# Patient Record
Sex: Male | Born: 1960 | Race: White | Hispanic: No | Marital: Married | Smoking: Current every day smoker
Health system: Southern US, Community
[De-identification: ages and names within clinical notes are randomized; demographics above are authoritative.]

## PROBLEM LIST (undated history)

## (undated) DIAGNOSIS — I10 Essential (primary) hypertension: Secondary | ICD-10-CM

## (undated) DIAGNOSIS — R079 Chest pain, unspecified: Secondary | ICD-10-CM

## (undated) DIAGNOSIS — J9620 Acute and chronic respiratory failure, unspecified whether with hypoxia or hypercapnia: Secondary | ICD-10-CM

## (undated) DIAGNOSIS — M1990 Unspecified osteoarthritis, unspecified site: Secondary | ICD-10-CM

## (undated) DIAGNOSIS — F419 Anxiety disorder, unspecified: Secondary | ICD-10-CM

## (undated) DIAGNOSIS — J45909 Unspecified asthma, uncomplicated: Secondary | ICD-10-CM

## (undated) DIAGNOSIS — F32A Depression, unspecified: Secondary | ICD-10-CM

## (undated) DIAGNOSIS — M542 Cervicalgia: Secondary | ICD-10-CM

## (undated) DIAGNOSIS — F329 Major depressive disorder, single episode, unspecified: Secondary | ICD-10-CM

## (undated) DIAGNOSIS — M545 Low back pain, unspecified: Secondary | ICD-10-CM

## (undated) DIAGNOSIS — E7800 Pure hypercholesterolemia, unspecified: Secondary | ICD-10-CM

## (undated) DIAGNOSIS — M75122 Complete rotator cuff tear or rupture of left shoulder, not specified as traumatic: Secondary | ICD-10-CM

## (undated) DIAGNOSIS — Z972 Presence of dental prosthetic device (complete) (partial): Secondary | ICD-10-CM

## (undated) DIAGNOSIS — M722 Plantar fascial fibromatosis: Secondary | ICD-10-CM

## (undated) DIAGNOSIS — K219 Gastro-esophageal reflux disease without esophagitis: Secondary | ICD-10-CM

## (undated) DIAGNOSIS — R05 Cough: Secondary | ICD-10-CM

## (undated) DIAGNOSIS — J449 Chronic obstructive pulmonary disease, unspecified: Secondary | ICD-10-CM

## (undated) DIAGNOSIS — K429 Umbilical hernia without obstruction or gangrene: Secondary | ICD-10-CM

## (undated) DIAGNOSIS — G8929 Other chronic pain: Secondary | ICD-10-CM

## (undated) DIAGNOSIS — R059 Cough, unspecified: Secondary | ICD-10-CM

## (undated) HISTORY — DX: Major depressive disorder, single episode, unspecified: F32.9

## (undated) HISTORY — PX: CERVICAL DISCECTOMY: SHX98

## (undated) HISTORY — DX: Gastro-esophageal reflux disease without esophagitis: K21.9

## (undated) HISTORY — DX: Acute and chronic respiratory failure, unspecified whether with hypoxia or hypercapnia: J96.20

## (undated) HISTORY — DX: Chest pain, unspecified: R07.9

## (undated) HISTORY — DX: Cough: R05

## (undated) HISTORY — PX: COLONOSCOPY: SHX174

## (undated) HISTORY — PX: HERNIA REPAIR: SHX51

## (undated) HISTORY — PX: GROIN EXPLORATION: SHX1713

## (undated) HISTORY — DX: Umbilical hernia without obstruction or gangrene: K42.9

## (undated) HISTORY — DX: Cervicalgia: M54.2

## (undated) HISTORY — DX: Essential (primary) hypertension: I10

## (undated) HISTORY — DX: Cough, unspecified: R05.9

## (undated) HISTORY — DX: Plantar fascial fibromatosis: M72.2

## (undated) HISTORY — DX: Depression, unspecified: F32.A

---

## 2001-02-25 ENCOUNTER — Encounter: Payer: Self-pay | Admitting: Emergency Medicine

## 2001-02-25 ENCOUNTER — Emergency Department (HOSPITAL_COMMUNITY): Admission: EM | Admit: 2001-02-25 | Discharge: 2001-02-25 | Payer: Self-pay | Admitting: Emergency Medicine

## 2001-03-06 ENCOUNTER — Encounter: Payer: Self-pay | Admitting: Sports Medicine

## 2001-03-06 ENCOUNTER — Ambulatory Visit (HOSPITAL_COMMUNITY): Admission: RE | Admit: 2001-03-06 | Discharge: 2001-03-06 | Payer: Self-pay | Admitting: Sports Medicine

## 2001-03-12 ENCOUNTER — Encounter: Payer: Self-pay | Admitting: Neurosurgery

## 2001-03-12 ENCOUNTER — Ambulatory Visit (HOSPITAL_COMMUNITY): Admission: RE | Admit: 2001-03-12 | Discharge: 2001-03-13 | Payer: Self-pay | Admitting: Neurosurgery

## 2003-02-05 ENCOUNTER — Emergency Department (HOSPITAL_COMMUNITY): Admission: EM | Admit: 2003-02-05 | Discharge: 2003-02-05 | Payer: Self-pay | Admitting: Emergency Medicine

## 2003-12-22 ENCOUNTER — Emergency Department (HOSPITAL_COMMUNITY): Admission: EM | Admit: 2003-12-22 | Discharge: 2003-12-22 | Payer: Self-pay | Admitting: Family Medicine

## 2003-12-24 ENCOUNTER — Emergency Department (HOSPITAL_COMMUNITY): Admission: EM | Admit: 2003-12-24 | Discharge: 2003-12-24 | Payer: Self-pay | Admitting: Emergency Medicine

## 2005-06-05 ENCOUNTER — Emergency Department (HOSPITAL_COMMUNITY): Admission: EM | Admit: 2005-06-05 | Discharge: 2005-06-05 | Payer: Self-pay | Admitting: Emergency Medicine

## 2005-06-28 ENCOUNTER — Emergency Department (HOSPITAL_COMMUNITY): Admission: EM | Admit: 2005-06-28 | Discharge: 2005-06-28 | Payer: Self-pay | Admitting: Family Medicine

## 2006-10-02 ENCOUNTER — Emergency Department (HOSPITAL_COMMUNITY): Admission: EM | Admit: 2006-10-02 | Discharge: 2006-10-02 | Payer: Self-pay | Admitting: Family Medicine

## 2006-10-19 ENCOUNTER — Emergency Department (HOSPITAL_COMMUNITY): Admission: EM | Admit: 2006-10-19 | Discharge: 2006-10-19 | Payer: Self-pay | Admitting: Family Medicine

## 2008-07-24 ENCOUNTER — Emergency Department (HOSPITAL_COMMUNITY): Admission: EM | Admit: 2008-07-24 | Discharge: 2008-07-24 | Payer: Self-pay | Admitting: Family Medicine

## 2009-01-17 ENCOUNTER — Emergency Department (HOSPITAL_COMMUNITY): Admission: EM | Admit: 2009-01-17 | Discharge: 2009-01-17 | Payer: Self-pay | Admitting: Emergency Medicine

## 2010-02-04 ENCOUNTER — Emergency Department (HOSPITAL_COMMUNITY)
Admission: EM | Admit: 2010-02-04 | Discharge: 2010-02-04 | Payer: Self-pay | Source: Home / Self Care | Admitting: Emergency Medicine

## 2010-02-04 LAB — URINALYSIS, ROUTINE W REFLEX MICROSCOPIC
Bilirubin Urine: NEGATIVE
Hemoglobin, Urine: NEGATIVE
Ketones, ur: NEGATIVE mg/dL
Nitrite: NEGATIVE
Protein, ur: NEGATIVE mg/dL
Specific Gravity, Urine: 1.01 (ref 1.005–1.030)
Urine Glucose, Fasting: NEGATIVE mg/dL
Urobilinogen, UA: 0.2 mg/dL (ref 0.0–1.0)
pH: 6 (ref 5.0–8.0)

## 2010-02-04 LAB — CBC
HCT: 49.8 % (ref 39.0–52.0)
Hemoglobin: 17.2 g/dL — ABNORMAL HIGH (ref 13.0–17.0)
MCH: 31.9 pg (ref 26.0–34.0)
MCHC: 34.5 g/dL (ref 30.0–36.0)
MCV: 92.4 fL (ref 78.0–100.0)
Platelets: 200 10*3/uL (ref 150–400)
RBC: 5.39 MIL/uL (ref 4.22–5.81)
RDW: 12.7 % (ref 11.5–15.5)
WBC: 8.6 10*3/uL (ref 4.0–10.5)

## 2010-02-04 LAB — BASIC METABOLIC PANEL
BUN: 13 mg/dL (ref 6–23)
CO2: 27 mEq/L (ref 19–32)
Calcium: 9.8 mg/dL (ref 8.4–10.5)
Chloride: 102 mEq/L (ref 96–112)
Creatinine, Ser: 0.93 mg/dL (ref 0.4–1.5)
GFR calc Af Amer: >60 mL/min (ref >60)
GFR calc non Af Amer: >60 mL/min (ref >60)
Glucose, Bld: 76 mg/dL (ref 70–99)
Potassium: 4.4 mEq/L (ref 3.5–5.1)
Sodium: 137 mEq/L (ref 135–145)

## 2010-02-04 LAB — DIFFERENTIAL
Basophils Absolute: 0 10*3/uL (ref 0.0–0.1)
Basophils Relative: 0 % (ref 0–1)
Eosinophils Absolute: 0.3 10*3/uL (ref 0.0–0.7)
Eosinophils Relative: 4 % (ref 0–5)
Lymphocytes Relative: 31 % (ref 12–46)
Lymphs Abs: 2.7 10*3/uL (ref 0.7–4.0)
Monocytes Absolute: 0.9 10*3/uL (ref 0.1–1.0)
Monocytes Relative: 10 % (ref 3–12)
Neutro Abs: 4.7 10*3/uL (ref 1.7–7.7)
Neutrophils Relative %: 55 % (ref 43–77)

## 2010-02-04 LAB — POCT CARDIAC MARKERS
CKMB, poc: 1.9 ng/mL (ref 1.0–8.0)
Myoglobin, poc: 64 ng/mL (ref 12–200)
Troponin i, poc: <0.05 ng/mL (ref 0.00–0.09)

## 2010-02-15 DIAGNOSIS — M549 Dorsalgia, unspecified: Secondary | ICD-10-CM | POA: Insufficient documentation

## 2010-02-15 DIAGNOSIS — R079 Chest pain, unspecified: Secondary | ICD-10-CM | POA: Insufficient documentation

## 2010-02-16 ENCOUNTER — Encounter (INDEPENDENT_AMBULATORY_CARE_PROVIDER_SITE_OTHER): Payer: Self-pay | Admitting: *Deleted

## 2010-02-16 ENCOUNTER — Ambulatory Visit
Admission: RE | Admit: 2010-02-16 | Discharge: 2010-02-16 | Payer: Self-pay | Source: Home / Self Care | Attending: Cardiovascular Disease | Admitting: Cardiovascular Disease

## 2010-02-16 ENCOUNTER — Encounter: Payer: Self-pay | Admitting: Cardiovascular Disease

## 2010-02-16 DIAGNOSIS — J9620 Acute and chronic respiratory failure, unspecified whether with hypoxia or hypercapnia: Secondary | ICD-10-CM | POA: Insufficient documentation

## 2010-02-16 DIAGNOSIS — Z720 Tobacco use: Secondary | ICD-10-CM | POA: Insufficient documentation

## 2010-02-17 ENCOUNTER — Telehealth (INDEPENDENT_AMBULATORY_CARE_PROVIDER_SITE_OTHER): Payer: Self-pay | Admitting: *Deleted

## 2010-02-19 ENCOUNTER — Ambulatory Visit (HOSPITAL_COMMUNITY)
Admission: RE | Admit: 2010-02-19 | Discharge: 2010-02-19 | Payer: Self-pay | Source: Home / Self Care | Attending: Cardiovascular Disease | Admitting: Cardiovascular Disease

## 2010-02-22 ENCOUNTER — Telehealth: Payer: Self-pay | Admitting: Cardiovascular Disease

## 2010-03-04 NOTE — Miscellaneous (Signed)
Summary: Orders Update  Clinical Lists Changes  Orders: Added new Referral order of Cardiac CTA (Cardiac CTA) - Signed 

## 2010-03-04 NOTE — Progress Notes (Signed)
  DDS Request received sent to [facility]  February 17, 2010 10:35 AM

## 2010-03-04 NOTE — Progress Notes (Signed)
Summary: pt rtn call  Phone Note Call from Patient Call back at Home Phone [phone]   Caller: Patient Reason for Call: Talk to Nurse, Talk to Doctor Summary of Call: pt rtn call Initial call taken by: Omer Jack,  February 22, 2010 10:41 AM  Follow-up for Phone Call        Pt. states he is returning a call. Some one from this office had called him this am.  Cardiac Ct results given. Pt. states he still is having some pain on his chest, also he continues to smoke. Pt. was made  aware of the nicotine side effects. Pt. states will continue to F/u with Cardiologist. Follow-up by: Ollen Gross, RN, BSN,  February 22, 2010 11:35 AM

## 2010-03-04 NOTE — Assessment & Plan Note (Signed)
Summary: np3/chest pain/selfpay/d/c wl 02/04/10/mj   CC:  pt complains of back and chest pain also unable to sleep..pt states hes not on any meds.  History of Present Illness: 50 yo referred by [facility] ER for SSCP.  Seen 1/5 and R/O with negative enzymes, NAD on CXR and ECG with no acute changes.  Reviewed visit.  Continues to have aytpical pain left sided persistant.  Not always exertional.  Associated with some SOB.  Smokes 1ppd for years.  Discussed smoking cessation for less than 10 minutes and relationship between it and vascular disease.  Poor medical F/U with no primary.  Currently not working and sedentary. Discussed Promise Trial with him.  Willing to be randomized to stress echo or CT  Current Problems (verified): 1)  Chest Pain  (ICD-786.50) 2)  Back Pain  (ICD-724.5)  Current Medications (verified): 1)  None  Allergies (verified): No Known Drug Allergies  Past History:  Past Medical History: Last updated: 02/15/2010 CHEST PAIN BACK PAIN   Past Surgical History: Last updated: 02/15/2010 cervical disk surgery  Family History: Last updated: 02/16/2010  CAD  premature fathers side  Social History: Last updated: 02/16/2010  former drug abuse, cigarette smoker  Unemployed Lives with wife Sedentary  Family History:  CAD  premature fathers side  Social History:  former drug abuse, cigarette smoker  Unemployed Lives with wife Sedentary  Review of Systems       Denies fever, malais, weight loss, blurry vision, decreased visual acuity, cough, sputum,, hemoptysis, pleuritic pain, palpitaitons, heartburn, abdominal pain, melena, lower extremity edema, claudication, or rash.   Vital Signs:  Patient profile:   50 year old male Height:      64 inches Weight:      168 pounds BMI:     28.94 Pulse rate:   78 / minute Resp:     14 per minute BP sitting:   130 / 80  (left arm)  Vitals Entered By: Kem Parkinson (February 16, 2010 3:06 PM)  Physical Exam  General:   Affect appropriate Healthy:  appears stated age HEENT: normal Neck supple with no adenopathy JVP normal no bruits no thyromegaly Lungs clear with no wheezing and good diaphragmatic motion Heart:  S1/S2 no murmur,rub, gallop or click PMI normal Abdomen: benighn, BS positve, no tenderness, no AAA no bruit.  No HSM or HJR Distal pulses intact with no bruits No edema Neuro non-focal Skin warm and dry    Impression & Recommendations:  Problem # 1:  CHEST PAIN (ICD-786.50) Promise trial.  CT vs stress echo.  ECG normal CRF primarily smoking  Problem # 2:  SMOKER (ICD-305.1) No motivation to quit.  Offerred Welbutrin but declined  Problem # 3:  ACUTE AND CHRONIC RESPIRATORY FAILURE (ICD-518.84) Clinical COPD.  No nodules or CA on CXR in ER.  Discussed long term effects of smoking  Should have yearly CXR

## 2010-03-15 ENCOUNTER — Emergency Department (HOSPITAL_COMMUNITY): Payer: Self-pay

## 2010-03-15 ENCOUNTER — Emergency Department (HOSPITAL_COMMUNITY)
Admission: EM | Admit: 2010-03-15 | Discharge: 2010-03-15 | Disposition: A | Discharge status: Home / Self Care | Payer: Self-pay | Attending: Emergency Medicine | Admitting: Emergency Medicine

## 2010-03-15 DIAGNOSIS — R0609 Other forms of dyspnea: Secondary | ICD-10-CM | POA: Insufficient documentation

## 2010-03-15 DIAGNOSIS — R51 Headache: Secondary | ICD-10-CM | POA: Insufficient documentation

## 2010-03-15 DIAGNOSIS — R509 Fever, unspecified: Secondary | ICD-10-CM | POA: Insufficient documentation

## 2010-03-15 DIAGNOSIS — R071 Chest pain on breathing: Secondary | ICD-10-CM | POA: Insufficient documentation

## 2010-03-15 DIAGNOSIS — R0989 Other specified symptoms and signs involving the circulatory and respiratory systems: Secondary | ICD-10-CM | POA: Insufficient documentation

## 2010-03-15 LAB — BASIC METABOLIC PANEL
BUN: 12 mg/dL (ref 6–23)
CO2: 28 mEq/L (ref 19–32)
Calcium: 9.3 mg/dL (ref 8.4–10.5)
Chloride: 105 mEq/L (ref 96–112)
Creatinine, Ser: 0.98 mg/dL (ref 0.4–1.5)
GFR calc Af Amer: >60 mL/min (ref >60)
GFR calc non Af Amer: >60 mL/min (ref >60)
Glucose, Bld: 109 mg/dL — ABNORMAL HIGH (ref 70–99)
Potassium: 4.2 mEq/L (ref 3.5–5.1)
Sodium: 140 mEq/L (ref 135–145)

## 2010-03-15 LAB — DIFFERENTIAL
Basophils Absolute: 0 10*3/uL (ref 0.0–0.1)
Basophils Relative: 1 % (ref 0–1)
Eosinophils Absolute: 0.4 10*3/uL (ref 0.0–0.7)
Eosinophils Relative: 5 % (ref 0–5)
Lymphocytes Relative: 30 % (ref 12–46)
Lymphs Abs: 2.6 10*3/uL (ref 0.7–4.0)
Monocytes Absolute: 0.9 10*3/uL (ref 0.1–1.0)
Monocytes Relative: 10 % (ref 3–12)
Neutro Abs: 4.7 10*3/uL (ref 1.7–7.7)
Neutrophils Relative %: 55 % (ref 43–77)

## 2010-03-15 LAB — CBC
HCT: 46.9 % (ref 39.0–52.0)
Hemoglobin: 16.7 g/dL (ref 13.0–17.0)
MCH: 32.1 pg (ref 26.0–34.0)
MCHC: 35.6 g/dL (ref 30.0–36.0)
MCV: 90.2 fL (ref 78.0–100.0)
Platelets: 186 10*3/uL (ref 150–400)
RBC: 5.2 MIL/uL (ref 4.22–5.81)
RDW: 12.7 % (ref 11.5–15.5)
WBC: 8.6 10*3/uL (ref 4.0–10.5)

## 2010-03-15 LAB — D-DIMER, QUANTITATIVE: D-Dimer, Quant: 0.24 ug/mL-FEU (ref 0.00–0.48)

## 2010-03-15 LAB — LIPASE, BLOOD: Lipase: 21 U/L (ref 11–59)

## 2010-05-03 LAB — LIPASE, BLOOD: Lipase: 22 U/L (ref 11–59)

## 2010-05-03 LAB — COMPREHENSIVE METABOLIC PANEL
ALT: 35 U/L (ref 0–53)
AST: 29 U/L (ref 0–37)
Albumin: 4.1 g/dL (ref 3.5–5.2)
Alkaline Phosphatase: 59 U/L (ref 39–117)
BUN: 10 mg/dL (ref 6–23)
CO2: 28 mEq/L (ref 19–32)
Calcium: 9.2 mg/dL (ref 8.4–10.5)
Chloride: 102 mEq/L (ref 96–112)
Creatinine, Ser: 0.96 mg/dL (ref 0.4–1.5)
GFR calc Af Amer: >60 mL/min (ref >60)
GFR calc non Af Amer: >60 mL/min (ref >60)
Glucose, Bld: 110 mg/dL — ABNORMAL HIGH (ref 70–99)
Potassium: 3.9 mEq/L (ref 3.5–5.1)
Sodium: 137 mEq/L (ref 135–145)
Total Bilirubin: 0.6 mg/dL (ref 0.3–1.2)
Total Protein: 6.7 g/dL (ref 6.0–8.3)

## 2010-05-03 LAB — POCT CARDIAC MARKERS
CKMB, poc: 1.1 ng/mL (ref 1.0–8.0)
Myoglobin, poc: 53.4 ng/mL (ref 12–200)
Troponin i, poc: <0.05 ng/mL (ref 0.00–0.09)

## 2010-05-03 LAB — CBC
HCT: 46.6 % (ref 39.0–52.0)
Hemoglobin: 16.2 g/dL (ref 13.0–17.0)
MCHC: 34.8 g/dL (ref 30.0–36.0)
MCV: 93.4 fL (ref 78.0–100.0)
Platelets: 187 10*3/uL (ref 150–400)
RBC: 4.99 MIL/uL (ref 4.22–5.81)
RDW: 13.7 % (ref 11.5–15.5)
WBC: 7.4 10*3/uL (ref 4.0–10.5)

## 2010-05-03 LAB — DIFFERENTIAL
Basophils Absolute: 0 10*3/uL (ref 0.0–0.1)
Basophils Relative: 1 % (ref 0–1)
Eosinophils Absolute: 0.3 10*3/uL (ref 0.0–0.7)
Eosinophils Relative: 3 % (ref 0–5)
Lymphocytes Relative: 29 % (ref 12–46)
Lymphs Abs: 2.2 10*3/uL (ref 0.7–4.0)
Monocytes Absolute: 0.9 10*3/uL (ref 0.1–1.0)
Monocytes Relative: 12 % (ref 3–12)
Neutro Abs: 4.1 10*3/uL (ref 1.7–7.7)
Neutrophils Relative %: 55 % (ref 43–77)

## 2010-05-03 LAB — URINALYSIS, ROUTINE W REFLEX MICROSCOPIC
Bilirubin Urine: NEGATIVE
Ketones, ur: NEGATIVE mg/dL
Nitrite: NEGATIVE
Protein, ur: NEGATIVE mg/dL
pH: 6 (ref 5.0–8.0)

## 2010-06-18 NOTE — H&P (Signed)
[place]. [facility]  Patient:    Vernon Carpenter, Vernon Carpenter Visit Number: [id number] MRN: [id number]          Service Type: DSU Location: [facility] [id number] Attending Physician:  Coletta Memos Dictated by:   Mena Goes. Franky Macho, M.D. Admit Date:  03/12/2001                           History and Physical  ADMITTING DIAGNOSIS:  Displaced disk left C6-7, left C7 radiculopathy.  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  Over the last two to three weeks he has had significant pain in his left upper extremity and shoulder.  It appeared the pain started after a knife broke while he was working on some drywall.  He has had no bowel or bladder dysfunction.  Does have some weakness in his arms.  He is currently taking prednisone and Vicodin for pain.  PAST MEDICAL HISTORY:  Excellent.  He has had no previous operations.  FAMILY HISTORY:  Not provided.  SOCIAL HISTORY:  He does smoke one pack of cigarettes a day.  Does not drink alcohol or use illicit drugs.  He is 5 feet 4 inches tall and weighs 115 pounds.  REVIEW OF SYSTEMS:  Positive for arm and shoulder pain.  He denied constitutional, eye, ear, nose, throat, mouth, cardiovascular, respiratory, gastrointestinal, genitourinary, skin, neurological, psychiatric, endocrine, and hematologic problems.  PHYSICAL EXAMINATION:  VITAL SIGNS:  Pulse 72.  GENERAL:  He is alert and oriented x 4 and answers all questions appropriately.  Memory, language, attention span, and fund of knowledge are normal.  He is well-kempt and in no distress.  NEUROLOGIC:  Pupils are equal, round and reactive to light.  He has full extraocular movements.  Normal funduscopic exam.  Visual fields are normal. He has symmetric facial sensation and movements.  Hearing intact to finger rub and voice bilaterally.  Uvula elevates in the midline.  Shoulder shrug is normal.  Tongue protrudes in the midline.  He has 5/5  strength in the upper extremities with the exception of 4/5 strength in the left triceps and left wrist extensors.  Proprioception and pinprick are intact.  No clonus. Reflexes 2+ at the biceps, triceps, brachioradialis, knees, and ankles.  Toes are downgoing to plantar stimulation and he is without clonus.  Romberg test is negative and his gait is normal.  He can tandem walk easily.  NECK:  There are no cervical masses or bruits appreciated.  LUNGS:  Lung fields are clear.  HEART:  Regular rhythm and rate, no murmurs or rubs.  Pulses are good at the wrists and feet bilaterally.  LABORATORY DATA:  MRI shows a large herniated disk at C6-7 on the left side causing cord distortion and closing off the neural foramen.  Alignment shows some slight loss of lordosis and there are no abnormalities in the paraspinous soft tissue.  IMPRESSION:  Vernon Carpenter is a young man who works in [employer].  He has not been able to work secondary to the significant amount of pain he is in.  While he was in my office he could barely leave his left arm unheld, as it hurts him to let it dangle.  I think he would be much better with operative removal of the disk and anterior plating.  Risks of the procedure including bleeding, infection, no pain relief, bowel or bladder dysfunction, need for further surgery were discussed.  He understands and wishes to proceed. Dictated by:   Mena Goes. Franky Macho, M.D. Attending Physician:  Coletta Memos DD:  03/12/01 TD:  03/12/01 Job: [id number]

## 2010-06-18 NOTE — Op Note (Signed)
[place]. [facility]  Patient:    Vernon Carpenter, Vernon Carpenter Visit Number: [id number] MRN: [id number]          Service Type: DSU Location: 3000 3007 01 Attending Physician:  Coletta Memos Dictated by:   Mena Goes. Franky Macho, M.D. Proc. Date: 03/12/01 Admit Date:  03/12/2001                             Operative Report  PREOPERATIVE DIAGNOSIS:  Displaced disk left C6-7, left C7 radiculopathy.  POSTOPERATIVE DIAGNOSIS:  Displaced disk left C6-7, left C7 radiculopathy.  PROCEDURE:  Anterior cervical diskectomy and arthrodesis C6-7 with small stature Synthesis plate.  A 7 mm ______ bone used.  COMPLICATIONS:  None.  INDICATIONS:  Vernon Carpenter is a 50 year old with pain in the left upper extremity due to a displaced disk at C6-7.  He shows weakness in the left C7 distribution.  I recommended and he has agreed to undergo operative removal of the disk and subsequently arthrodesis.  DESCRIPTION OF PROCEDURE:  Mr. Hillery was brought to the operating room, intubated, and placed under general anesthesia without difficulty.  His head was placed essentially in a neutral position with 10 pounds of traction applied to the chin strap while on a horseshoe headrest.  His neck was prepped and he was draped in a sterile fashion.  I infiltrated 4 cc of 0.5% lidocaine, 1:200,000 strength epinephrine in my proposed incision site.  I made the incision with a #10-blade and took this down through the skin to the platysma. I dissected above the platysma both rostrally and caudally.  I then opened the platysma in a horizontal fashion using the Metzenbaum scissors.  I then created a plane between the sternocleidomastoid and the medial strap muscles and was able to expose the anterior cervical spine.  I took an x-ray after placing a needle in the disk space that I had exposed and it was at C6-7. After the x-ray was performed, I reflected the longus colli muscles laterally using monopolar  cautery.  I placed a self-retaining Caspar retractor.  I then placed two distraction screws, one at C6 and one in C7 and opened the disk space, and distracted the disk space.  I used a 15-blade to open the disk space and start the removal of the disk.  I then used curets and pituitary rongeurs to remove more soft tissue and disk material.  The microscope was brought into the operative field with Dr. Wylene Men assistance, I then drilled a portion of the disk space to remove osteophytes and expose the C7 nerve roots bilaterally.  A large disk herniation was then removed, as there were fragments present in the left neural foramen.  When I was done, the posterior longitudinal ligament was removed to expose the thecal sac.  This was to ensure that there were no remaining disk fragments left.  One side was sure the decompression, I irrigated the disk space.  I then placed a 7 mm ______ graft into the disk space after removing a portion of the end of the graft. This was done without difficulty.  I removed the microscope, then placed the 14 mm plate.  I placed two screws in C6, two screws in C7 after drilling and tapping each screw site.  After the x-ray was taken of the plate and screws, was not satisfied with the right C6 screw, so I removed that, and replaced it with a rescue screw on the  right side.  Subsequent x-ray showed the screw to be in a better position.  I irrigated once more.  I closed the wound in layered fashion using Vicryl sutures to reapproximate the platysma, then subcutaneous layers.  Dermabond was used for a sterile dressing.  The patient was extubated, moving all extremities, and tolerated the procedure without difficulty. Dictated by:   Mena Goes. Franky Macho, M.D. Attending Physician:  Coletta Memos DD:  03/12/01 TD:  03/12/01 Job: [id number]

## 2011-04-15 ENCOUNTER — Encounter: Payer: Self-pay | Admitting: Internal Medicine

## 2011-04-15 ENCOUNTER — Ambulatory Visit (INDEPENDENT_AMBULATORY_CARE_PROVIDER_SITE_OTHER): Payer: Self-pay | Admitting: Internal Medicine

## 2011-04-15 VITALS — BP 131/85 | HR 77 | Temp 97.0°F | Wt 167.5 lb

## 2011-04-15 DIAGNOSIS — F17200 Nicotine dependence, unspecified, uncomplicated: Secondary | ICD-10-CM

## 2011-04-15 DIAGNOSIS — M549 Dorsalgia, unspecified: Secondary | ICD-10-CM

## 2011-04-15 DIAGNOSIS — M722 Plantar fascial fibromatosis: Secondary | ICD-10-CM

## 2011-04-15 MED ORDER — MELOXICAM 15 MG PO TABS: 15.0000 mg | ORAL_TABLET | Freq: Every day | ORAL | Status: DC

## 2011-04-15 MED ORDER — PREDNISONE 10 MG PO TABS: ORAL_TABLET | ORAL | Status: DC

## 2011-04-15 NOTE — Patient Instructions (Signed)
Start taking prednisone as prescribed Take Mobic (meloxicam) 15mg  one tablet daily Follow up with Dr. Anselm Jungling in 1 month

## 2011-04-15 NOTE — Progress Notes (Signed)
HPI: [name] is a 51 yo M with PMH of clinical dx of COPD, chest pain with neg CT cardiac 2013 presents today to establish care.  He complains of chronic back pain for many years.  It is hard for him to sleep at night and has anxiety during the day.  Was given Ibuprofen and Flexeril but do not really help, make him more jittery and kept him up all night.  Not able to do things around the house because of pain so he just sits in his recliner all day.  Pain is 8/10 in severity, lower back , sharp in quality, no radiation. Also c/o of left leg x 2 yrs.  His heel hurt more than the rest of the legs. Used to wear metal boots for work which is when his heel started to hurt.  But basically his whole body hurts.  Still has chest pain on left nonexertional, tightness 5-10 seconds, and SOB.  Can walk only 3 steps before he feels SOB.   Xray of lumbar in 2008: Mild multilevel degenerative disc disease changes with endplate spur formation.  He does not have insurance nor orange card so not able to get imagings, UDS, or PT, or labs   ROS: as per HPI  PE: General: alert, well-developed, and cooperative to examination.  Lungs: normal respiratory effort, no accessory muscle use, normal breath sounds, no crackles, and no wheezes. Heart: normal rate, regular rhythm, no murmur, no gallop, and no rub.  Abdomen: soft, non-tender, normal bowel sounds, slightly distention, no guarding, no rebound tenderness Msk: no joint swelling, no joint warmth, and no redness over joints. Tenderness to palpation of the lower back. Left heel: tenderness to palpation Pulses: 2+ DP/PT pulses bilaterally Extremities: No cyanosis, clubbing, edema Neurologic: nonfocal, moving all 4 extremities

## 2011-04-15 NOTE — Assessment & Plan Note (Signed)
I discussed the option of steroid injection vs. Oral therapy and patient opted for oral prednisone -Start Prednisone taper dose x 10 days  -May use ice pad -Mobic 15mg  qd for anti-inflammation -Re-evaluate in 1 month

## 2011-04-15 NOTE — Assessment & Plan Note (Addendum)
He continues to smoke about 1/2 ppd.  Again, I counseled patient on smoking cessation which is a huge risk factor for CAD.  He states that he has the patches at home and will start using it this week.

## 2011-04-15 NOTE — Assessment & Plan Note (Addendum)
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  He does not have any symptoms of nerve impingement at this time, no radiation/numbness or tingling.  He has tried Tramadol, Vicodin, and Percocet which helps with the pain; however, with his hx of drug abuse listed in epic (although he denies any hx of drug abuse to me), I am hesitant to start him on a narcotics since this is the first time I see this patient.  I would try NSAIDs which would help decrease inflammation first and then as soon as he gets his orange card, I will send him to physical therapy.  As noted, he does not have any medical insurance so will not get Xray today.  If pain is still not controlled, we may need to escalate his pain med. -Prescribed Mobic 15mg  po qd -Will send to physical therapy next visit -May need to UDS but he will not be able to pay the cost

## 2011-05-10 ENCOUNTER — Encounter: Payer: Self-pay | Admitting: Internal Medicine

## 2011-05-31 ENCOUNTER — Emergency Department (HOSPITAL_COMMUNITY)
Admission: EM | Admit: 2011-05-31 | Discharge: 2011-05-31 | Disposition: A | Discharge status: Home / Self Care | Payer: Self-pay | Attending: Emergency Medicine | Admitting: Emergency Medicine

## 2011-05-31 ENCOUNTER — Emergency Department (HOSPITAL_COMMUNITY): Payer: Self-pay

## 2011-05-31 DIAGNOSIS — M545 Low back pain, unspecified: Secondary | ICD-10-CM | POA: Insufficient documentation

## 2011-05-31 DIAGNOSIS — M549 Dorsalgia, unspecified: Secondary | ICD-10-CM

## 2011-05-31 DIAGNOSIS — R109 Unspecified abdominal pain: Secondary | ICD-10-CM | POA: Insufficient documentation

## 2011-05-31 DIAGNOSIS — R079 Chest pain, unspecified: Secondary | ICD-10-CM | POA: Insufficient documentation

## 2011-05-31 DIAGNOSIS — M79609 Pain in unspecified limb: Secondary | ICD-10-CM | POA: Insufficient documentation

## 2011-05-31 MED ORDER — NAPROXEN 500 MG PO TABS: 500.0000 mg | ORAL_TABLET | Freq: Two times a day (BID) | ORAL | Status: DC

## 2011-05-31 NOTE — ED Notes (Signed)
C/O low back pain "for a while" but feels like a knife stabbing w/pain down my left leg."  Denies know injury.   Also C/O chest tightness, comes and goes "for a long time but I don't have a primary doctor so I'd like it checked out"

## 2011-05-31 NOTE — Discharge Instructions (Signed)
Chronic Back Pain When back pain lasts longer than 3 months, it is called chronic back pain.This pain can be frustrating, but the cause of the pain is rarely dangerous.People with chronic back pain often go through certain periods that are more intense (flare-ups). CAUSES Chronic back pain can be caused by wear and tear (degeneration) on different structures in your back. These structures may include bones, ligaments, or discs. This degeneration may result in more pressure being placed on the nerves that travel to your legs and feet. This can lead to pain traveling from the low back down the back of the legs. When pain lasts longer than 3 months, it is not unusual for people to experience anxiety or depression. Anxiety and depression can also contribute to low back pain. TREATMENT  Establish a regular exercise plan. This is critical to improving your functional level.   Have a self-management plan for when you flare-up. Flare-ups rarely require a medical visit. Regular exercise will help reduce the intensity and frequency of your flare-ups.   Manage how you feel about your back pain and the rest of your life. Anxiety, depression, and feeling that you cannot alter your back pain have been shown to make back pain more intense and debilitating.   Medicines should never be your only treatment. They should be used along with other treatments to help you return to a more active lifestyle.   Procedures such as injections or surgery may be helpful but are rarely necessary. You may be able to get the same results with physical therapy or chiropractic care.  HOME CARE INSTRUCTIONS  Avoid bending, heavy lifting, prolonged sitting, and activities which make the problem worse.   Continue normal activity as much as possible.   Take brief periods of rest throughout the day to reduce your pain during flare-ups.   Follow your back exercise rehabilitation program. This can help reduce symptoms and prevent  more pain.   Only take over-the-counter or prescription medicines as directed by your caregiver. Muscle relaxants are sometimes prescribed. Narcotic pain medicine is discouraged for long-term pain, since addiction is a possible outcome.   If you smoke, quit.   Eat healthy foods and maintain a recommended body weight.  SEEK IMMEDIATE MEDICAL CARE IF:   You have weakness or numbness in one of your legs or feet.   You have trouble controlling your bladder or bowels.   You develop nausea, vomiting, abdominal pain, shortness of breath, or fainting.  Document Released: 02/25/2004 Document Revised: 01/06/2011 Document Reviewed: 01/01/2011 [facility] Patient Information 2012 [place], [place].  Chest Pain (Nonspecific) Chest pain has many causes. Your pain could be caused by something serious, such as a heart attack or a blood clot in the lungs. It could also be caused by something less serious, such as a chest bruise or a virus. Follow up with your doctor. More lab tests or other studies may be needed to find the cause of your pain. Most of the time, nonspecific chest pain will improve within 2 to 3 days of rest and mild pain medicine. HOME CARE  For chest bruises, you may put ice on the sore area for 15 to 20 minutes, 3 to 4 times a day. Do this only if it makes you or your child feel better.   Put ice in a plastic bag.   Place a towel between the skin and the bag.   Rest for the next 2 to 3 days.   Go back to work if the  pain improves.   See your doctor if the pain lasts longer than 1 to 2 weeks.   Only take medicine as told by your doctor.   Quit smoking if you smoke.  GET HELP RIGHT AWAY IF:   There is more pain or pain that spreads to the arm, neck, jaw, back, or belly (abdomen).   You or your child has shortness of breath.   You or your child coughs more than usual or coughs up blood.   You or your child has very bad back or belly pain, feels sick to his or her stomach  (nauseous), or throws up (vomits).   You or your child has very bad weakness.   You or your child passes out (faints).   You or your child has a temperature by mouth above 102 F (38.9 C), not controlled by medicine.  Any of these problems may be serious and may be an emergency. Do not wait to see if the problems will go away. Get medical help right away. Call your local emergency services 911 in [place] Do not drive yourself to the hospital. MAKE SURE YOU:   Understand these instructions.   Will watch this condition.   Will get help right away if you or your child is not doing well or gets worse.  Document Released: 07/06/2007 Document Revised: 01/06/2011 Document Reviewed: 07/06/2007 [facility] Patient Information 2012 [place], [place].

## 2011-05-31 NOTE — ED Provider Notes (Signed)
History     CSN: [id number]  Arrival date & time 05/31/11  1627   First MD Initiated Contact with Patient 05/31/11 1804      Chief Complaint  Patient presents with  . Back Pain  . Chest Pain    (Consider location/radiation/quality/duration/timing/severity/associated sxs/prior treatment) HPI  51 year old male presents with a chief complaint of back pain. 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 Pain is constant, sharp in sensation, or worsening with positional changes. States she is usually able to tolerate pain okay but since yesterday his pain has worsen. He also complaining of pain radiates down to his left leg. Pain is radiates to the back of his leg and also around his abdomen. Patient denies fever, rash, urinary symptoms, numbness or weakness. He denies any precipitating factor.  Patient also complaining of intermittent sharp chest pain has been ongoing for many years. States he would occasionally experiencing a sharp sensation to his left chest lasting for seconds. Pain usually comes and goes. Nothing makes it worse, nothing makes it better. Occasionally he noticed the pain when he bent down to reach for something. He denies associated shortness of breath, radiating pain, diaphoresis, or trauma. Patient is a smoker and does have family history of heart disease. Patient denies history of heart disease. Patient denies chest pain currently. Patient states since he is here for back pain he would like to ask about his chest pain. Patient does have a family doctor.  No past medical history on file.  No past surgical history on file.  No family history on file.  History  Substance Use Topics  . Smoking status: Current Everyday Smoker -- 0.5 packs/day    Types: Cigarettes  . Smokeless tobacco: Not on file  . Alcohol Use: Not on file      Review of Systems  All other systems reviewed and are negative.    Allergies  Review of  patient's allergies indicates no known allergies.  Home Medications   Current Outpatient Rx  Name Route Sig Dispense Refill  . MELOXICAM 15 MG PO TABS Oral Take 1 tablet (15 mg total) by mouth daily. 90 tablet 1    BP 143/90  Pulse 73  Temp(Src) 98.3 F (36.8 C) (Oral)  Resp 16  Ht 5\' 4"  (1.626 m)  Wt 169 lb (76.658 kg)  BMI 29.01 kg/m2  SpO2 96%  Physical Exam  Nursing note and vitals reviewed. Constitutional: He appears well-developed and well-nourished. No distress.       Awake, alert, nontoxic appearance  HENT:  Head: Atraumatic.  Eyes: Conjunctivae are normal. Right eye exhibits no discharge. Left eye exhibits no discharge.  Neck: Normal range of motion. Neck supple.  Cardiovascular: Normal rate and regular rhythm.   Pulmonary/Chest: Effort normal. No respiratory distress. He has wheezes. He has no rales. He exhibits no tenderness.  Abdominal: Soft. There is no tenderness. There is no rebound.  Musculoskeletal: He exhibits no tenderness.       ROM appears intact, no obvious focal weakness.  paravertebral tenderness to L lower back on palpation, no obvious deformity or overlying skin changes noted.    Neurological: He is alert.  Skin: Skin is warm and dry. No rash noted.  Psychiatric: He has a normal mood and affect.    ED Course  Procedures (including critical care time)  Labs Reviewed - No data to display No results found.   No diagnosis found.   Date: 05/31/2011  Rate: 69  Rhythm: normal sinus rhythm  QRS Axis: normal  Intervals: normal  ST/T Wave abnormalities: normal  Conduction Disutrbances:none  Narrative Interpretation:   Old EKG Reviewed: unchanged   Results for orders placed during the hospital encounter of 03/15/10  DIFFERENTIAL      Component Value Range   Neutrophils Relative 55  43 - 77 (%)   Neutro Abs 4.7  1.7 - 7.7 (K/uL)   Lymphocytes Relative 30  12 - 46 (%)   Lymphs Abs 2.6  0.7 - 4.0 (K/uL)   Monocytes Relative 10  3 - 12 (%)    Monocytes Absolute 0.9  0.1 - 1.0 (K/uL)   Eosinophils Relative 5  0 - 5 (%)   Eosinophils Absolute 0.4  0.0 - 0.7 (K/uL)   Basophils Relative 1  0 - 1 (%)   Basophils Absolute 0.0  0.0 - 0.1 (K/uL)  CBC      Component Value Range   WBC 8.6  4.0 - 10.5 (K/uL)   RBC 5.20  4.22 - 5.81 (MIL/uL)   Hemoglobin 16.7  13.0 - 17.0 (g/dL)   HCT 84.6  96.2 - 95.2 (%)   MCV 90.2  78.0 - 100.0 (fL)   MCH 32.1  26.0 - 34.0 (pg)   MCHC 35.6  30.0 - 36.0 (g/dL)   RDW 84.1  32.4 - 40.1 (%)   Platelets 186  150 - 400 (K/uL)  D-DIMER, QUANTITATIVE      Component Value Range   D-Dimer, Quant    0.00 - 0.48 (ug/mL-FEU)   Value: 0.24            AT THE INHOUSE ESTABLISHED CUTOFF     VALUE OF 0.48 ug/mL FEU,     THIS ASSAY HAS BEEN DOCUMENTED     IN THE LITERATURE TO HAVE     A SENSITIVITY AND NEGATIVE     PREDICTIVE VALUE OF AT LEAST     98 TO 99%.  THE TEST RESULT     SHOULD BE CORRELATED WITH     AN ASSESSMENT OF THE CLINICAL     PROBABILITY OF DVT / VTE.  BASIC METABOLIC PANEL      Component Value Range   Sodium 140  135 - 145 (mEq/L)   Potassium 4.2  3.5 - 5.1 (mEq/L)   Chloride 105  96 - 112 (mEq/L)   CO2 28  19 - 32 (mEq/L)   Glucose, Bld 109 (*) 70 - 99 (mg/dL)   BUN 12  6 - 23 (mg/dL)   Creatinine, Ser 0.27  0.4 - 1.5 (mg/dL)   Calcium 9.3  8.4 - 25.3 (mg/dL)   GFR calc non Af Amer >60  >60 (mL/min)   GFR calc Af Amer    >60 (mL/min)   Value: >60            The eGFR has been calculated     using the MDRD equation.     This calculation has not been     validated in all clinical     situations.     eGFR's persistently     <60 mL/min signify     possible Chronic Kidney Disease.  LIPASE, BLOOD      Component Value Range   Lipase 21  11 - 59 (U/L)   Dg Chest 2 View  05/31/2011  *RADIOLOGY REPORT*  Clinical Data: 50 year old male with pain.  CHEST - 2 VIEW  Comparison: 03/15/2010 and earlier.  Findings: Lung volumes are  stable and within normal limits. Cardiac size and mediastinal  contours are within normal limits. Visualized tracheal air column is within normal limits.  Cervical ACDF hardware.  No pneumothorax, pulmonary edema, pleural effusion or confluent pulmonary opacity. No acute osseous abnormality identified.  IMPRESSION: No acute cardiopulmonary abnormality.  Original Report Authenticated By: Harley Hallmark, M.D.   Dg Lumbar Spine Complete  05/31/2011  *RADIOLOGY REPORT*  Clinical Data: 51 year old male with pain.  LUMBAR SPINE - COMPLETE 4+ VIEW  Comparison: 10/19/2006.  Findings: Vestigial S1-S2 disc space suspected.  Stable lumbar vertebral height and alignment.  Relatively preserved disc spaces. Chronic widespread endplate spurring.  No pars fracture.  Mild to moderate L5-S1 facet hypertrophy.  Negative SI joints.  Sacrum appears stable and intact.  IMPRESSION: No acute osseous abnormality in the lumbar spine.  Original Report Authenticated By: Harley Hallmark, M.D.      MDM  Pt with chronic back pain with no evidence of nerve impingement.  He has normal DTR, no red flag sign.  Does have prior hx of drug abuse, as documented by his PCP.    Pt c/o chest pain.  ECG unremarkable, he has negative chest CT in 2013.  Will defer to PCP for further evaluation.  No cp at this time.     7:24 PM Patient has normal chest x-ray a normal lumbar spine x-ray. I have no suspicion for spinal cord compression. Recommend for patient to followup orthopedic Dr. for further evaluation. I will also prescribe naproxen for his pain. I recommend smoking cessation and to follow up with his regular doctor for further management. Patient was understanding and agrees with plan.     Fayrene Helper, PA-C 05/31/11 1925

## 2011-06-01 NOTE — ED Provider Notes (Signed)
Medical screening examination/treatment/procedure(s) were performed by non-physician practitioner and as supervising physician I was immediately available for consultation/collaboration.  Anh Bigos T Synethia Endicott, MD 06/01/11 1622 

## 2011-06-06 ENCOUNTER — Encounter: Payer: Self-pay | Admitting: Ophthalmology

## 2011-06-06 ENCOUNTER — Ambulatory Visit (INDEPENDENT_AMBULATORY_CARE_PROVIDER_SITE_OTHER): Payer: Self-pay | Admitting: Ophthalmology

## 2011-06-06 VITALS — BP 128/91 | HR 87 | Temp 96.5°F | Ht 64.0 in | Wt 170.3 lb

## 2011-06-06 DIAGNOSIS — R079 Chest pain, unspecified: Secondary | ICD-10-CM

## 2011-06-06 DIAGNOSIS — M549 Dorsalgia, unspecified: Secondary | ICD-10-CM

## 2011-06-06 DIAGNOSIS — F17200 Nicotine dependence, unspecified, uncomplicated: Secondary | ICD-10-CM

## 2011-06-06 NOTE — Patient Instructions (Signed)
-  Please follow up with Vernon Carpenter so we can have the orange card available by the time of your next appt

## 2011-06-06 NOTE — Progress Notes (Signed)
Subjective:   Patient ID: FILIBERTO WAMBLE male   DOB: [birth year] 51 y.o.   MRN: [id number]  HPI: Mr.Tijuan R Saltzman is a 51 y.o. man with chronic back pain who presents for follow up for chronic back pain  Smoking cessation- he says he has cut back quite a bit and that it is going great. However, he says he is still smoking a 1/2 ppd. He admits he was smoking more than 1/2 ppd. He felt he was smoking constantly at the time he was last seen here in march.  Back pain- hurts like somebody is stabbing it, feels like someone is stabbing it. Has trouble sleeping since it hurts when he lays on his back. Still hurts when he sleeps on his side. Sleeping only 1-3 hours a night. Is waking up maybe 3-4 times a night. Has had pain for several years. Also has pain in his lateral left thigh. Pain is worse in last few months. It seems to hurt throughout the day. He feels that being hunched over makes it feel better. He doesn't feel that the naproxen helps much. He feels pain is a 9/10 and is constant.  He was given paper with orange card information last time he was seen. He stopped working in 2009-2010. Wife has disability from falling into a man hole. 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 He is applying for disability, he has a Clinical research associate. Hasn't met with him yet. Otila Kluver is in [place], he has paperwork with him today.   Chest pain- tightness under left breast. Gets very tight. It hurts very bad for short period of time then is better. Bending over hurts. Walking around makes chest pain. Gets short of breath fairly easily.  No past medical history on file.  Current Outpatient Prescriptions  Medication Sig Dispense Refill  . naproxen (NAPROSYN) 500 MG tablet Take 1 tablet (500 mg total) by mouth 2 (two) times daily.  30 tablet  0   No family history on file. History   Social History  . Marital Status: Married    Spouse Name: N/A    Number of Children: N/A  . Years  of Education: N/A   Social History Main Topics  . Smoking status: Current Everyday Smoker -- 0.5 packs/day    Types: Cigarettes  . Smokeless tobacco: None   Comment: trying on his own.  . Alcohol Use: None  . Drug Use: None  . Sexually Active: None   Other Topics Concern  . None   Social History Narrative  . None   Objective:  Physical Exam: Filed Vitals:   06/06/11 1017  BP: 128/91  Pulse: 87  Temp: 96.5 F (35.8 C)  TempSrc: Oral  Height: 5\' 4"  (1.626 m)  Weight: 170 lb 4.8 oz (77.248 kg)  SpO2: 97%   General: middle aged man sitting in chair, atalgic gait HEENT: PERRL, EOMI, no scleral icterus Cardiac: RRR, no rubs, murmurs or gallops Pulm: clear to auscultation bilaterally, moving normal volumes of air Abd: soft, nontender, nondistended, BS present Ext: warm and well perfused, no pedal edema Back: patient indicates bilateral bands of tenderness in the lumbar region that are superior to the sacroiliac joint. He indicates he has some pain on the lateral aspect of his upper left leg. He does not seem to have focal tenderness over the spine. Neuro: alert and oriented X3, cranial nerves II-XII grossly intact, strength full in bilateral LE for hip flexion/extension, foot dorsiflexion/plantar flexion,  and knee extension/flexion. Sensation intact to light touch.  Assessment & Plan:

## 2011-06-07 NOTE — Assessment & Plan Note (Signed)
Patient says he has reduced smoking significantly but is still smoking 1/2 ppd. Unclear if this is because he was smoking more before this and reported only smoking 1/2 ppd or if he hasn't made any further progress.

## 2011-06-07 NOTE — Assessment & Plan Note (Signed)
Patient's chest pain seems improved. Could consider trying nitroglycerin to see if this helps acute episodes of pain which he describes. He was not focusing on this issue today so I did not make any treatment changes.

## 2011-06-07 NOTE — Assessment & Plan Note (Signed)
Patient does not yet have orange card. Says he phoned someone but they didn't return the call. Gave him requirements card and led him to [facility] office and told him to walk in any M-F. Did not refer to physical therapy since he does not have insurance or orange card. When he is seen at next visit, refer for MRI and physical therapy.

## 2011-06-28 ENCOUNTER — Ambulatory Visit (INDEPENDENT_AMBULATORY_CARE_PROVIDER_SITE_OTHER): Payer: Self-pay | Admitting: Internal Medicine

## 2011-06-28 ENCOUNTER — Encounter: Payer: Self-pay | Admitting: Internal Medicine

## 2011-06-28 VITALS — BP 149/96 | HR 75 | Temp 97.1°F | Wt 168.8 lb

## 2011-06-28 DIAGNOSIS — M549 Dorsalgia, unspecified: Secondary | ICD-10-CM

## 2011-06-28 DIAGNOSIS — M542 Cervicalgia: Secondary | ICD-10-CM | POA: Insufficient documentation

## 2011-06-28 MED ORDER — CYCLOBENZAPRINE HCL 10 MG PO TABS: 10.0000 mg | ORAL_TABLET | Freq: Three times a day (TID) | ORAL | Status: DC | PRN

## 2011-06-28 MED ORDER — METHOCARBAMOL 500 MG PO TABS: 500.0000 mg | ORAL_TABLET | Freq: Four times a day (QID) | ORAL | Status: AC
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
## 2011-06-28 NOTE — Assessment & Plan Note (Addendum)
Chronic in nature, not improved with NSAIDs.  Xray of lumbar spine in 05/31/11 showed spurring -Will get MRI of lumbar now that patient has the orange card -Refer to physical therapy -Continue NSAIDs for now as patient has a hx of substance abuse; therefore, I am not comfortable starting him on a narcotic until I see the MRI results -He will need to sign a pain contract if narcotics will be prescribed.
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
## 2011-06-28 NOTE — Patient Instructions (Addendum)
Please take Robaxin 500mg  po qid x 7 days Please get your Xray of neck and we will schedule for MRI of your back Follow up in 3-4 weeks or sooner if pain worsen

## 2011-06-30 ENCOUNTER — Ambulatory Visit (HOSPITAL_COMMUNITY)
Admission: RE | Admit: 2011-06-30 | Discharge: 2011-06-30 | Disposition: A | Discharge status: Home / Self Care | Payer: Self-pay | Source: Ambulatory Visit | Attending: Internal Medicine | Admitting: Internal Medicine

## 2011-06-30 ENCOUNTER — Other Ambulatory Visit: Payer: Self-pay | Admitting: Internal Medicine

## 2011-06-30 DIAGNOSIS — M48061 Spinal stenosis, lumbar region without neurogenic claudication: Secondary | ICD-10-CM | POA: Insufficient documentation

## 2011-06-30 DIAGNOSIS — M5380 Other specified dorsopathies, site unspecified: Secondary | ICD-10-CM | POA: Insufficient documentation

## 2011-06-30 DIAGNOSIS — M549 Dorsalgia, unspecified: Secondary | ICD-10-CM

## 2011-06-30 DIAGNOSIS — M545 Low back pain, unspecified: Secondary | ICD-10-CM | POA: Insufficient documentation

## 2011-06-30 DIAGNOSIS — M79609 Pain in unspecified limb: Secondary | ICD-10-CM | POA: Insufficient documentation

## 2011-07-01 ENCOUNTER — Telehealth: Payer: Self-pay | Admitting: *Deleted

## 2011-07-01 DIAGNOSIS — M542 Cervicalgia: Secondary | ICD-10-CM

## 2011-07-01 DIAGNOSIS — M5416 Radiculopathy, lumbar region: Secondary | ICD-10-CM

## 2011-07-01 MED ORDER — HYDROCODONE-ACETAMINOPHEN 7.5-325 MG PO TABS: 1.0000 | ORAL_TABLET | ORAL | Status: DC | PRN

## 2011-07-01 NOTE — Telephone Encounter (Signed)
Contacted pt with MRI results and informed him of need for neurosurgery referral.  Pt does not have insurance, no resources avail in [place].  He will have to go to [facility], records will be faxed today and will await appt time and date.  Pt given rx for Vicodin 7.5/325 one every 4 hours prn pain #120 with 5 refills per Dr Anselm Jungling.  Rx called into [facility].Pt is also requesting a letter stating that he can not work in this condition.  He needs it by August for court.  Pt informed that his PCP is out of the country and will return in 2-3 weeks and letter will be addressed at that time.  Will add new rx to pt's medication list and forward to pcp for signature.Vernon Carpenter, Vernon Thurow Cassady5/31/201311:45 AM

## 2011-07-12 ENCOUNTER — Encounter: Payer: Self-pay | Admitting: Internal Medicine

## 2011-07-13 ENCOUNTER — Telehealth: Payer: Self-pay | Admitting: *Deleted

## 2011-07-13 NOTE — Telephone Encounter (Signed)
Partner recently checked by GYN doctor - tx for trich and was informed to have partner checked. Appt given. Stanton Kidney Devonn Giampietro RN 07/13/11 3:45PM

## 2011-07-18 ENCOUNTER — Ambulatory Visit: Payer: Self-pay | Attending: Internal Medicine

## 2011-07-18 DIAGNOSIS — M2560 Stiffness of unspecified joint, not elsewhere classified: Secondary | ICD-10-CM | POA: Insufficient documentation

## 2011-07-18 DIAGNOSIS — IMO0001 Reserved for inherently not codable concepts without codable children: Secondary | ICD-10-CM | POA: Insufficient documentation

## 2011-07-18 DIAGNOSIS — M2550 Pain in unspecified joint: Secondary | ICD-10-CM | POA: Insufficient documentation

## 2011-07-19 ENCOUNTER — Encounter: Payer: Self-pay | Admitting: Internal Medicine

## 2011-07-19 ENCOUNTER — Ambulatory Visit (INDEPENDENT_AMBULATORY_CARE_PROVIDER_SITE_OTHER): Payer: Self-pay | Admitting: Internal Medicine

## 2011-07-19 VITALS — BP 128/70 | HR 72 | Temp 97.0°F | Wt 168.2 lb

## 2011-07-19 DIAGNOSIS — Z202 Contact with and (suspected) exposure to infections with a predominantly sexual mode of transmission: Secondary | ICD-10-CM | POA: Insufficient documentation

## 2011-07-19 DIAGNOSIS — M549 Dorsalgia, unspecified: Secondary | ICD-10-CM

## 2011-07-19 DIAGNOSIS — A599 Trichomoniasis, unspecified: Secondary | ICD-10-CM

## 2011-07-19 MED ORDER — METRONIDAZOLE 500 MG PO TABS: 500.0000 mg | ORAL_TABLET | Freq: Two times a day (BID) | ORAL | Status: AC

## 2011-07-19 NOTE — Progress Notes (Signed)
  Subjective:    Patient ID: Vernon Carpenter, male    DOB: [birth year], 51 y.o.   MRN: [id number]  HPI Vernon Carpenter is a 51 year old man with past medical history most significant for chronic back pain. Patient recently had an MRI done and the findings were suggestive of possible L5  nerve impingement. Patient was started on Vicodin 7.5-325 by Dr. Anselm Jungling.  Patient is here today for concerns after her wife was diagnosed with trichomonas. Patient does not have burning or pain with urination. His urinary stream is normal and urine is not changed in color recently. Patient denies any urethral discharge , hematuria, fever, chills. Patient has only one sexual partner and that this his wife.  No other complaints at this time.   Review of Systems  Constitutional: Negative for fever, activity change and appetite change.  HENT: Negative for sore throat.   Respiratory: Negative for cough and shortness of breath.   Cardiovascular: Negative for chest pain and leg swelling.  Gastrointestinal: Negative for nausea, abdominal pain, diarrhea, constipation and abdominal distention.  Genitourinary: Negative for frequency, hematuria and difficulty urinating.  Neurological: Negative for dizziness and headaches.  Psychiatric/Behavioral: Negative for suicidal ideas and behavioral problems.       Objective:   Physical Exam  Constitutional: He is oriented to person, place, and time. He appears well-developed and well-nourished.  HENT:  Head: Normocephalic and atraumatic.  Eyes: Conjunctivae and EOM are normal. Pupils are equal, round, and reactive to light. No scleral icterus.  Neck: Normal range of motion. Neck supple. No JVD present. No thyromegaly present.  Cardiovascular: Normal rate, regular rhythm, normal heart sounds and intact distal pulses.  Exam reveals no gallop and no friction rub.   No murmur heard. Pulmonary/Chest: Effort normal and breath sounds normal. No respiratory distress. He has no wheezes. He  has no rales.  Abdominal: Soft. Bowel sounds are normal. He exhibits no distension and no mass. There is no tenderness. There is no rebound and no guarding.  Musculoskeletal: Normal range of motion. He exhibits no edema and no tenderness.  Lymphadenopathy:    He has no cervical adenopathy.  Neurological: He is alert and oriented to person, place, and time.  Psychiatric: He has a normal mood and affect. His behavior is normal.          Assessment & Plan:

## 2011-07-19 NOTE — Assessment & Plan Note (Addendum)
Patient currently has asymptomatic trichomoniasis as contact . After reviewing the article from up-to-date, I will treat the patient with Flagyl 500 mg twice a day for 7 days. PCR for trichomoniasis on first fraction urine is recommended method for diagnosing trichomoniasis. I will also check for GC and Chlamydia in his urine. Patient was asked to come back to the clinic as needed. My suspicion for other sexually transmitted diseases like HIV, RPR and hepatitis are very low at this time and hence after discussion with Dr. Josem Kaufmann it was decided not to check for these infectious diseases at this time.

## 2011-07-19 NOTE — Assessment & Plan Note (Signed)
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 Anselm Jungling. I will check urine drug screen today.

## 2011-07-19 NOTE — Patient Instructions (Addendum)
Trichomoniasis  Trichomoniasis is an infection, caused by the Trichomonas organism, that affects both women and men. In women, the outer male genitalia and the vagina are affected. In men, the penis is mainly affected, but the prostate and other reproductive organs can also be involved. Trichomoniasis is a sexually transmitted disease (STD) and is most often passed to another person through sexual contact. The majority of people who get trichomoniasis do so from a sexual encounter and are also at risk for other STDs.  CAUSES    Sexual intercourse with an infected partner.   It can be present in swimming pools or hot tubs.  SYMPTOMS    Abnormal gray-green frothy vaginal discharge in women.   Vaginal itching and irritation in women.   Itching and irritation of the area outside the vagina in women.   Penile discharge with or without pain in males.   Inflammation of the urethra (urethritis), causing painful urination.   Bleeding after sexual intercourse.  RELATED COMPLICATIONS   Pelvic inflammatory disease.   Infection of the uterus (endometritis).   Infertility.   Tubal (ectopic) pregnancy.   It can be associated with other STDs, including gonorrhea and chlamydia, hepatitis B, and HIV.  COMPLICATIONS DURING PREGNANCY   Early (premature) delivery.   Premature rupture of the membranes (PROM).   Low birth weight.  DIAGNOSIS    Visualization of Trichomonas under the microscope from the vagina discharge.   Ph of the vagina greater than 4.5, tested with a test tape.   Trich Rapid Test.   Culture of the organism, but this is not usually needed.   It may be found on a Pap test.   Having a "strawberry cervix,"which means the cervix looks very red like a strawberry.  TREATMENT    You may be given medication to fight the infection. Inform your caregiver if you could be or are pregnant. Some medications used to treat the infection should not be taken during pregnancy.   Over-the-counter medications or  creams to decrease itching or irritation may be recommended.   Your sexual partner will need to be treated if infected.  HOME CARE INSTRUCTIONS    Take all medication prescribed by your caregiver.   Take over-the-counter medication for itching or irritation as directed by your caregiver.   Do not have sexual intercourse while you have the infection.   Do not douche or wear tampons.   Discuss your infection with your partner, as your partner may have acquired the infection from you. Or, your partner may have been the person who transmitted the infection to you.   Have your sex partner examined and treated if necessary.   Practice safe, informed, and protected sex.   See your caregiver for other STD testing.  SEEK MEDICAL CARE IF:    You still have symptoms after you finish the medication.   You have an oral temperature above 102 F (38.9 C).   You develop belly (abdominal) pain.   You have pain when you urinate.   You have bleeding after sexual intercourse.   You develop a rash.   The medication makes you sick or makes you throw up (vomit).  Document Released: 07/13/2000 Document Revised: 01/06/2011 Document Reviewed: 08/08/2008  Exit[employer] Patient Information 2012 [employer].

## 2011-07-20 LAB — PRESCRIPTION ABUSE MONITORING 15P, URINE
Benzodiazepine Screen, Urine: NEGATIVE ng/mL
Buprenorphine, Urine: NEGATIVE ng/mL
Cannabinoid Scrn, Ur: NEGATIVE ng/mL
Methadone Screen, Urine: NEGATIVE ng/mL
Opiate Screen, Urine: POSITIVE ng/mL — ABNORMAL HIGH
Oxycodone Screen, Ur: NEGATIVE ng/mL
Tramadol Scrn, Ur: NEGATIVE ng/mL
Zolpidem, Urine: NEGATIVE ng/mL

## 2011-07-21 ENCOUNTER — Ambulatory Visit: Payer: Self-pay | Admitting: Physical Therapy

## 2011-07-21 LAB — OPIATES/OPIOIDS (LC/MS-MS)
Codeine Urine: NEGATIVE NG/ML
Heroin (6-AM), UR: NEGATIVE NG/ML
Hydrocodone: 578 NG/ML — ABNORMAL HIGH

## 2011-07-21 NOTE — Addendum Note (Signed)
Addended by: Neomia Dear on: 07/21/2011 06:02 PM   Modules accepted: Orders

## 2011-07-26 ENCOUNTER — Ambulatory Visit: Payer: Self-pay | Admitting: Physical Therapy

## 2011-07-29 ENCOUNTER — Ambulatory Visit: Payer: Self-pay

## 2011-08-02 ENCOUNTER — Encounter: Payer: Self-pay | Admitting: Internal Medicine

## 2011-08-02 ENCOUNTER — Ambulatory Visit (INDEPENDENT_AMBULATORY_CARE_PROVIDER_SITE_OTHER): Payer: Self-pay | Admitting: Internal Medicine

## 2011-08-02 VITALS — BP 142/84 | HR 77 | Temp 97.0°F | Wt 167.3 lb

## 2011-08-02 DIAGNOSIS — M549 Dorsalgia, unspecified: Secondary | ICD-10-CM

## 2011-08-02 MED ORDER — IBUPROFEN 200 MG PO TABS: 200.0000 mg | ORAL_TABLET | Freq: Two times a day (BID) | ORAL | Status: DC | PRN

## 2011-08-02 NOTE — Assessment & Plan Note (Addendum)
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 Right foraminal narrowing at L4-5 due to spurring. Foraminal encroachment bilaterally L5 S1 with potential impingement of the L5 nerve root bilaterally.  -Neurosurgery at [facility] has reviewed his records and does not think he needs surgical intervention at this time and recommends PT which he currently doing -Continue Vicodin 7.5mg  q4hr prn--working well for him -Will refer to Orthopedist for evaluation and treatment options including possible steroid injections  -Ibuprofen 200mg  bid prn for anti-inflammation (Kidney function [facility]) -Letter was given to patient stating why he cannot work so he can take to court in August for child support--he reports being out of work since 2010.  Case discussed with attending, Dr. Rogelia Boga

## 2011-08-02 NOTE — Patient Instructions (Addendum)
Continue physical therapy Continue taking Vicodin 7.5 every 4 hours when necessary for pain You may also take ibuprofen 200 mg one tablet twice daily as needed for anti-inflammation which will also help with you back pain We will refer you to an orthopedist for further evaluation and treatment Followup with Dr. Anselm Jungling as needed

## 2011-08-02 NOTE — Progress Notes (Signed)
HPI: Vernon Carpenter is a 51 yo M with PMH of clinical dx of COPD, chest pain with neg CT cardiac 2013 presents today for follow up on his back pain. He reports that the Vicodin is helping a lot but it wears off quickly. He is currently doing physical therapy but this does not think it really helps. He wants to know what else can be done for his back pain. He reports slight weakness on his lower extremity as well as left lower leg numbness and tingling. We have referred him to [facility] and wait for his neurosurgery however [facility] will not accept him without insurance. [facility] neurosurgery department has reviewed his MRI records and does not think that he need any surgical intervention at this time and also recommends physical therapy.  Review of system: As per history of present illness  Physical examination: General: alert, well-developed, and cooperative to examination.  Lungs: normal respiratory effort, no accessory muscle use, normal breath sounds, no crackles, and no wheezes. Heart: normal rate, regular rhythm, no murmur, no gallop, and no rub.  Abdomen: soft, non-tender, normal bowel sounds, no distention, no guarding, no rebound tenderness, no hepatomegaly, and no splenomegaly.  Pulses: 2+ DP/PT pulses bilaterally MSK: Tenderness to palpation of the lower lumbar. Positive straight leg test raising of the left. Normal range of motion of the  Hips bilaterally.  Neurologic: alert & oriented X3, cranial nerves II-XII intact, motor strength 4 -5 out of 5 sensa,tion intact to light touch, and abnormal gait secondary to pain.   Psych: Oriented X3, memory intact for recent and remote, normally interactive, good eye contact, + anxious appearing, and + depressed appearing.

## 2011-08-03 ENCOUNTER — Ambulatory Visit: Payer: Self-pay | Attending: Internal Medicine

## 2011-08-03 DIAGNOSIS — M2550 Pain in unspecified joint: Secondary | ICD-10-CM | POA: Insufficient documentation

## 2011-08-03 DIAGNOSIS — M2560 Stiffness of unspecified joint, not elsewhere classified: Secondary | ICD-10-CM | POA: Insufficient documentation

## 2011-08-03 DIAGNOSIS — IMO0001 Reserved for inherently not codable concepts without codable children: Secondary | ICD-10-CM | POA: Insufficient documentation

## 2011-08-10 ENCOUNTER — Ambulatory Visit: Payer: Self-pay

## 2011-08-22 ENCOUNTER — Ambulatory Visit: Payer: Self-pay

## 2011-08-24 ENCOUNTER — Ambulatory Visit: Payer: Self-pay | Admitting: Rehabilitative and Restorative Service Providers"

## 2011-08-30 ENCOUNTER — Ambulatory Visit: Payer: Self-pay | Admitting: Physical Therapy

## 2011-09-01 ENCOUNTER — Encounter: Payer: Self-pay | Admitting: Physical Therapy

## 2011-09-02 ENCOUNTER — Telehealth: Payer: Self-pay | Admitting: *Deleted

## 2011-09-02 NOTE — Telephone Encounter (Signed)
Pt calls and c/o cost of vicodin being 41.04 this month. i spoke w[facility], if pt is prescribed a vicodin that is on the [employer] formulary it will be $4.00, they are as follows:                                           5/325                                            5/500                                           7.5/750                                           10/325

## 2011-09-05 ENCOUNTER — Encounter: Payer: Self-pay | Admitting: Physical Therapy

## 2011-09-05 ENCOUNTER — Other Ambulatory Visit: Payer: Self-pay | Admitting: Internal Medicine

## 2011-09-05 MED ORDER — HYDROCODONE-ACETAMINOPHEN 10-325 MG PO TABS: 1.0000 | ORAL_TABLET | Freq: Four times a day (QID) | ORAL | Status: DC | PRN

## 2011-09-05 NOTE — Progress Notes (Signed)
If patient continues to need pain med, will need to sign pain contract next office visit. Please call in Rx for patient.

## 2011-09-05 NOTE — Telephone Encounter (Signed)
Will change to 10/325mg  q6hr prn pain

## 2011-09-06 ENCOUNTER — Encounter: Payer: Self-pay | Admitting: Licensed Clinical Social Worker

## 2011-09-06 NOTE — Progress Notes (Signed)
Vernon Carpenter was referred today by the triage RN.  Vernon Carpenter presents to triage with request to have disability paperwork completed.  Vernon Carpenter currently has two attorneys, Vernon Carpenter, Esq assists pt with his child support case and [employer] assists pt with disability appeal.  Vernon Carpenter states he has a court date on Friday, 8/9 but is unsure of any add'l information his atty needs.  With pt present, CSW placed call to [employer].  [employer] inquiring if physician has placed Vernon Carpenter on any medical restrictions that prevents pt from working.  ROI signed and CSW faxed [employer]: after visit summary from 7/2 visit and letter from PCP dated 7/2.  Pt informed if atty needs, ROI allow atty to request progress notes. Per pt request, CSW placed call to disability atty.  Informed office [facility] does not completed medical opinion questionnaire.  Pt denies add'l needs at this time.  CSW provided Vernon Carpenter with contact information.

## 2011-09-08 ENCOUNTER — Ambulatory Visit: Payer: Self-pay | Attending: Internal Medicine | Admitting: Physical Therapy

## 2011-09-08 DIAGNOSIS — M2550 Pain in unspecified joint: Secondary | ICD-10-CM | POA: Insufficient documentation

## 2011-09-08 DIAGNOSIS — IMO0001 Reserved for inherently not codable concepts without codable children: Secondary | ICD-10-CM | POA: Insufficient documentation

## 2011-09-08 DIAGNOSIS — M2560 Stiffness of unspecified joint, not elsewhere classified: Secondary | ICD-10-CM | POA: Insufficient documentation

## 2011-10-18 ENCOUNTER — Ambulatory Visit (INDEPENDENT_AMBULATORY_CARE_PROVIDER_SITE_OTHER): Payer: Self-pay | Admitting: Internal Medicine

## 2011-10-18 VITALS — BP 118/75 | HR 69 | Temp 97.6°F | Wt 172.3 lb

## 2011-10-18 DIAGNOSIS — K429 Umbilical hernia without obstruction or gangrene: Secondary | ICD-10-CM

## 2011-10-18 DIAGNOSIS — J449 Chronic obstructive pulmonary disease, unspecified: Secondary | ICD-10-CM | POA: Insufficient documentation

## 2011-10-18 DIAGNOSIS — R05 Cough: Secondary | ICD-10-CM

## 2011-10-18 MED ORDER — ALBUTEROL SULFATE HFA 108 (90 BASE) MCG/ACT IN AERS: 2.0000 | INHALATION_SPRAY | RESPIRATORY_TRACT | Status: DC | PRN

## 2011-10-18 MED ORDER — FLUTICASONE-SALMETEROL 250-50 MCG/DOSE IN AEPB: 1.0000 | INHALATION_SPRAY | Freq: Two times a day (BID) | RESPIRATORY_TRACT | Status: DC

## 2011-10-18 NOTE — Assessment & Plan Note (Signed)
Hernia is reducible, no signs of obstruction/gangrene or incarceration at this time but he does feel pain with coughing.   -Will refer to general surgery: he has appt on 11/02/11  Case discussed with Dr. Rogelia Boga

## 2011-10-18 NOTE — Patient Instructions (Addendum)
Will refer to general surgery for umbilical hernia repair Will get chest Xray Start using VEntolin inhaler 1-2 puffs every 4 hours as needed for shortness of breath or wheezing Start using Advair 1 puff twice daily Please stop smoking Follow up in 2 weeks if not better

## 2011-10-18 NOTE — Assessment & Plan Note (Signed)
Likely from his smoking, I suspect he has a component of copd. No PFTs.   -get CXR  -Gave samples of ventolin and Advair -if there is infiltrates on cxr, will treat with abx -advised on smoking cessation and he states that he has patches at home

## 2011-10-18 NOTE — Progress Notes (Signed)
HPI: Vernon Carpenter is a 51 yo M with hx of back pain presents today because of abdominal pain.    Abdominal pain generalized: intermittent, cough make worst.  Sharp pain, 6/10.  No diarrhea or constipation. Has daily BM.  No fever, chills. Few days ago he felt nauseated but did not vomit.  No hx of heart burn.  ? Hx of umbilical hernia.  No hx of alcohol abuse but does smoke ~ 1ppd.  Cough with occasional white sputum.    As for his back pain, it is bearable, and PT is helping.   ROS: as per HPI  PE: General: alert, well-developed, and cooperative to examination.  Lungs: normal respiratory effort, no accessory muscle use, decreased breath sounds, no crackles, and + expiratory wheezes. Heart: normal rate, regular rhythm, no murmur, no gallop, and no rub.  Abdomen: soft, non-tender, normal bowel sounds, slight distention, no guarding, no rebound tenderness. 2.5cm umbilical hernia, reducible w/o tenderness.   Msk: no joint swelling, no joint warmth, and no redness over joints.  Pulses: 2+ DP/PT pulses bilaterally Extremities: No cyanosis, clubbing, edema

## 2011-11-02 ENCOUNTER — Ambulatory Visit (HOSPITAL_COMMUNITY)
Admission: RE | Admit: 2011-11-02 | Discharge: 2011-11-02 | Disposition: A | Discharge status: Home / Self Care | Payer: Self-pay | Source: Ambulatory Visit | Attending: Internal Medicine | Admitting: Internal Medicine

## 2011-11-02 ENCOUNTER — Encounter (INDEPENDENT_AMBULATORY_CARE_PROVIDER_SITE_OTHER): Payer: Self-pay | Admitting: General Surgery

## 2011-11-02 ENCOUNTER — Ambulatory Visit (INDEPENDENT_AMBULATORY_CARE_PROVIDER_SITE_OTHER): Payer: PRIVATE HEALTH INSURANCE | Admitting: General Surgery

## 2011-11-02 VITALS — BP 152/88 | HR 76 | Temp 97.0°F | Resp 20 | Ht 64.0 in | Wt 170.2 lb

## 2011-11-02 DIAGNOSIS — R05 Cough: Secondary | ICD-10-CM

## 2011-11-02 DIAGNOSIS — R059 Cough, unspecified: Secondary | ICD-10-CM | POA: Insufficient documentation

## 2011-11-02 DIAGNOSIS — R0602 Shortness of breath: Secondary | ICD-10-CM | POA: Insufficient documentation

## 2011-11-02 DIAGNOSIS — K429 Umbilical hernia without obstruction or gangrene: Secondary | ICD-10-CM

## 2011-11-02 NOTE — Patient Instructions (Signed)
Please get your chest xray done. We need to get your shortness of breath worked-up prior to your hernia surgery. Once we get medical clearance, we can schedule your hernia surgery. Also talk with your primary care doctor about smoking cessation. Try to stop smoking

## 2011-11-02 NOTE — Progress Notes (Signed)
Patient ID: Vernon Carpenter, male   DOB: Jan 17, 1961, 51 y.o.   MRN: [id number]  Chief Complaint  Patient presents with  . Pre-op Exam    eval umb hernia    HPI Vernon Carpenter is a 51 y.o. male.   HPI 51 yo WM referred by Dr Anselm Jungling for evaluation of an umbilical hernia. The patient states he has had a hernia for about a year. It bothers him almost on a daily basis. It is more of an ache. Occasionally he will have some sharp discomfort around his umbilicus. He denies any nausea, vomiting, diarrhea or constipation. He has a bowel movement every day. He denies any melena or hematochezia. He denies any prior abdominal surgery. He states that he does get short of breath while sitting down and and with activity. He did not get his chest x-ray as ordered by his PCP.   Past Medical History  Diagnosis Date  . S/P cervical discectomy 03/2001    C6-C7    History reviewed. No pertinent past surgical history.  History reviewed. No pertinent family history.  Social History History  Substance Use Topics  . Smoking status: Current Every Day Smoker -- 0.5 packs/day    Types: Cigarettes  . Smokeless tobacco: Never Used   Comment: trying on his own.  . Alcohol Use: No    No Known Allergies  Current Outpatient Prescriptions  Medication Sig Dispense Refill  . albuterol (PROVENTIL HFA;VENTOLIN HFA) 108 (90 BASE) MCG/ACT inhaler Inhale 2 puffs into the lungs every 4 (four) hours as needed for wheezing.  1 Inhaler  0  . Fluticasone-Salmeterol (ADVAIR DISKUS) 250-50 MCG/DOSE AEPB Inhale 1 puff into the lungs 2 (two) times daily.  1 each  0  . HYDROcodone-acetaminophen (NORCO/VICODIN) 5-325 MG per tablet Take 1 tablet by mouth every 6 (six) hours as needed.      Marland Kitchen ibuprofen (ADVIL) 200 MG tablet Take 1 tablet (200 mg total) by mouth 2 (two) times daily as needed for pain.  100 tablet  2    Review of Systems Review of Systems  Constitutional: Negative for fever, chills, appetite change and unexpected  weight change.  HENT: Positive for neck pain. Negative for congestion and trouble swallowing.   Eyes: Negative for visual disturbance.  Respiratory: Positive for cough (chronic cough; clear sputum). Negative for chest tightness and shortness of breath.   Cardiovascular: Negative for chest pain and leg swelling.       No PND, no orthopnea; +SOB at rest; +DOE.   Gastrointestinal:       See HPI  Genitourinary: Negative for dysuria and hematuria.  Musculoskeletal: Positive for back pain.  Skin: Negative for rash.  Neurological: Positive for headaches. Negative for seizures and speech difficulty.  Hematological: Does not bruise/bleed easily.  Psychiatric/Behavioral: Negative for behavioral problems and confusion.  All other systems reviewed and are negative.    Blood pressure 152/88, pulse 76, temperature 97 F (36.1 C), temperature source Temporal, resp. rate 20, height 5\' 4"  (1.626 m), weight 170 lb 3.2 oz (77.202 kg).  Physical Exam Physical Exam  Vitals reviewed. Constitutional: He is oriented to person, place, and time. He appears well-developed and well-nourished. No distress.  HENT:  Head: Normocephalic and atraumatic.  Right Ear: External ear normal.  Eyes: Conjunctivae normal are normal. No scleral icterus.  Neck: Neck supple. No tracheal deviation present. No thyromegaly present.  Cardiovascular: Normal rate, regular rhythm and normal heart sounds.   Pulmonary/Chest: Effort normal. No respiratory distress. He has  wheezes (mild wheeze).  Abdominal: Soft. Bowel sounds are normal. He exhibits no distension. There is no tenderness. There is no rebound and no guarding. Hernia confirmed negative in the right inguinal area and confirmed negative in the left inguinal area.       Round abdomen; +umbilical hernia, reducible. Defect about 0.5cm  Musculoskeletal: He exhibits no edema.  Lymphadenopathy:    He has no cervical adenopathy.  Neurological: He is alert and oriented to person,  place, and time. He exhibits normal muscle tone.  Skin: Skin is warm and dry. No rash noted. He is not diaphoretic. No erythema. No pallor.  Psychiatric: He has a normal mood and affect. His behavior is normal. Judgment and thought content normal.    Data Reviewed Dr Christie Nottingham note 9/17  Assessment    Umbilical hernia Tobacco use/abuse Chronic cough SOB    Plan    We discussed the etiology of umbilical hernias. We discussed the signs and symptoms of incarceration and strangulation. The patient was given educational material. I also drew diagrams.  We discussed nonoperative and operative management. With respect to operative management, we discussed open repair  I discussed the typical aftercare with the procedure.  We discussed the risk and benefits of surgery including but not limited to bleeding, infection, injury to surrounding structures, hernia recurrence, mesh complications, hematoma/seroma formation, blood clot formation, urinary retention, post operative ileus, general anesthesia risk, long-term abdominal pain.  We discussed the importance of avoiding heavy lifting and straining for a period of 6 weeks. I explained that he is at higher risk for infection, recurrence, and lung problems due to his smoking.  He would like to proceed with surgery. However, given his SOB at rest and DOE, I explained that he needs medical clearance prior to scheduling surgery. I explained that his SOB at rest and DOE needs to be further investigated. I encouraged him to get his CXR as ordered. He will need f/u with his PCP prior to scheduling surgery. We also discussed the importance of smoking cessation.   He will be scheduled for surgery once we get medical clearance.  Mary Sella. Andrey Campanile, MD, FACS General, Bariatric, & Minimally Invasive Surgery [facility], [place]         [facility] M 11/02/2011, 9:55 AM

## 2011-11-07 ENCOUNTER — Ambulatory Visit (INDEPENDENT_AMBULATORY_CARE_PROVIDER_SITE_OTHER): Payer: Self-pay | Admitting: Internal Medicine

## 2011-11-07 ENCOUNTER — Encounter: Payer: Self-pay | Admitting: Internal Medicine

## 2011-11-07 VITALS — BP 127/90 | HR 73 | Temp 96.7°F | Ht 64.0 in | Wt 170.2 lb

## 2011-11-07 DIAGNOSIS — K429 Umbilical hernia without obstruction or gangrene: Secondary | ICD-10-CM

## 2011-11-07 DIAGNOSIS — R05 Cough: Secondary | ICD-10-CM

## 2011-11-07 DIAGNOSIS — Z23 Encounter for immunization: Secondary | ICD-10-CM

## 2011-11-07 DIAGNOSIS — R079 Chest pain, unspecified: Secondary | ICD-10-CM

## 2011-11-07 DIAGNOSIS — F17200 Nicotine dependence, unspecified, uncomplicated: Secondary | ICD-10-CM

## 2011-11-07 DIAGNOSIS — Z299 Encounter for prophylactic measures, unspecified: Secondary | ICD-10-CM

## 2011-11-07 LAB — BASIC METABOLIC PANEL
Glucose, Bld: 76 mg/dL (ref 70–99)
Potassium: 4.5 mEq/L (ref 3.5–5.3)
Sodium: 139 mEq/L (ref 135–145)

## 2011-11-07 LAB — LIPID PANEL: Total CHOL/HDL Ratio: 4.7 Ratio

## 2011-11-07 NOTE — Assessment & Plan Note (Signed)
Patient presents for medical optimization prior to open umbilical hernia surgery by Dr. Andrey Campanile.  Given history of smoking, chronic cough, and question of COPD, patient will need to undergo PFTs.  Also, patient continues to describe atypical chest pain.  Though he had a heart CT done in 01/2010 as part of the PROMISE trial, the distal circumflex and PDA not well visualized, and he continues to have pain.  He may benefit from further cardiac evaluation with stress echo.  For this reason, I will refer him to cardiology.  Today, I will also continue to monitor renal function with BMET and we will also check a lipid panel.  Patient was informed to return once he has completed testing, and then we can proceed with plans for surgery.  Patient was also encouraged to quit smoking, as this will hasten his recovery process after surgery.   Of note, patient has no history or renal failure, DM or CVAs.

## 2011-11-07 NOTE — Patient Instructions (Signed)
-  Before we can proceed with surgery, you need medical optimization by pulmonary function tests and formal evaluation by cardiology.  -Schedule an appointment in clinic once you have been seen by cardiology & once your lung test has been done.  Please be sure to bring all of your medications with you to every visit.  Should you have any new or worsening symptoms, please be sure to call the clinic at [phone].

## 2011-11-07 NOTE — Progress Notes (Signed)
Pt aware of appt PFT 11/09/11 9AM at [facility]. [facility] Zniyah Midkiff RN 11/07/11 11:30AM

## 2011-11-07 NOTE — Progress Notes (Signed)
Subjective:   Patient ID: Vernon Carpenter male   DOB: 1960/06/13 51 y.o.   MRN: [id number]  HPI: Mr.Burrel R Dankers is a 51 y.o. man with h/o back pain & umbilical hernia presents today for medical optimization prior to umbilical hernia repair.  He reports intermittent chest pain, tightness, left sided, lasts about a second and then pressure releases; Sometimes worse with activity (not entirely clear), but really worse when showers. Describes DOE, but able to run.  Feels SOB with 1 flight of stairs. Wakes up gagging/coughing nightly. Sleeps with 1 pillow. Denies LE edema.  He endorses a chronic, clear sputum producing cough.  Smoker - 0.5 PPD since teens  No h/o DM, kidney failure, heart failure, or CVAs.  Cannot report if he experiences heart palpitations.  Unclear about MI history.  No formal dx of HTN.  BP elevated initially today in clinic, but he reports he ran here, and on recheck, BP was 127/90.  BPs in the past have been SBP>140 on several occassions.     Past Medical History  Diagnosis Date  . S/P cervical discectomy 03/2001    C6-C7   Current Outpatient Prescriptions  Medication Sig Dispense Refill  . albuterol (PROVENTIL HFA;VENTOLIN HFA) 108 (90 BASE) MCG/ACT inhaler Inhale 2 puffs into the lungs every 4 (four) hours as needed for wheezing.  1 Inhaler  0  . Fluticasone-Salmeterol (ADVAIR DISKUS) 250-50 MCG/DOSE AEPB Inhale 1 puff into the lungs 2 (two) times daily.  1 each  0  . HYDROcodone-acetaminophen (NORCO/VICODIN) 5-325 MG per tablet Take 1 tablet by mouth every 6 (six) hours as needed.      Marland Kitchen ibuprofen (ADVIL) 200 MG tablet Take 1 tablet (200 mg total) by mouth 2 (two) times daily as needed for pain.  100 tablet  2   Family History  Problem Relation Age of Onset  . Heart disease Father   . Diabetes Father   . Hypertension Father   . Diabetes Mother   . Hypertension Mother     History   Social History  . Marital Status: Married    Spouse Name: N/A    Number  of Children: N/A  . Years of Education: N/A   Social History Main Topics  . Smoking status: Current Every Day Smoker -- 0.5 packs/day    Types: Cigarettes  . Smokeless tobacco: Never Used   Comment: trying on his own.  . Alcohol Use: No  . Drug Use: No  . Sexually Active: None   Other Topics Concern  . None   Social History Narrative  . None   Review of Systems: Constitutional: Denies fever, chills, diaphoresis, appetite change and fatigue.  HEENT: Denies photophobia, eye pain, redness, hearing loss, ear pain, congestion, sore throat, rhinorrhea, sneezing, mouth sores, trouble swallowing, and tinnitus.   Respiratory: as per HPI Cardiovascular: as per HPI Gastrointestinal: Denies nausea, vomiting, diarrhea, constipation, blood in stool and abdominal distention. +diffuse abdominal pain related to hernia Genitourinary: Denies dysuria, urgency, frequency, hematuria, flank pain and difficulty urinating.  Musculoskeletal: chronic back pain Skin: Denies pallor, rash and wound.  Neurological: Denies dizziness, seizures, syncope, weakness, light-headedness, numbness and headaches.  Psychiatric/Behavioral: Denies suicidal ideation, mood changes, confusion, nervousness, sleep disturbance and agitation  Objective:  Physical Exam: Filed Vitals:   11/07/11 0944 11/07/11 1020  BP: 148/97 127/90  Pulse: 78 73  Temp: 96.7 F (35.9 C)   TempSrc: Oral   Height: 5\' 4"  (1.626 m)   Weight: 170 lb 3.2 oz (77.202  kg)   SpO2: 97%    Constitutional: Vital signs reviewed.  Patient is a well-developed and well-nourished man in no acute distress and cooperative with exam.  Head: Normocephalic and atraumatic Mouth: no erythema or exudates, MMM, poor dentition with several missing teeth Eyes: PERRL, EOMI, conjunctivae normal, No scleral icterus.  Neck: Supple, Trachea midline normal ROM, No mass, thyromegaly Cardiovascular: RRR, S1 normal, S2 normal, no MRG, pulses symmetric and intact bilaterally,  no LE edema Pulmonary/Chest: CTAB, no wheezes, rales, or rhonchi Abdominal: Soft. Mildly tender & distended with reducible umbilical hernia, bowel sounds are normal Hematology: no cervical or supraclavicular adenopathy.  Neurological: A&O x3, Strength is normal and symmetric bilaterally, cranial nerve II-XII are grossly intact, no focal motor deficit, sensory intact to light touch bilaterally.  Skin: Warm, dry and intact. No rash, cyanosis, or clubbing.  Psychiatric: Normal mood and affect. speech and behavior is normal. Judgment and thought content normal. Cognition and memory are normal.   Assessment & Plan:  Case and care discussed with Dr. Kem Kays.  Patient to return for medical optimization after seen by cardiology and PFTs are complete.  Please see problem oriented charting for further details.

## 2011-11-09 ENCOUNTER — Other Ambulatory Visit (HOSPITAL_COMMUNITY): Payer: Self-pay | Admitting: Radiology

## 2011-11-09 ENCOUNTER — Ambulatory Visit (HOSPITAL_COMMUNITY)
Admission: RE | Admit: 2011-11-09 | Discharge: 2011-11-09 | Disposition: A | Discharge status: Home / Self Care | Payer: Self-pay | Source: Ambulatory Visit | Attending: Internal Medicine | Admitting: Internal Medicine

## 2011-11-09 DIAGNOSIS — R05 Cough: Secondary | ICD-10-CM

## 2011-11-09 DIAGNOSIS — F17200 Nicotine dependence, unspecified, uncomplicated: Secondary | ICD-10-CM

## 2011-11-09 DIAGNOSIS — R059 Cough, unspecified: Secondary | ICD-10-CM

## 2011-11-09 MED ORDER — ALBUTEROL SULFATE (5 MG/ML) 0.5% IN NEBU
2.5000 mg | INHALATION_SOLUTION | Freq: Once | RESPIRATORY_TRACT | Status: AC
  Administered 2011-11-09: 2.5 mg via RESPIRATORY_TRACT

## 2011-11-09 MED ORDER — ALBUTEROL SULFATE (5 MG/ML) 0.5% IN NEBU: 2.5000 mg | INHALATION_SOLUTION | Freq: Once | RESPIRATORY_TRACT | Status: DC

## 2011-12-05 ENCOUNTER — Encounter: Payer: Self-pay | Admitting: *Deleted

## 2011-12-05 ENCOUNTER — Encounter: Payer: Self-pay | Admitting: Cardiovascular Disease

## 2011-12-06 ENCOUNTER — Ambulatory Visit (INDEPENDENT_AMBULATORY_CARE_PROVIDER_SITE_OTHER): Payer: Self-pay | Admitting: Cardiovascular Disease

## 2011-12-06 ENCOUNTER — Encounter: Payer: Self-pay | Admitting: Cardiovascular Disease

## 2011-12-06 ENCOUNTER — Telehealth: Payer: Self-pay | Admitting: Cardiovascular Disease

## 2011-12-06 VITALS — BP 149/92 | HR 78 | Wt 170.0 lb

## 2011-12-06 DIAGNOSIS — Z01810 Encounter for preprocedural cardiovascular examination: Secondary | ICD-10-CM | POA: Insufficient documentation

## 2011-12-06 DIAGNOSIS — F17200 Nicotine dependence, unspecified, uncomplicated: Secondary | ICD-10-CM

## 2011-12-06 DIAGNOSIS — K429 Umbilical hernia without obstruction or gangrene: Secondary | ICD-10-CM

## 2011-12-06 DIAGNOSIS — R079 Chest pain, unspecified: Secondary | ICD-10-CM

## 2011-12-06 MED ORDER — ALBUTEROL SULFATE HFA 108 (90 BASE) MCG/ACT IN AERS: 2.0000 | INHALATION_SPRAY | RESPIRATORY_TRACT | Status: DC | PRN

## 2011-12-06 MED ORDER — FLUTICASONE-SALMETEROL 250-50 MCG/DOSE IN AEPB: 1.0000 | INHALATION_SPRAY | Freq: Two times a day (BID) | RESPIRATORY_TRACT | Status: DC

## 2011-12-06 NOTE — Telephone Encounter (Signed)
Pt was in today and was to have rx's for albuterol, fluticasone, [facility], pt would like call when done [phone]

## 2011-12-06 NOTE — Assessment & Plan Note (Signed)
Counseled for less than 10 minutes No motivation to quit.  CXR ok  PFT;s ordered Will call in inhalers to [facility] for him

## 2011-12-06 NOTE — Assessment & Plan Note (Signed)
Clear to have general surgery and anesthesia.  Needs F/U with IM to review PFT;s and refer to surgeon.

## 2011-12-06 NOTE — Progress Notes (Signed)
Patient ID: Vernon Carpenter, male   DOB: 10-11-1960, 51 y.o.   MRN: [id number] 51 yo referred by [facility] for SSCP. Seen 1/5 /12 and R/O with negative enzymes, NAD on CXR and ECG with no acute changes. Reviewed visit.  Randomize in Promise trial to CT Calcium score 0 and no significant CAD.  Distal circ and RCA not well visualized  Smokes 1ppd for years. Discussed smoking cessation for less than 10 minutes and relationship between it and vascular disease. CXR 11/03/11 reviewed and NAD  Needs umbilical hernia repair He is confused about whats going on.  Appears that [facility] arranged PFT;s and cardiac clearence.  He is clear to have surgery from cardiac stand point. Needs PFT;s done and F/U with [facility] with surgical referral.     Poor medical F/U with no primary. Currently not working and sedentary.  Labs 10/13 reviewed LDL 121    ROS: Denies fever, malais, weight loss, blurry vision, decreased visual acuity, cough, sputum, SOB, hemoptysis, pleuritic pain, palpitaitons, heartburn, abdominal pain, melena, lower extremity edema, claudication, or rash.  All other systems reviewed and negative  General: Affect appropriate Chronically ill male HEENT: normal Neck supple with no adenopathy JVP normal no bruits no thyromegaly Lungs clear with no wheezing and good diaphragmatic motion Heart:  S1/S2 no murmur, no rub, gallop or click PMI normal Abdomen: benighn, BS positve, no tenderness, no AAA no bruit.  No HSM or HJR Distal pulses intact with no bruits No edema Neuro non-focal Skin warm and dry No muscular weakness   Current Outpatient Prescriptions  Medication Sig Dispense Refill  . albuterol (PROVENTIL HFA;VENTOLIN HFA) 108 (90 BASE) MCG/ACT inhaler Inhale 2 puffs into the lungs every 4 (four) hours as needed for wheezing.  1 Inhaler  0  . Fluticasone-Salmeterol (ADVAIR DISKUS) 250-50 MCG/DOSE AEPB Inhale 1 puff into the lungs 2 (two) times daily.  1 each  0  .  HYDROcodone-acetaminophen (NORCO/VICODIN) 5-325 MG per tablet Take 1 tablet by mouth every 6 (six) hours as needed.      Marland Kitchen ibuprofen (ADVIL) 200 MG tablet Take 1 tablet (200 mg total) by mouth 2 (two) times daily as needed for pain.  100 tablet  2   No current facility-administered medications for this visit.   Facility-Administered Medications Ordered in Other Visits  Medication Dose Route Frequency Provider Last Rate Last Dose  . albuterol (PROVENTIL) (5 MG/ML) 0.5% nebulizer solution 2.5 mg  2.5 mg Nebulization Once Jonah Blue, DO        Allergies  Review of patient's allergies indicates no known allergies.  Electrocardiogram:  05/31/11  SR 69 normal ECG  Assessment and Plan

## 2011-12-06 NOTE — Assessment & Plan Note (Signed)
Not that large reducable F/U general surgery for surgical opinion

## 2011-12-06 NOTE — Patient Instructions (Signed)
Your physician wants you to follow-up in: YEAR WITH DR NISHAN  You will receive a reminder letter in the mail two months in advance. If you don't receive a letter, please call our office to schedule the follow-up appointment.  Your physician recommends that you continue on your current medications as directed. Please refer to the Current Medication list given to you today. 

## 2011-12-06 NOTE — Assessment & Plan Note (Signed)
Resolved Calcium score 0 CT done 2012

## 2011-12-06 NOTE — Telephone Encounter (Signed)
REFILLS  FOR PROVENTIL AND ADVAIR SENT ONCE AGAIN TO [facility] .Zack Seal

## 2011-12-09 ENCOUNTER — Other Ambulatory Visit: Payer: Self-pay | Admitting: Cardiovascular Disease

## 2011-12-12 MED ORDER — ALBUTEROL SULFATE HFA 108 (90 BASE) MCG/ACT IN AERS: 2.0000 | INHALATION_SPRAY | RESPIRATORY_TRACT | Status: DC | PRN

## 2011-12-12 MED ORDER — FLUTICASONE-SALMETEROL 250-50 MCG/DOSE IN AEPB: 1.0000 | INHALATION_SPRAY | Freq: Two times a day (BID) | RESPIRATORY_TRACT | Status: DC

## 2011-12-13 ENCOUNTER — Other Ambulatory Visit: Payer: Self-pay | Admitting: *Deleted

## 2011-12-13 ENCOUNTER — Telehealth: Payer: Self-pay | Admitting: Cardiovascular Disease

## 2011-12-13 ENCOUNTER — Ambulatory Visit (INDEPENDENT_AMBULATORY_CARE_PROVIDER_SITE_OTHER): Payer: Self-pay | Admitting: Internal Medicine

## 2011-12-13 ENCOUNTER — Encounter: Payer: Self-pay | Admitting: Internal Medicine

## 2011-12-13 VITALS — BP 141/97 | HR 84 | Temp 98.2°F | Wt 171.9 lb

## 2011-12-13 DIAGNOSIS — J449 Chronic obstructive pulmonary disease, unspecified: Secondary | ICD-10-CM

## 2011-12-13 DIAGNOSIS — K429 Umbilical hernia without obstruction or gangrene: Secondary | ICD-10-CM

## 2011-12-13 MED ORDER — FLUTICASONE-SALMETEROL 250-50 MCG/DOSE IN AEPB: 1.0000 | INHALATION_SPRAY | Freq: Two times a day (BID) | RESPIRATORY_TRACT | Status: DC

## 2011-12-13 MED ORDER — ALBUTEROL SULFATE HFA 108 (90 BASE) MCG/ACT IN AERS: 2.0000 | INHALATION_SPRAY | RESPIRATORY_TRACT | Status: DC | PRN

## 2011-12-13 NOTE — Telephone Encounter (Signed)
Pt calling re albuterol rx that was called in 12-12-11, [facility] dont [facility] it, can it be called in again? pls call pt when done @ [phone]

## 2011-12-13 NOTE — Patient Instructions (Signed)
Please schedule a follow up appointment  As needed . Please bring your medication bottles with your next appointment. Please take your medicines as prescribed. Please call and schedule an appointment with the surgeon.

## 2011-12-13 NOTE — Progress Notes (Signed)
Subjective:   Patient ID: Vernon Carpenter male   DOB: 1960-04-15 51 y.o.   MRN: [id number]  HPI: 51 year old gentleman with past medical history significant for umblical hernia is to the clinic to followup on his preop clearance .  He is here for medication refill and get pre- op clearence for his umblical hernia repair surgery.   He reports pain in the abdomen and back. He describes his pain in the abdomen around the region of his umbilical hernia, worsens with cough( bulges out). He also reports worsening of chronic low back  for last 3- 4 months. He denies any nausea, vomiting and is moving his bowels. Last BM was this morning.     Past Medical History  Diagnosis Date  . Acute and chronic respiratory failure   . BACK PAIN   . CHEST PAIN   . Plantar fasciitis of left foot   . Neck pain   . Cough   . Umbilical hernia    Family History  Problem Relation Age of Onset  . Heart disease Father   . Diabetes Father   . Hypertension Father   . Diabetes Mother   . Hypertension Mother    History   Social History  . Marital Status: Married    Spouse Name: N/A    Number of Children: N/A  . Years of Education: N/A   Occupational History  . Not on file.   Social History Main Topics  . Smoking status: Current Every Day Smoker -- 0.5 packs/day    Types: Cigarettes  . Smokeless tobacco: Never Used     Comment: trying on his own.  . Alcohol Use: No  . Drug Use: No  . Sexually Active: Not on file   Other Topics Concern  . Not on file   Social History Narrative  . No narrative on file   Review of Systems: General: Denies fever, chills, diaphoresis, appetite change and fatigue. HEENT: Denies photophobia, eye pain, redness, hearing loss, ear pain, congestion, sore throat, rhinorrhea, sneezing, mouth sores, trouble swallowing, neck pain, neck stiffness and tinnitus. Respiratory: Denies SOB, DOE, cough, chest tightness, and wheezing. Cardiovascular: Denies to chest pain,  palpitations and leg swelling. Gastrointestinal: + abdominal pain,Denies nausea, vomiting, diarrhea, constipation, blood in stool and abdominal distention. Genitourinary: Denies dysuria, urgency, frequency, hematuria, flank pain and difficulty urinating. Musculoskeletal: Denies myalgias, back pain, joint swelling, arthralgias and gait problem.  Skin: Denies pallor, rash and wound. Neurological: Denies dizziness, seizures, syncope, weakness, light-headedness, numbness and headaches. Hematological: Denies adenopathy, easy bruising, personal or family bleeding history. Psychiatric/Behavioral: Denies suicidal ideation, mood changes, confusion, nervousness, sleep disturbance and agitation.    Current Outpatient Medications: Current Outpatient Prescriptions  Medication Sig Dispense Refill  . albuterol (PROVENTIL HFA;VENTOLIN HFA) 108 (90 BASE) MCG/ACT inhaler Inhale 2 puffs into the lungs every 4 (four) hours as needed for wheezing.  1 Inhaler  3  . Fluticasone-Salmeterol (ADVAIR DISKUS) 250-50 MCG/DOSE AEPB Inhale 1 puff into the lungs 2 (two) times daily.  1 each  3  . HYDROcodone-acetaminophen (NORCO/VICODIN) 5-325 MG per tablet Take 1 tablet by mouth every 6 (six) hours as needed.      Marland Kitchen ibuprofen (ADVIL) 200 MG tablet Take 1 tablet (200 mg total) by mouth 2 (two) times daily as needed for pain.  100 tablet  2   No current facility-administered medications for this visit.   Facility-Administered Medications Ordered in Other Visits  Medication Dose Route Frequency Provider Last Rate Last Dose  . albuterol (  PROVENTIL) (5 MG/ML) 0.5% nebulizer solution 2.5 mg  2.5 mg Nebulization Once Jonah Blue, DO        Allergies: No Known Allergies    Objective:   Physical Exam: Filed Vitals:   12/13/11 1012  BP: 141/97  Pulse: 84  Temp: 98.2 F (36.8 C)    General: Vital signs reviewed and noted. Well-developed, well-nourished, in no acute distress; alert, appropriate and cooperative  throughout examination. Head: Normocephalic, atraumatic Lungs: Normal respiratory effort. Clear to auscultation BL without crackles or wheezes. Heart: RRR. S1 and S2 normal without gallop, murmur, or rubs. Abdomen:Soft, distended , umbilical hernia + non-tender.  No masses or organomegaly.BS normoactive.  Extremities: No pretibial edema.     Assessment & Plan:

## 2011-12-13 NOTE — Telephone Encounter (Signed)
Called into pharmacy and gave them a prescription for albuterol seems as if it did not reach pharmacy when it was sent through epic

## 2011-12-13 NOTE — Assessment & Plan Note (Signed)
Patient has been cleared from cardiology standpoint to proceed with  surgery. His PFTs were reviewed and are consistent with moderate air flow limitation.He is cleared from medical standpoint to proceed with umbilical hernia repair surgery. - He needs to followup with his surgeon to get a scheduled date and appointment for surgery.

## 2011-12-14 ENCOUNTER — Telehealth (INDEPENDENT_AMBULATORY_CARE_PROVIDER_SITE_OTHER): Payer: Self-pay | Admitting: General Surgery

## 2011-12-14 NOTE — Telephone Encounter (Signed)
Spoke with patient and made him aware I have clearance on him and am waiting on Dr Andrey Campanile to be in the office next week to write orders for him to schedule surgery. He does not want another appt to speak with him prior to surgery, he just wants surgery set up. Made him aware we would contact him next week.

## 2011-12-14 NOTE — Telephone Encounter (Signed)
Message copied by Liliana Cline on Wed Dec 14, 2011 11:47 AM ------      Message from: Isaias Sakai K      Created: Wed Dec 14, 2011 10:38 AM      Regarding: Dr Andrey Campanile      Contact: [phone]       Patient would like an appt as soon as possible to see Dr Andrey Campanile regarding Hernia sx.

## 2011-12-19 ENCOUNTER — Other Ambulatory Visit (INDEPENDENT_AMBULATORY_CARE_PROVIDER_SITE_OTHER): Payer: Self-pay | Admitting: General Surgery

## 2011-12-22 ENCOUNTER — Ambulatory Visit (INDEPENDENT_AMBULATORY_CARE_PROVIDER_SITE_OTHER): Payer: PRIVATE HEALTH INSURANCE | Admitting: General Surgery

## 2011-12-22 ENCOUNTER — Encounter (INDEPENDENT_AMBULATORY_CARE_PROVIDER_SITE_OTHER): Payer: Self-pay | Admitting: General Surgery

## 2011-12-22 VITALS — BP 136/80 | HR 75 | Temp 98.0°F | Resp 18 | Ht 64.0 in | Wt 169.4 lb

## 2011-12-22 DIAGNOSIS — K429 Umbilical hernia without obstruction or gangrene: Secondary | ICD-10-CM

## 2011-12-22 NOTE — Patient Instructions (Signed)
Try to stop smoking Pick up inhaler prescriptions at [facility] [address] at [facility] [address] - if not, call Dr Fabio Bering office

## 2011-12-22 NOTE — Progress Notes (Signed)
Patient ID: Vernon Carpenter, male   DOB: 01/23/1961, 50 y.o.   MRN: [id number]  Chief Complaint  Patient presents with  . Follow-up    hernia    HPI Vernon Carpenter is a 50 y.o. male.   HPI 50 yo WM referred by Dr Ho for evaluation of an umbilical hernia. The patient states he has had a hernia for about a year. It bothers him almost on a daily basis. It is more of an ache. Occasionally he will have some sharp discomfort around his umbilicus. He denies any nausea, vomiting, diarrhea or constipation. He has a bowel movement every day. He denies any melena or hematochezia. He denies any prior abdominal surgery. He states that he does get short of breath while sitting down and and with activity. He did not get his chest x-ray as ordered by his PCP.   Since his last appt on 10/2 he has seen Dr Nishan and his PCP and has been cleared for surgery. He states he was prescribed some inhalers but has been unable to take them because he cannot locate the pharmacy where they were prescribed to. Otherwise he denies any changes to his medical history.  Past Medical History  Diagnosis Date  . Acute and chronic respiratory failure   . BACK PAIN   . CHEST PAIN   . Plantar fasciitis of left foot   . Neck pain   . Cough   . Umbilical hernia     Past Surgical History  Procedure Date  . S/p cervical discectomy     C6-C7    Family History  Problem Relation Age of Onset  . Heart disease Father   . Diabetes Father   . Hypertension Father   . Diabetes Mother   . Hypertension Mother     Social History History  Substance Use Topics  . Smoking status: Current Every Day Smoker -- 0.5 packs/day    Types: Cigarettes  . Smokeless tobacco: Never Used     Comment: trying on his own.  . Alcohol Use: No    No Known Allergies  Current Outpatient Prescriptions  Medication Sig Dispense Refill  . HYDROcodone-acetaminophen (NORCO/VICODIN) 5-325 MG per tablet Take 1 tablet by mouth every 6 (six) hours as  needed.      . albuterol (PROVENTIL HFA;VENTOLIN HFA) 108 (90 BASE) MCG/ACT inhaler Inhale 2 puffs into the lungs every 4 (four) hours as needed for wheezing.  1 Inhaler  3  . Fluticasone-Salmeterol (ADVAIR DISKUS) 250-50 MCG/DOSE AEPB Inhale 1 puff into the lungs 2 (two) times daily.  1 each  3  . ibuprofen (ADVIL) 200 MG tablet Take 1 tablet (200 mg total) by mouth 2 (two) times daily as needed for pain.  100 tablet  2   No current facility-administered medications for this visit.   Facility-Administered Medications Ordered in Other Visits  Medication Dose Route Frequency Provider Last Rate Last Dose  . albuterol (PROVENTIL) (5 MG/ML) 0.5% nebulizer solution 2.5 mg  2.5 mg Nebulization Once Alejandro Paya, DO        Review of Systems Review of Systems  Constitutional: Negative for fever, chills, appetite change and unexpected weight change.  HENT: Positive for neck pain. Negative for congestion and trouble swallowing.   Eyes: Negative for visual disturbance.  Respiratory: Positive for cough (chronic cough; clear sputum). Negative for chest tightness and shortness of breath.   Cardiovascular: Negative for chest pain and leg swelling.       No PND, no   orthopnea; +SOB at rest; +DOE.   Gastrointestinal:       See HPI  Genitourinary: Negative for dysuria and hematuria.  Musculoskeletal: Positive for back pain.  Skin: Negative for rash.  Neurological: Positive for headaches. Negative for seizures and speech difficulty.  Hematological: Does not bruise/bleed easily.  Psychiatric/Behavioral: Negative for behavioral problems and confusion.  All other systems reviewed and are negative.    Blood pressure 136/80, pulse 75, temperature 98 F (36.7 C), temperature source Oral, resp. rate 18, height 5' 4" (1.626 m), weight 169 lb 6.4 oz (76.839 kg).  Physical Exam Physical Exam  Vitals reviewed. Constitutional: He is oriented to person, place, and time. He appears well-developed and  well-nourished. No distress.  HENT:  Head: Normocephalic and atraumatic.  Right Ear: External ear normal.  Eyes: Conjunctivae normal are normal. No scleral icterus.  Neck: Neck supple. No tracheal deviation present. No thyromegaly present.  Cardiovascular: Normal rate, regular rhythm and normal heart sounds.   Pulmonary/Chest: Effort normal. No respiratory distress. He has wheezes (mild wheeze).  Abdominal: Soft. Bowel sounds are normal. He exhibits no distension. There is no tenderness. There is no rebound and no guarding. Hernia confirmed negative in the right inguinal area and confirmed negative in the left inguinal area.       Round abdomen; +umbilical hernia, reducible. Defect about 0.5cm  Musculoskeletal: He exhibits no edema.  Lymphadenopathy:    He has no cervical adenopathy.  Neurological: He is alert and oriented to person, place, and time. He exhibits normal muscle tone.  Skin: Skin is warm and dry. No rash noted. He is not diaphoretic. No erythema. No pallor.  Psychiatric: He has a normal mood and affect. His behavior is normal. Judgment and thought content normal.    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 We discussed the signs and symptoms of incarceration and strangulation. The patient was given educational material. I also drew diagrams.  We rediscussed nonoperative and operative management. With respect to operative management, we discussed open repair  I discussed the typical aftercare with the procedure.  We rediscussed the risk and benefits of surgery including but not limited to bleeding, infection, injury to surrounding structures, hernia recurrence, mesh complications, hematoma/seroma formation, blood clot formation, urinary retention, post operative ileus, general anesthesia  risk, long-term abdominal pain.  We discussed the importance of avoiding heavy lifting and straining for a period of 6 weeks. I explained that he is at higher risk for infection, recurrence, and lung problems due to his smoking.  We also rediscussed the importance of smoking cessation.   He will be scheduled for umbilical hernia repair with possible mesh now that we have medical and cardiac clearance. I wrote down the street address of the pharmacy where I believe the inhalers were e-prescribed too.  Chamya Hunton M. Yamaira Spinner, MD, FACS General, Bariatric, & Minimally Invasive Surgery [facility]         Vernon Carpenter M 12/22/2011, 10:09 AM    

## 2011-12-30 ENCOUNTER — Encounter (HOSPITAL_COMMUNITY): Payer: Self-pay | Admitting: Respiratory Therapy

## 2012-01-02 NOTE — Pre-Procedure Instructions (Signed)
[address]  01/02/2012   Your procedure is scheduled on:  Tuesday December 10  Report to [facility] at 12:15 PM.  Call this number if you have problems the morning of surgery: [phone]   Remember:   Do not eat or drink:After Midnight.    Take these medicines the morning of surgery with A SIP OF WATER: Norco pain pill if needed   Do not wear jewelry, make-up or nail polish.  Do not wear lotions, powders, or perfumes. You may wear deodorant.  Do not shave 48 hours prior to surgery. Men may shave face and neck.  Do not bring valuables to the hospital.  Contacts, dentures or bridgework may not be worn into surgery.  Leave suitcase in the car. After surgery it may be brought to your room.  For patients admitted to the hospital, checkout time is 11:00 AM the day of discharge.   Patients discharged the day of surgery will not be allowed to drive home.    Special Instructions: Shower using CHG 2 nights before surgery and the night before surgery.  If you shower the day of surgery use CHG.  Use special wash - you have one bottle of CHG for all showers.  You should use approximately 1/3 of the bottle for each shower.   Please read over the following fact sheets that you were given: Pain Booklet, Coughing and Deep Breathing and Surgical Site Infection Prevention

## 2012-01-03 ENCOUNTER — Encounter (HOSPITAL_COMMUNITY): Payer: Self-pay

## 2012-01-03 ENCOUNTER — Encounter (HOSPITAL_COMMUNITY)
Admission: RE | Admit: 2012-01-03 | Discharge: 2012-01-03 | Disposition: A | Discharge status: Home / Self Care | Payer: No Typology Code available for payment source | Source: Ambulatory Visit | Attending: General Surgery | Admitting: General Surgery

## 2012-01-03 HISTORY — DX: Unspecified osteoarthritis, unspecified site: M19.90

## 2012-01-03 HISTORY — DX: Unspecified asthma, uncomplicated: J45.909

## 2012-01-03 HISTORY — DX: Chronic obstructive pulmonary disease, unspecified: J44.9

## 2012-01-03 LAB — CBC WITH DIFFERENTIAL/PLATELET
Basophils Absolute: 0.1 10*3/uL (ref 0.0–0.1)
Basophils Relative: 1 % (ref 0–1)
MCHC: 35 g/dL (ref 30.0–36.0)
Monocytes Absolute: 0.9 10*3/uL (ref 0.1–1.0)
Neutro Abs: 5.9 10*3/uL (ref 1.7–7.7)
Neutrophils Relative %: 65 % (ref 43–77)
RDW: 12.7 % (ref 11.5–15.5)

## 2012-01-03 LAB — BASIC METABOLIC PANEL
BUN: 13 mg/dL (ref 6–23)
Calcium: 9.7 mg/dL (ref 8.4–10.5)
Creatinine, Ser: 0.92 mg/dL (ref 0.50–1.35)
GFR calc Af Amer: >90 mL/min (ref >90)
GFR calc non Af Amer: >90 mL/min (ref >90)

## 2012-01-03 LAB — SURGICAL PCR SCREEN: MRSA, PCR: NEGATIVE

## 2012-01-03 NOTE — Progress Notes (Signed)
Pt has cardiac (Dr. Eden Emms) and medical clearance notes in EPIC. Pt states went to [facility] for something and was asked to enroll in a study; had testing of some kind done at that time. Requested any pulmonary/cardiac testing records from [facility].

## 2012-01-09 MED ORDER — CEFAZOLIN SODIUM-DEXTROSE 2-3 GM-% IV SOLR
2.0000 g | INTRAVENOUS | Status: AC
  Administered 2012-01-10: 2 g via INTRAVENOUS
  Filled 2012-01-09: qty 50

## 2012-01-10 ENCOUNTER — Ambulatory Visit (HOSPITAL_COMMUNITY)
Admission: RE | Admit: 2012-01-10 | Discharge: 2012-01-10 | Disposition: A | Discharge status: Home / Self Care | Payer: No Typology Code available for payment source | Source: Ambulatory Visit | Attending: General Surgery | Admitting: General Surgery

## 2012-01-10 ENCOUNTER — Encounter (HOSPITAL_COMMUNITY): Payer: Self-pay | Admitting: Critical Care Medicine

## 2012-01-10 ENCOUNTER — Encounter (HOSPITAL_COMMUNITY)
Admission: RE | Disposition: A | Discharge status: Home / Self Care | Payer: Self-pay | Source: Ambulatory Visit | Attending: General Surgery

## 2012-01-10 ENCOUNTER — Ambulatory Visit (HOSPITAL_COMMUNITY): Payer: No Typology Code available for payment source | Admitting: Critical Care Medicine

## 2012-01-10 ENCOUNTER — Encounter (HOSPITAL_COMMUNITY): Payer: Self-pay

## 2012-01-10 DIAGNOSIS — Z8249 Family history of ischemic heart disease and other diseases of the circulatory system: Secondary | ICD-10-CM | POA: Insufficient documentation

## 2012-01-10 DIAGNOSIS — F17200 Nicotine dependence, unspecified, uncomplicated: Secondary | ICD-10-CM | POA: Insufficient documentation

## 2012-01-10 DIAGNOSIS — J4489 Other specified chronic obstructive pulmonary disease: Secondary | ICD-10-CM | POA: Insufficient documentation

## 2012-01-10 DIAGNOSIS — K429 Umbilical hernia without obstruction or gangrene: Secondary | ICD-10-CM | POA: Insufficient documentation

## 2012-01-10 DIAGNOSIS — J449 Chronic obstructive pulmonary disease, unspecified: Secondary | ICD-10-CM | POA: Insufficient documentation

## 2012-01-10 DIAGNOSIS — Z833 Family history of diabetes mellitus: Secondary | ICD-10-CM | POA: Insufficient documentation

## 2012-01-10 HISTORY — PX: INSERTION OF MESH: SHX5868

## 2012-01-10 HISTORY — PX: UMBILICAL HERNIA REPAIR: SHX196

## 2012-01-10 SURGERY — REPAIR, HERNIA, UMBILICAL, ADULT
Anesthesia: General | Site: Abdomen | Wound class: Clean

## 2012-01-10 MED ORDER — LACTATED RINGERS IV SOLN
INTRAVENOUS | Status: DC
  Administered 2012-01-10: 13:00:00 via INTRAVENOUS

## 2012-01-10 MED ORDER — OXYCODONE HCL 5 MG/5ML PO SOLN: 5.0000 mg | Freq: Once | ORAL | Status: DC | PRN

## 2012-01-10 MED ORDER — MEPERIDINE HCL 25 MG/ML IJ SOLN: 6.2500 mg | INTRAMUSCULAR | Status: DC | PRN

## 2012-01-10 MED ORDER — ONDANSETRON HCL 4 MG/2ML IJ SOLN: 4.0000 mg | Freq: Once | INTRAMUSCULAR | Status: DC | PRN

## 2012-01-10 MED ORDER — HYDROMORPHONE HCL PF 1 MG/ML IJ SOLN: 0.2500 mg | INTRAMUSCULAR | Status: DC | PRN

## 2012-01-10 MED ORDER — OXYCODONE HCL 5 MG PO TABS: 5.0000 mg | ORAL_TABLET | Freq: Once | ORAL | Status: DC | PRN

## 2012-01-10 MED ORDER — 0.9 % SODIUM CHLORIDE (POUR BTL) OPTIME
TOPICAL | Status: DC | PRN
  Administered 2012-01-10: 1000 mL

## 2012-01-10 MED ORDER — ONDANSETRON HCL 4 MG/2ML IJ SOLN
INTRAMUSCULAR | Status: DC | PRN
  Administered 2012-01-10: 4 mg via INTRAVENOUS

## 2012-01-10 MED ORDER — NEOSTIGMINE METHYLSULFATE 1 MG/ML IJ SOLN
INTRAMUSCULAR | Status: DC | PRN
  Administered 2012-01-10: 4 mg via INTRAVENOUS

## 2012-01-10 MED ORDER — ROCURONIUM BROMIDE 100 MG/10ML IV SOLN
INTRAVENOUS | Status: DC | PRN
  Administered 2012-01-10: 30 mg via INTRAVENOUS

## 2012-01-10 MED ORDER — CHLORHEXIDINE GLUCONATE 4 % EX LIQD: 1.0000 "application " | Freq: Once | CUTANEOUS | Status: DC

## 2012-01-10 MED ORDER — ARTIFICIAL TEARS OP OINT
TOPICAL_OINTMENT | OPHTHALMIC | Status: DC | PRN
  Administered 2012-01-10: 1 via OPHTHALMIC

## 2012-01-10 MED ORDER — LIDOCAINE HCL (CARDIAC) 20 MG/ML IV SOLN
INTRAVENOUS | Status: DC | PRN
  Administered 2012-01-10: 100 mg via INTRAVENOUS

## 2012-01-10 MED ORDER — FENTANYL CITRATE 0.05 MG/ML IJ SOLN
INTRAMUSCULAR | Status: DC | PRN
  Administered 2012-01-10: 100 ug via INTRAVENOUS
  Administered 2012-01-10: 50 ug via INTRAVENOUS

## 2012-01-10 MED ORDER — GLYCOPYRROLATE 0.2 MG/ML IJ SOLN
INTRAMUSCULAR | Status: DC | PRN
  Administered 2012-01-10: .6 mg via INTRAVENOUS

## 2012-01-10 MED ORDER — MIDAZOLAM HCL 5 MG/5ML IJ SOLN
INTRAMUSCULAR | Status: DC | PRN
  Administered 2012-01-10: 2 mg via INTRAVENOUS

## 2012-01-10 MED ORDER — BUPIVACAINE-EPINEPHRINE PF 0.25-1:200000 % IJ SOLN
INTRAMUSCULAR | Status: AC
  Filled 2012-01-10: qty 30

## 2012-01-10 MED ORDER — OXYCODONE-ACETAMINOPHEN 5-325 MG PO TABS: 1.0000 | ORAL_TABLET | ORAL | Status: DC | PRN

## 2012-01-10 MED ORDER — PROPOFOL 10 MG/ML IV BOLUS
INTRAVENOUS | Status: DC | PRN
  Administered 2012-01-10: 120 mg via INTRAVENOUS

## 2012-01-10 MED ORDER — BUPIVACAINE-EPINEPHRINE 0.25% -1:200000 IJ SOLN
INTRAMUSCULAR | Status: DC | PRN
  Administered 2012-01-10: 15 mL

## 2012-01-10 SURGICAL SUPPLY — 53 items
BENZOIN TINCTURE PRP APPL 2/3 (GAUZE/BANDAGES/DRESSINGS) ×3 IMPLANT
BLADE SURG 10 STRL SS (BLADE) ×3 IMPLANT
BLADE SURG 15 STRL LF DISP TIS (BLADE) ×2 IMPLANT
BLADE SURG 15 STRL SS (BLADE) ×1
BLADE SURG ROTATE 9660 (MISCELLANEOUS) ×3 IMPLANT
CANISTER SUCTION 2500CC (MISCELLANEOUS) IMPLANT
CHLORAPREP W/TINT 26ML (MISCELLANEOUS) ×3 IMPLANT
CLOTH BEACON ORANGE TIMEOUT ST (SAFETY) ×3 IMPLANT
COVER SURGICAL LIGHT HANDLE (MISCELLANEOUS) ×3 IMPLANT
DECANTER SPIKE VIAL GLASS SM (MISCELLANEOUS) ×3 IMPLANT
DERMABOND ADVANCED (GAUZE/BANDAGES/DRESSINGS) ×1
DERMABOND ADVANCED .7 DNX12 (GAUZE/BANDAGES/DRESSINGS) ×2 IMPLANT
DRAPE PED LAPAROTOMY (DRAPES) ×3 IMPLANT
DRAPE UTILITY 15X26 W/TAPE STR (DRAPE) ×6 IMPLANT
DRSG TEGADERM 4X4.75 (GAUZE/BANDAGES/DRESSINGS) ×3 IMPLANT
ELECT CAUTERY BLADE 6.4 (BLADE) ×3 IMPLANT
ELECT REM PT RETURN 9FT ADLT (ELECTROSURGICAL) ×3
ELECTRODE REM PT RTRN 9FT ADLT (ELECTROSURGICAL) ×2 IMPLANT
GAUZE SPONGE 2X2 8PLY STRL LF (GAUZE/BANDAGES/DRESSINGS) ×2 IMPLANT
GLOVE BIOGEL M STRL SZ7.5 (GLOVE) ×3 IMPLANT
GLOVE BIOGEL PI IND STRL 7.0 (GLOVE) ×2 IMPLANT
GLOVE BIOGEL PI IND STRL 7.5 (GLOVE) ×2 IMPLANT
GLOVE BIOGEL PI IND STRL 8 (GLOVE) ×4 IMPLANT
GLOVE BIOGEL PI INDICATOR 7.0 (GLOVE) ×1
GLOVE BIOGEL PI INDICATOR 7.5 (GLOVE) ×1
GLOVE BIOGEL PI INDICATOR 8 (GLOVE) ×2
GLOVE ECLIPSE 7.5 STRL STRAW (GLOVE) ×3 IMPLANT
GLOVE SURG SS PI 7.0 STRL IVOR (GLOVE) ×3 IMPLANT
GOWN PREVENTION PLUS XXLARGE (GOWN DISPOSABLE) ×3 IMPLANT
GOWN STRL NON-REIN LRG LVL3 (GOWN DISPOSABLE) ×6 IMPLANT
KIT BASIN OR (CUSTOM PROCEDURE TRAY) ×3 IMPLANT
KIT ROOM TURNOVER OR (KITS) ×3 IMPLANT
MARKER SKIN DUAL TIP RULER LAB (MISCELLANEOUS) ×3 IMPLANT
MESH VENTRALEX ST 1-7/10 CRC S (Mesh General) ×3 IMPLANT
NEEDLE HYPO 25GX1X1/2 BEV (NEEDLE) ×3 IMPLANT
NS IRRIG 1000ML POUR BTL (IV SOLUTION) ×3 IMPLANT
PACK SURGICAL SETUP 50X90 (CUSTOM PROCEDURE TRAY) ×3 IMPLANT
PAD ARMBOARD 7.5X6 YLW CONV (MISCELLANEOUS) ×3 IMPLANT
PEN SKIN MARKING BROAD (MISCELLANEOUS) ×3 IMPLANT
PENCIL BUTTON HOLSTER BLD 10FT (ELECTRODE) ×3 IMPLANT
SPONGE GAUZE 2X2 STER 10/PKG (GAUZE/BANDAGES/DRESSINGS) ×1
SPONGE LAP 18X18 X RAY DECT (DISPOSABLE) ×3 IMPLANT
STRIP CLOSURE SKIN 1/2X4 (GAUZE/BANDAGES/DRESSINGS) ×3 IMPLANT
SUT MNCRL AB 4-0 PS2 18 (SUTURE) ×3 IMPLANT
SUT NOVA NAB DX-16 0-1 5-0 T12 (SUTURE) ×3 IMPLANT
SUT VIC AB 3-0 SH 18 (SUTURE) ×3 IMPLANT
SYR BULB 3OZ (MISCELLANEOUS) ×3 IMPLANT
SYR CONTROL 10ML LL (SYRINGE) ×3 IMPLANT
TOWEL OR 17X24 6PK STRL BLUE (TOWEL DISPOSABLE) IMPLANT
TOWEL OR 17X26 10 PK STRL BLUE (TOWEL DISPOSABLE) ×3 IMPLANT
TUBE CONNECTING 12X1/4 (SUCTIONS) IMPLANT
WATER STERILE IRR 1000ML POUR (IV SOLUTION) IMPLANT
YANKAUER SUCT BULB TIP NO VENT (SUCTIONS) IMPLANT

## 2012-01-10 NOTE — H&P (View-Only) (Signed)
Patient ID: Vernon Carpenter, male   DOB: 23-Nov-1960, 51 y.o.   MRN: [id number]  Chief Complaint  Patient presents with  . Follow-up    hernia    HPI Vernon Carpenter is a 51 y.o. male.   HPI 51 yo WM referred by Dr Anselm Jungling for evaluation of an umbilical hernia. The patient states he has had a hernia for about a year. It bothers him almost on a daily basis. It is more of an ache. Occasionally he will have some sharp discomfort around his umbilicus. He denies any nausea, vomiting, diarrhea or constipation. He has a bowel movement every day. He denies any melena or hematochezia. He denies any prior abdominal surgery. He states that he does get short of breath while sitting down and and with activity. He did not get his chest x-ray as ordered by his PCP.   Since his last appt on 10/2 he has seen Dr Eden Emms and his PCP and has been cleared for surgery. He states he was prescribed some inhalers but has been unable to take them because he cannot locate the pharmacy where they were prescribed to. Otherwise he denies any changes to his medical history.  Past Medical History  Diagnosis Date  . Acute and chronic respiratory failure   . BACK PAIN   . CHEST PAIN   . Plantar fasciitis of left foot   . Neck pain   . Cough   . Umbilical hernia     Past Surgical History  Procedure Date  . S/p cervical discectomy     C6-C7    Family History  Problem Relation Age of Onset  . Heart disease Father   . Diabetes Father   . Hypertension Father   . Diabetes Mother   . Hypertension Mother     Social History History  Substance Use Topics  . Smoking status: Current Every Day Smoker -- 0.5 packs/day    Types: Cigarettes  . Smokeless tobacco: Never Used     Comment: trying on his own.  . Alcohol Use: No    No Known Allergies  Current Outpatient Prescriptions  Medication Sig Dispense Refill  . HYDROcodone-acetaminophen (NORCO/VICODIN) 5-325 MG per tablet Take 1 tablet by mouth every 6 (six) hours as  needed.      Marland Kitchen albuterol (PROVENTIL HFA;VENTOLIN HFA) 108 (90 BASE) MCG/ACT inhaler Inhale 2 puffs into the lungs every 4 (four) hours as needed for wheezing.  1 Inhaler  3  . Fluticasone-Salmeterol (ADVAIR DISKUS) 250-50 MCG/DOSE AEPB Inhale 1 puff into the lungs 2 (two) times daily.  1 each  3  . ibuprofen (ADVIL) 200 MG tablet Take 1 tablet (200 mg total) by mouth 2 (two) times daily as needed for pain.  100 tablet  2   No current facility-administered medications for this visit.   Facility-Administered Medications Ordered in Other Visits  Medication Dose Route Frequency Provider Last Rate Last Dose  . albuterol (PROVENTIL) (5 MG/ML) 0.5% nebulizer solution 2.5 mg  2.5 mg Nebulization Once Jonah Blue, DO        Review of Systems Review of Systems  Constitutional: Negative for fever, chills, appetite change and unexpected weight change.  HENT: Positive for neck pain. Negative for congestion and trouble swallowing.   Eyes: Negative for visual disturbance.  Respiratory: Positive for cough (chronic cough; clear sputum). Negative for chest tightness and shortness of breath.   Cardiovascular: Negative for chest pain and leg swelling.       No PND, no  orthopnea; +SOB at rest; +DOE.   Gastrointestinal:       See HPI  Genitourinary: Negative for dysuria and hematuria.  Musculoskeletal: Positive for back pain.  Skin: Negative for rash.  Neurological: Positive for headaches. Negative for seizures and speech difficulty.  Hematological: Does not bruise/bleed easily.  Psychiatric/Behavioral: Negative for behavioral problems and confusion.  All other systems reviewed and are negative.    Blood pressure 136/80, pulse 75, temperature 98 F (36.7 C), temperature source Oral, resp. rate 18, height 5\' 4"  (1.626 m), weight 169 lb 6.4 oz (76.839 kg).  Physical Exam Physical Exam  Vitals reviewed. Constitutional: He is oriented to person, place, and time. He appears well-developed and  well-nourished. No distress.  HENT:  Head: Normocephalic and atraumatic.  Right Ear: External ear normal.  Eyes: Conjunctivae normal are normal. No scleral icterus.  Neck: Neck supple. No tracheal deviation present. No thyromegaly present.  Cardiovascular: Normal rate, regular rhythm and normal heart sounds.   Pulmonary/Chest: Effort normal. No respiratory distress. He has wheezes (mild wheeze).  Abdominal: Soft. Bowel sounds are normal. He exhibits no distension. There is no tenderness. There is no rebound and no guarding. Hernia confirmed negative in the right inguinal area and confirmed negative in the left inguinal area.       Round abdomen; +umbilical hernia, reducible. Defect about 0.5cm  Musculoskeletal: He exhibits no edema.  Lymphadenopathy:    He has no cervical adenopathy.  Neurological: He is alert and oriented to person, place, and time. He exhibits normal muscle tone.  Skin: Skin is warm and dry. No rash noted. He is not diaphoretic. No erythema. No pallor.  Psychiatric: He has a normal mood and affect. His behavior is normal. Judgment and thought content normal.    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 We discussed the signs and symptoms of incarceration and strangulation. The patient was given educational material. I also drew diagrams.  We rediscussed nonoperative and operative management. With respect to operative management, we discussed open repair  I discussed the typical aftercare with the procedure.  We rediscussed the risk and benefits of surgery including but not limited to bleeding, infection, injury to surrounding structures, hernia recurrence, mesh complications, hematoma/seroma formation, blood clot formation, urinary retention, post operative ileus, general anesthesia  risk, long-term abdominal pain.  We discussed the importance of avoiding heavy lifting and straining for a period of 6 weeks. I explained that he is at higher risk for infection, recurrence, and lung problems due to his smoking.  We also rediscussed the importance of smoking cessation.   He will be scheduled for umbilical hernia repair with possible mesh now that we have medical and cardiac clearance. I wrote down the street address of the pharmacy where I believe the inhalers were e-prescribed too.  Mary Sella. Andrey Campanile, MD, FACS General, Bariatric, & Minimally Invasive Surgery [facility], [place]         [facility] M 12/22/2011, 10:09 AM

## 2012-01-10 NOTE — Anesthesia Preprocedure Evaluation (Addendum)
Anesthesia Evaluation  Patient identified by MRN, date of birth, ID band Patient awake    Reviewed: Allergy & Precautions, H&P , NPO status , Patient's Chart, lab work & pertinent test results  Airway Mallampati: I TM Distance: >3 FB Neck ROM: Full    Dental  (+) Dental Advisory Given and Poor Dentition   Pulmonary asthma , COPD COPD inhaler, Current Smoker,          Cardiovascular     Neuro/Psych    GI/Hepatic   Endo/Other    Renal/GU      Musculoskeletal   Abdominal   Peds  Hematology   Anesthesia Other Findings   Reproductive/Obstetrics                          Anesthesia Physical Anesthesia Plan  ASA: III  Anesthesia Plan: General   Post-op Pain Management:    Induction: Intravenous  Airway Management Planned: Oral ETT  Additional Equipment:   Intra-op Plan:   Post-operative Plan: Extubation in OR  Informed Consent: I have reviewed the patients History and Physical, chart, labs and discussed the procedure including the risks, benefits and alternatives for the proposed anesthesia with the patient or authorized representative who has indicated his/her understanding and acceptance.   Dental advisory given  Plan Discussed with: Surgeon and CRNA  Anesthesia Plan Comments:        Anesthesia Quick Evaluation

## 2012-01-10 NOTE — Interval H&P Note (Signed)
History and Physical Interval Note:  01/10/2012 12:50 PM  Vernon Carpenter  has presented today for surgery, with the diagnosis of umbilical hernia  The various methods of treatment have been discussed with the patient and family. After consideration of risks, benefits and other options for treatment, the patient has consented to  Procedure(s) (LRB) with comments: HERNIA REPAIR UMBILICAL ADULT (N/A) - open umbilical hernia repair with possible mesh INSERTION OF MESH (N/A) as a surgical intervention .  The patient's history has been reviewed, patient examined, no change in status, stable for surgery.  I have reviewed the patient's chart and labs.  Questions were answered to the patient's satisfaction.    Mary Sella. Andrey Campanile, MD, FACS General, Bariatric, & Minimally Invasive Surgery [facility] [place]  [facility]

## 2012-01-10 NOTE — Anesthesia Postprocedure Evaluation (Signed)
Anesthesia Post Note  Patient: Vernon Carpenter  Procedure(s) Performed: Procedure(s) (LRB): HERNIA REPAIR UMBILICAL ADULT (N/A) INSERTION OF MESH (N/A)  Anesthesia type: general  Patient location: PACU  Post pain: Pain level controlled  Post assessment: Patient's Cardiovascular Status Stable  Last Vitals:  Filed Vitals:   01/10/12 1440  BP: 142/90  Pulse: 64  Temp:   Resp: 11    Post vital signs: Reviewed and stable  Level of consciousness: sedated  Complications: No apparent anesthesia complications

## 2012-01-10 NOTE — Op Note (Signed)
Umbilical Herniorrhaphy with Mesh Procedure Note  Indications: Symptomatic umbilical hernia   Pre-operative Diagnosis: umbilical hernia   Post-operative Diagnosis: umbilical hernia   Surgeon: Mary Sella. Andrey Campanile, MD FACS   Assistants: none   Anesthesia: General endotracheal anesthesia and Local anesthesia 0.25%marcaine with epi   ASA Class: 2  Procedure Details  The patient was seen in the Holding Room. The risks, benefits, complications, treatment options, and expected outcomes were discussed with the patient. The possibilities of reaction to medication, pulmonary aspiration, perforation of viscus, bleeding, recurrent infection, hernia recurrence, the need for additional procedures, failure to diagnose a condition, and creating a complication requiring transfusion or operation were discussed with the patient. The patient concurred with the proposed plan, giving informed consent. The site of surgery properly noted/marked.   The patient was taken to Operating Room # 2 at [facility], identified as Vernon Carpenter  and the procedure verified as Umbilical Herniorrhaphy. A Time Out was held and the above information confirmed. The patient received IV antibiotics and IV Tylenol prior to the procedure.   The patient was placed supine. After establishing general anesthesia, the abdomen was prepped and draped in standard fashion. 0.250% Marcaine with epinephrine was used to anesthetize the skin. A curvilinear infraumbilical incision was created. Dissection was carried down to the hernia sac located above the fascia and was mobilized from surrounding structures. Intact fascia was identified circumferentially around the defect. Skin and soft tissue was mobilized from the surface of the fascia in a circumferential manner. The defect measured about 1.3 cm. Given his obesity, smoking, and cough, I decided he would benefit from mesh underlay. The defect was enlarged slightly so a finger sweep  could be performed underneath the fascia to ensure there were no adhesions to the anterior abdominal wall. A 4.3 cm round Bard Ventralight ST mesh was then placed thru the defect. The tabs were lifted to ensure that the mesh was flush against the abdominal wall. I then ran my finger around the inside of the defect to ensure nothing was trapped between the mesh and the abdominal wall. A 1-novafil suture was used to secure the base of each tab to the fascia and then the excess tab was trimmed at the fascial level. I then placed 1 additional 1-novafil sutures thru the mesh to the fascia. The fascia was then closed primarily over the mesh with 4 interrupted 1-0 novafil sutures. The cavity was irrigated and additional local was infiltrated in the subcutaneous tissue and fascia. The umbilical stalk was then tacked back down to the fascia with two 3-0 vicryl sutures. Hemostasis was confirmed. The soft tissue was irrigated and closed in layers with inverted interrupted 3-0 vicryl sutures for the deep dermis. The skin incision was closed with a 4-0 monocryl subcuticular closure. Steri-Strips followed by a 4x4 gauze and a tegaderm were applied at the end of the operation.   Instrument, sponge, and needle counts were correct prior to closure and at the conclusion of the case.   Findings:  A 1.5 cm fascial defect; 4.3 cm round Bard Ventralight ST mesh placed under peritoneum  Estimated Blood Loss: Minimal   Drains: none   Implants: as above   Complications: None; patient tolerated the procedure well.   Disposition: PACU - hemodynamically stable.   Condition: stable  Mary Sella. Andrey Campanile, MD, FACS General, Bariatric, & Minimally Invasive Surgery [facility], [place]

## 2012-01-10 NOTE — Preoperative (Signed)
Beta Blockers   Reason not to administer Beta Blockers:Not Applicable 

## 2012-01-10 NOTE — Anesthesia Procedure Notes (Signed)
Procedure Name: Intubation Date/Time: 01/10/2012 1:07 PM Performed by: Elon Alas Pre-anesthesia Checklist: Patient identified, Timeout performed, Emergency Drugs available, Suction available and Patient being monitored Patient Re-evaluated:Patient Re-evaluated prior to inductionOxygen Delivery Method: Circle system utilized Preoxygenation: Pre-oxygenation with 100% oxygen Intubation Type: IV induction Ventilation: Mask ventilation without difficulty Laryngoscope Size: Mac and 3 Grade View: Grade I Tube type: Oral Tube size: 7.5 mm Number of attempts: 1 Airway Equipment and Method: Stylet Placement Confirmation: positive ETCO2,  ETT inserted through vocal cords under direct vision and breath sounds checked- equal and bilateral Secured at: 23 cm Tube secured with: Tape Dental Injury: Teeth and Oropharynx as per pre-operative assessment

## 2012-01-10 NOTE — Transfer of Care (Signed)
Immediate Anesthesia Transfer of Care Note  Patient: Vernon Carpenter  Procedure(s) Performed: Procedure(s) (LRB) with comments: HERNIA REPAIR UMBILICAL ADULT (N/A) INSERTION OF MESH (N/A)  Patient Location: PACU  Anesthesia Type:General  Level of Consciousness: awake, alert  and oriented  Airway & Oxygen Therapy: Patient Spontanous Breathing and Patient connected to nasal cannula oxygen  Post-op Assessment: Report given to PACU RN, Post -op Vital signs reviewed and stable and Patient moving all extremities X 4  Post vital signs: Reviewed and stable  Complications: No apparent anesthesia complications

## 2012-01-11 ENCOUNTER — Encounter (HOSPITAL_COMMUNITY): Payer: Self-pay | Admitting: General Surgery

## 2012-01-19 ENCOUNTER — Telehealth (INDEPENDENT_AMBULATORY_CARE_PROVIDER_SITE_OTHER): Payer: Self-pay | Admitting: General Surgery

## 2012-01-19 NOTE — Telephone Encounter (Signed)
Pt called after removing his steri strips today.  One of them caused some bleeding, but easily controlled.  Reassured pt that he had acted appropriately to cleanse and cover with DSD for now.  He will call back as needed.

## 2012-02-09 ENCOUNTER — Encounter (INDEPENDENT_AMBULATORY_CARE_PROVIDER_SITE_OTHER): Payer: Self-pay | Admitting: General Surgery

## 2012-02-09 ENCOUNTER — Ambulatory Visit (INDEPENDENT_AMBULATORY_CARE_PROVIDER_SITE_OTHER): Payer: PRIVATE HEALTH INSURANCE | Admitting: General Surgery

## 2012-02-09 VITALS — BP 122/80 | HR 72 | Temp 98.0°F | Resp 14 | Ht 64.0 in | Wt 169.0 lb

## 2012-02-09 DIAGNOSIS — Z09 Encounter for follow-up examination after completed treatment for conditions other than malignant neoplasm: Secondary | ICD-10-CM

## 2012-02-09 MED ORDER — OXYCODONE-ACETAMINOPHEN 5-325 MG PO TABS: 1.0000 | ORAL_TABLET | ORAL | Status: DC | PRN

## 2012-02-09 NOTE — Patient Instructions (Signed)
Remember to avoid heavy lifting for another 2 weeks  Mid-Back Strain with Rehab  A strain is an injury in which a tendon or muscle is torn. The muscles and tendons of the mid-back are vulnverable to strains. However, these muscles and tendons are very strong and require a great force to be injured. The muscles of the mid-back are responsible for stabilizing the spinal column, as well as spinal twisting (rotation). Strains are classified into three categories. Grade 1 strains cause pain, but the tendon is not lengthened. Grade 2 strains include a lengthened ligament, due to the ligament being stretched or partially ruptured. With grade 2 strains there is still function, although the function may be decreased. Grade 3 strains involve a complete tear of the tendon or muscle, and function is usually impaired. SYMPTOMS   Pain in the middle of the back.  Pain that may affect only one side, and is worse with movement.  Muscle spasms, and often swelling in the back.  Loss of strength of the back muscles.  Crackling sound (crepitation) when the muscles are touched. CAUSES  Mid-back strains occur when a force is placed on the muscles or tendons that is greater than they can handle. Common causes of injury include:  Ongoing overuse of the muscle-tendon units in the middle back, usually from incorrect body posture.  A single violent injury or force applied to the back. RISK INCREASES WITH:  Sports that involve twisting forces on the spine or a lot of bending at the waist (football, rugby, weightlifting, bowling, golf, tennis, speed skating, racquetball, swimming, running, gymnastics, diving).  Poor strength and flexibility.  Failure to warm up properly before activity.  Family history of low back pain or disk disorders.  Previous back injury or surgery (especially fusion). PREVENTION  Learn and use proper sports technique.  Warm up and stretch properly before activity.  Allow for adequate  recovery between workouts.  Maintain physical fitness:  Strength, flexibility, and endurance.  Cardiovascular fitness. PROGNOSIS  If treated properly, mid-back strains usually heal within 6 weeks. RELATED COMPLICATIONS   Frequently recurring symptoms, resulting in a chronic problem. Properly treating the problem the first time decreases frequency of recurrence.  Chronic inflammation, scarring, and partial muscle-tendon tear.  Delayed healing or resolution of symptoms, especially if activity is resumed too soon.  Prolonged disability. TREATMENT Treatment first involves the use of ice and medicine, to reduce pain and inflammation. As the pain begins to subside, you may begin strengthening and stretching exercises to improve body posture and sport technique. These exercises may be performed at home or with a therapist. Severe injuries may require referral to a therapist for further evaluation and treatment, such as ultrasound. Corticosteroid injections may be given to help reduce inflammation. Biofeedback (watching monitors of your body processes) and psychotherapy may also be prescribed. Prolonged bed rest is felt to do more harm than good. Massage may help break the muscle spasms. Sometimes, an injection of cortisone, with or without local anesthetics, may be given to help relieve the pain and spasms. MEDICATION   If pain medicine is needed, nonsteroidal anti-inflammatory medicines (aspirin and ibuprofen), or other minor pain relievers (acetaminophen), are often advised.  Do not take pain medicine for 7 days before surgery.  Prescription pain relievers may be given, if your caregiver thinks they are needed. Use only as directed and only as much as you need.  Ointments applied to the skin may be helpful.  Corticosteroid injections may be given by your caregiver. These  injections should be reserved for the most serious cases, because they may only be given a certain number of times. HEAT  AND COLD:   Cold treatment (icing) should be applied for 10 to 15 minutes every 2 to 3 hours for inflammation and pain, and immediately after activity that aggravates your symptoms. Use ice packs or an ice massage.  Heat treatment may be used before performing stretching and strengthening activities prescribed by your caregiver, physical therapist, or athletic trainer. Use a heat pack or a warm water soak. SEEK IMMEDIATE MEDICAL CARE IF:  Symptoms get worse or do not improve in 2 to 4 weeks, despite treatment.  You develop numbness, weakness, or loss of bowel or bladder function.  New, unexplained symptoms develop. (Drugs used in treatment may produce side effects.) EXERCISES RANGE OF MOTION (ROM) AND STRETCHING EXERCISES - Mid-Back Strain These exercises may help you when beginning to rehabilitate your injury. In order to successfully resolve your symptoms, you must improve your posture. These exercises are designed to help reduce the forward-head and rounded-shoulder posture which contributes to this condition. Your symptoms may resolve with or without further involvement from your physician, physical therapist or athletic trainer. While completing these exercises, remember:   Restoring tissue flexibility helps normal motion to return to the joints. This allows healthier, less painful movement and activity.  An effective stretch should be held for at least 30 seconds.  A stretch should never be painful. You should only feel a gentle lengthening or release in the stretched tissue. STRETECH - Axial Extension  Stand or sit on a firm surface. Assume a good posture: chest up, shoulders drawn back, stomach muscles slightly tense, knees unlocked (if standing) and feet hip width apart.  Slowly retract your chin, so your head slides back and your chin slightly lowers. Continue to look straight ahead.  You should feel a gentle stretch in the back of your head. Be certain not to feel an aggressive  stretch since this can cause headaches later.  Hold for __________ seconds. Repeat __________ times. Complete this exercise __________ times per day. RANGE OF MOTION- Upper Thoracic Extension  Sit on a firm chair with a high back. Assume a good posture: chest up, shoulders drawn back, abdominal muscles slightly tense, and feet hip width apart. Place a small pillow or folded towel in the curve of your lower back, if you are having difficulty maintaining good posture.  Gently brace your neck with your hands, allowing your arms to rest on your chest.  Continue to support your neck and slowly extend your back over the chair. You will feel a stretch across your upper back.  Hold __________ seconds. Slowly return to the starting position. Repeat __________ times. Complete this exercise __________ times per day. RANGE OF MOTION- Mid-Thoracic Extension  Roll a towel so that it is about 4 inches in diameter.  Position the towel lengthwise. Lay on the towel so that your spine, but not your shoulder blades, are supported.  You should feel your mid-back arching toward the floor. To increase the stretch, extend your arms away from your body.  Hold for __________ seconds. Repeat exercise __________ times, __________ times per day. STRENGTHENING EXERCISES - Mid-Back Strain These exercises may help you when beginning to rehabilitate your injury. They may resolve your symptoms with or without further involvement from your physician, physical therapist or athletic trainer. While completing these exercises, remember:   Muscles can gain both the endurance and the strength needed for everyday activities  through controlled exercises.  Complete these exercises as instructed by your physician, physical therapist or athletic trainer. Increase the resistance and repetitions only as guided by your caregiver.  You may experience muscle soreness or fatigue, but the pain or discomfort you are trying to eliminate  should never worsen during these exercises. If this pain does worsen, stop and make certain you are following the directions exactly. If the pain is still present after adjustments, discontinue the exercise until you can discuss the trouble with your caregiver. STRENGTHENING Quadruped, Opposite UE/LE Lift  Assume a hands and knees position on a firm surface. Keep your hands under your shoulders and your knees under your hips. You may place padding under your knees for comfort.  Find your neutral spine and gently tense your abdominal muscles so that you can maintain this position. Your shoulders and hips should form a rectangle that is parallel with the floor and is not twisted.  Keeping your trunk steady, lift your right hand no higher than your shoulder and then your left leg no higher than your hip. Make sure you are not holding your breath. Hold this position __________ seconds.  Continuing to keep your abdominal muscles tense and your back steady, slowly return to your starting position. Repeat with the opposite arm and leg. Repeat __________ times. Complete this exercise __________ times per day.  STRENGTH - Shoulder Extensors  Secure a rubber exercise band or tubing to a fixed object (table, pole) so that it is at the height of your shoulders when you are either standing, or sitting on a firm armless chair.  With a thumbs-up grip, grasp an end of the band in each hand. Straighten your elbows and lift your hands straight in front of you at shoulder height. Step back away from the secured end of band, until it becomes tense.  Squeezing your shoulder blades together, pull your hands down to the sides of your thighs. Do not allow your hands to go behind you.  Hold for __________ seconds. Slowly ease the tension on the band, as you reverse the directions and return to the starting position. Repeat __________ times. Complete this exercise __________ times per day.  STRENGTH - Horizontal  Abductors Choose one of the two positions to complete this exercise. Prone: lying on stomach:  Lie on your stomach on a firm surface so that your right / left arm overhangs the edge. Rest your forehead on your opposite forearm. With your palm facing the floor and your elbow straight, hold a __________ weight in your hand.  Squeeze your right / left shoulder blade to your mid-back spine and then slowly raise your arm to the height of the bed.  Hold for __________ seconds. Slowly reverse the directions and return to the starting position, controlling the weight as you lower your arm. Repeat __________ times. Complete this exercise __________ times per day. Standing:   Secure a rubber exercise band or tubing, so that it is at the height of your shoulders when you are either standing, or sitting on a firm armless chair.  Grasp an end of the band in each hand and have your palms face each other. Straighten your elbows and lift your hands straight in front of you at shoulder height. Step back away from the secured end of band, until it becomes tense.  Squeeze your shoulder blades together. Keeping your elbows locked and your hands at shoulder height, spread your arms apart, forming a "T" shape with your body. Hold __________  seconds. Slowly ease the tension on the band, as you reverse the directions and return to the starting position. Repeat __________ times. Complete this exercise __________ times per day. STRENGTH - Scapular Retractors and External Rotators, Rowing  Secure a rubber exercise band or tubing, so that it is at the height of your shoulders when you are either standing, or sitting on a firm armless chair.  With a palm-down grip, grasp an end of the band in each hand. Straighten your elbows and lift your hands straight in front of you at shoulder height. Step back away from the secured end of band, until it becomes tense.  Step 1: Squeeze your shoulder blades together. Bending your  elbows, draw your hands to your chest as if you are rowing a boat. At the end of this motion, your hands and elbow should be at shoulder height and your elbows should be out to your sides.  Step 2: Rotate your shoulder to raise your hands above your head. Your forearms should be vertical and your upper arms should be horizontal.  Hold for __________ seconds. Slowly ease the tension on the band, as you reverse the directions and return to the starting position. Repeat __________ times. Complete this exercise __________ times per day.  POSTURE AND BODY MECHANICS CONSIDERATIONS  Mid-Back Strain Keeping correct posture when sitting, standing or completing your activities will reduce the stress put on different body tissues, allowing injured tissues a chance to heal and limiting painful experiences. The following are general guidelines for improved posture. Your physician or physical therapist will provide you with any instructions specific to your needs. While reading these guidelines, remember:  The exercises prescribed by your provider will help you have the flexibility and strength to maintain correct postures.  The correct posture provides the best environment for your joints to work. All of your joints have less wear and tear when properly supported by a spine with good posture. This means you will experience a healthier, less painful body.  Correct posture must be practiced with all of your activities, especially prolonged sitting and standing. Correct posture is as important when doing repetitive low-stress activities (typing) as it is when doing a single heavy-load activity (lifting). PROPER SITTING POSTURE In order to minimize stress and discomfort on your spine, you must sit with correct posture. Sitting with good posture should be effortless for a healthy body. Returning to good posture is a gradual process. Many people can work toward this most comfortably by using various supports until they  have the flexibility and strength to maintain this posture on their own. When sitting with proper posture, your ears will fall over your shoulders and your shoulders will fall over your hips. You should use the back of the chair to support your upper back. Your lower back will be in a neutral position, just slightly arched. You may place a small pillow or folded towel at the base of your low back for  support.  When working at a desk, create an environment that supports good, upright posture. Without extra support, muscles fatigue and lead to excessive strain on joints and other tissues. Keep these recommendations in mind: CHAIR:  A chair should be able to slide under your desk when your back makes contact with the back of the chair. This allows you to work closely.  The chair's height should allow your eyes to be level with the upper part of your monitor and your hands to be slightly lower than your elbows.  BODY POSITION  Your feet should make contact with the floor. If this is not possible, use a foot rest.  Keep your ears over your shoulders. This will reduce stress on your neck and lower back. INCORRECT SITTING POSTURES If you are feeling tired and unable to assume a healthy sitting posture, do not slouch or slump. This puts excessive strain on your back tissues, causing more damage and pain. Healthier options include:  Using more support, like a lumbar pillow.  Switching tasks to something that requires you to be upright or walking.  Talking a brief walk.  Lying down to rest in a neutral-spine position. CORRECT STANDING POSTURES Proper standing posture should be assumed with all daily activities, even if they only take a few moments, like when brushing your teeth. As in sitting, your ears should fall over your shoulders and your shoulders should fall over your hips. You should keep a slight tension in your abdominal muscles to brace your spine. Your tailbone should point down to the  ground, not behind your body, resulting in an over-extended swayback posture.  INCORRECT STANDING POSTURES Common incorrect standing postures include a forward head, locked knees, and an excessive swayback. WALKING Walk with an upright posture. Your ears, shoulders and hips should all line-up. CORRECT LIFTING TECHNIQUES DO :   Assume a wide stance. This will provide you more stability and the opportunity to get as close as possible to the object which you are lifting.  Tense your abdominals to brace your spine. Bend at the knees and hips. Keeping your back locked in a neutral-spine position, lift using your leg muscles. Lift with your legs, keeping your back straight.  Test the weight of unknown objects before attempting to lift them.  Try to keep your elbows locked down at your sides in order get the best strength from your shoulders when carrying an object.  Always ask for help when lifting heavy or awkward objects. INCORRECT LIFTING TECHNIQUES DO NOT:   Lock your knees when lifting, even if it is a small object.  Bend and twist. Pivot at your feet or move your feet when needing to change directions.  Assume that you can safely pick up even a paperclip without proper posture. Document Released: 01/17/2005 Document Revised: 04/11/2011 Document Reviewed: 05/01/2008 [facility] Patient Information 2013 [place], [place].

## 2012-02-09 NOTE — Progress Notes (Signed)
Subjective:     Patient ID: Vernon Carpenter, male   DOB: [birth year], 52 y.o.   MRN: [id number]  HPI 52 year old Caucasian male comes in today for his postoperative appointment. He underwent open repair of an umbilical hernia with mesh on 01/10/2012. He denies any nausea, vomiting, diarrhea or constipation. He states that he has some continued soreness around his belly button but it is not as bad as the first week after surgery. He complains of some pain and discomfort around his left shoulder blade with some shooting pain around the area. He denies any recent trauma to the area.  Review of Systems     Objective:   Physical Exam There were no vitals taken for this visit.  Gen: alert, NAD, non-toxic appearing Pupils: equal, no scleral icterus Abd: soft, nontender, nondistended. healing infraumbilical incision with scab along length of incision. No cellulitis. No incisional hernia Ext: no edema, no calf tenderness Skin: no rash, no jaundice MSKL: no obvious shoulder/back deformity, mild ttp to deep palpation of base of L scapula     Assessment:     S/p umbilical hernia repair with mesh 01/09/13 Likely musculoskeletal strain of left upper bac    Plan:     He appears to to be doing well from his umbilical hernia surgery. He has formed a small scab over the incision however there is no sign of infection. I would like to see him back in about 6-8 weeks to recheck his incision just to make sure that it is healed appropriately. I reminded him that he should not do any heavy lifting or strain his activity for least another 2 weeks. With respect to his left upper back discomfort that appears to be consistent with a pulled muscle. He was given Agricultural engineer. I encouraged him to use ice packs and to take ibuprofen or Motrin as needed. He is also encouraged to avoid heavy lifting with his left arm. Followup with me in 6-8 weeks  Mary Sella. Andrey Campanile, MD, FACS General, Bariatric, & Minimally  Invasive Surgery [facility], [place]

## 2012-03-20 ENCOUNTER — Ambulatory Visit (INDEPENDENT_AMBULATORY_CARE_PROVIDER_SITE_OTHER): Payer: No Typology Code available for payment source | Admitting: Internal Medicine

## 2012-03-20 VITALS — BP 126/89 | HR 90 | Temp 97.0°F | Ht 64.0 in | Wt 169.3 lb

## 2012-03-20 DIAGNOSIS — Z23 Encounter for immunization: Secondary | ICD-10-CM

## 2012-03-20 DIAGNOSIS — M549 Dorsalgia, unspecified: Secondary | ICD-10-CM

## 2012-03-20 DIAGNOSIS — Z299 Encounter for prophylactic measures, unspecified: Secondary | ICD-10-CM

## 2012-03-20 MED ORDER — HYDROCODONE-ACETAMINOPHEN 10-325 MG PO TABS: 1.0000 | ORAL_TABLET | Freq: Four times a day (QID) | ORAL | Status: DC | PRN

## 2012-03-20 MED ORDER — ALBUTEROL SULFATE HFA 108 (90 BASE) MCG/ACT IN AERS: 2.0000 | INHALATION_SPRAY | RESPIRATORY_TRACT | Status: DC | PRN

## 2012-03-20 NOTE — Patient Instructions (Addendum)
You can take Vicodin one tablet every 6 hours as needed for back pain We will try to refer you to neurosurgery again Follow up in 3 months

## 2012-03-20 NOTE — Progress Notes (Signed)
Patient ID: HATEM CULL, male   DOB: 1960/04/30, 52 y.o.   MRN: [id number] HPI: Mr. Dettman is a 52 yo man with PMH of chronic back pain presents today for followup.  He still have pain in his lower back with some radiation to his legs. Pain is worse when he bends over. He states that he does have some bowel incontinence when he cough but this is not a frequent problem. He denies any urinary continence, weakness, or any focal neurological deficits. He used to take hydrocodone 4 tablets daily which controlled his pain well he has been out of this medication for while. He stated that he was not able to afford the medication because of the price went from $6 to $40. He would like to be referred to neurosurgery for surgical options. I spent greater than 40 minutes face-to-face with patient  ROS: as per HPI  Physical examination: General: alert, well-developed, and cooperative to examination.  Lungs: normal respiratory effort, no accessory muscle use, normal breath sounds, no crackles, and no wheezes. Heart: normal rate, regular rhythm, no murmur, no gallop, and no rub.  Abdomen: soft, non-tender, normal bowel sounds, no distention, no guarding, no rebound tenderness. 4 cm healed incision from umbilical hernia repair. Neurologic: nonfocal, 5 out of 5 motor strength in lower and upper extremities. Normal sensation. Rectal tone was normal. Positive straight leg test raising left greater than right  Skin: turgor normal and no rashes.  Psych: appropriate

## 2012-03-20 NOTE — Assessment & Plan Note (Signed)
Chronic back pain. He does have some vertebral spurring as well as potential impingement on L 5-S1. He did report occasional stool incontinence with cough however on my rectal exam, the rectal tone was unremarkable. I do not find any other focal neurological deficits except for positive straight leg test raising left greater than right.  I've tried to refer patient to neurosurgery at [facility] in the past but it was declined. I explained to patient that unless he has acute neuro deficits, we will need to control his symptoms with pain medication. He has tried physical therapy before without much relief. He also tried NSAIDs in the past without help. We had a long discussion about narcotics and that he needs to sign a pain contract in order for him to receive narcotics from this clinic. He understands that he will get random UDS screen as well as this is the only clinic that he can get narcotics from. -Patient signed for hydrocodone 10/325 mg by mouth every 6 hour when necessary pain #120 with 5 refills

## 2012-03-22 NOTE — Addendum Note (Signed)
Addended by: Carrolyn Meiers T on: 03/22/2012 01:36 PM   Modules accepted: Orders

## 2012-03-27 ENCOUNTER — Telehealth: Payer: Self-pay | Admitting: *Deleted

## 2012-03-27 NOTE — Telephone Encounter (Signed)
Pt calls and states he needs a letter for court stating he is under your care for child support court, it needs to state that he is unable to work due to back problems and that pt has applied for disability but that will be appr 6 more months before the determination will be made.

## 2012-03-29 ENCOUNTER — Ambulatory Visit (INDEPENDENT_AMBULATORY_CARE_PROVIDER_SITE_OTHER): Payer: PRIVATE HEALTH INSURANCE | Admitting: General Surgery

## 2012-03-29 ENCOUNTER — Encounter (INDEPENDENT_AMBULATORY_CARE_PROVIDER_SITE_OTHER): Payer: Self-pay | Admitting: General Surgery

## 2012-03-29 VITALS — BP 138/80 | HR 78 | Temp 96.0°F | Resp 18 | Ht 64.0 in | Wt 167.8 lb

## 2012-03-29 DIAGNOSIS — Z09 Encounter for follow-up examination after completed treatment for conditions other than malignant neoplasm: Secondary | ICD-10-CM

## 2012-03-29 NOTE — Patient Instructions (Signed)
Can resume full activities F/u with your primary doctor regarding your back pain

## 2012-03-29 NOTE — Progress Notes (Signed)
Subjective:     Patient ID: Vernon Carpenter, male   DOB: [birth year], 52 y.o.   MRN: [id number]  HPI 52 year old Caucasian male comes in for followup after undergoing open umbilical hernia repair on December 10. I last saw him in the office on January 9. He states that he has done well with respect to his abdomen since his last visit. He denies any fevers, chills, nausea, vomiting, diarrhea or constipation. Denies any abdominal pain. He states that he still having some problems with his back pain. He now has discomfort in both shoulder areas. He saw his primary care physician who thinks he may have bone spurs  Review of Systems     Objective:   Physical Exam BP 138/80  Pulse 78  Temp(Src) 96 F (35.6 C) (Temporal)  Resp 18  Ht 5\' 4"  (1.626 m)  Wt 167 lb 12.8 oz (76.114 kg)  BMI 28.79 kg/m2 Alert, no apparent distress Abdomen-soft, nontender, nondistended. Obese. Well-healed infraumbilical transverse incision. No evidence of cellulitis, induration, or hematoma. No evidence of hernia recurrence    Assessment:     Status post open repair of umbilical hernia     Plan:     Overall I think he is doing well with respect to his umbilical hernia repair. I release him to full activities. I reminded him that he should try to avoid heavy lifting if possible. I encouraged him to follow with his primary care physician regarding his ongoing back pain. Followup as needed  Mary Sella. Andrey Campanile, MD, FACS General, Bariatric, & Minimally Invasive Surgery [facility], [place]

## 2012-04-04 ENCOUNTER — Encounter: Payer: Self-pay | Admitting: Internal Medicine

## 2012-06-12 ENCOUNTER — Ambulatory Visit: Payer: Self-pay

## 2012-06-12 ENCOUNTER — Ambulatory Visit (INDEPENDENT_AMBULATORY_CARE_PROVIDER_SITE_OTHER): Payer: No Typology Code available for payment source | Admitting: Internal Medicine

## 2012-06-12 VITALS — BP 137/87 | HR 70 | Temp 98.6°F | Ht 64.0 in | Wt 170.6 lb

## 2012-06-12 DIAGNOSIS — J449 Chronic obstructive pulmonary disease, unspecified: Secondary | ICD-10-CM

## 2012-06-12 DIAGNOSIS — F17200 Nicotine dependence, unspecified, uncomplicated: Secondary | ICD-10-CM

## 2012-06-12 DIAGNOSIS — M549 Dorsalgia, unspecified: Secondary | ICD-10-CM

## 2012-06-12 MED ORDER — ALBUTEROL SULFATE HFA 108 (90 BASE) MCG/ACT IN AERS: 2.0000 | INHALATION_SPRAY | RESPIRATORY_TRACT | Status: DC | PRN

## 2012-06-12 NOTE — Assessment & Plan Note (Signed)
Stable, no weakness or incontinence. -Will continue Vicodin as prescribed -He will meet his new doctor in July-August for refills

## 2012-06-12 NOTE — Assessment & Plan Note (Signed)
Patient is ready to quit and was given 1800-quitnow and will get patches. If that does not work, we may try Chantix next time and MAP will cover for this medication

## 2012-06-12 NOTE — Progress Notes (Signed)
Case discussed with Dr. Anselm Jungling at time of visit.  We reviewed the resident's history and exam and pertinent patient test results.  I agree with the assessment, diagnosis, and plan of care documented in the resident's note.  Review of PFTs are more consistent with Asthma rather than COPD given the preserved DLCO.  Costs may be a barrier to inhaled corticosteroids.  Will try PRN albuterol to see if we can get control of asthma symptoms.  If not, may have to work on trying to get inhaled steroids.

## 2012-06-12 NOTE — Progress Notes (Signed)
Patient ID: Vernon Carpenter, male   DOB: 1960/02/21, 52 y.o.   MRN: [id number] HPI: Vernon Carpenter is a 52 yo man with PMH of back pain presents today for routine follow up.  His pain is adequately controlled but sometimes 7/10 in severity but denies any weakness or incontinence. Breathing is difficult for a long time since he started smoking about 4 yrs.  He can walk down the hall and feel SOB on a daily basis but it does not wake him up at night. Cough, with clear phlegm. No hx of allergy or family hx of asthma.  Smoking 1/2 ppd. Ready to quit. No other complaints. Did PFT before: EV1/FVC 69%, moderate airflow limitation, improved after bronchodilator Colonoscopy scheduled for July  ROS: as per HPI  PE: General: alert, well-developed, and cooperative to examination.  Lungs: normal respiratory effort, no accessory muscle use, normal breath sounds, no crackles, and no wheezes. Heart: normal rate, regular rhythm, no murmur, no gallop, and no rub.  Abdomen: soft, non-tender, normal bowel sounds,  no guarding, no rebound tenderness, no hepatomegaly, and no splenomegaly. Umbilical hernia repaired with healed scar Neurologic: nonfocal Skin: turgor normal and no rashes.  Psych: appropriate

## 2012-06-12 NOTE — Patient Instructions (Addendum)
Start using albuterol inhaler 2 puffs every 4 hours as needed for shortness of breath or wheezing Please call 1-800-QUIT NOW for help with smoking cessation-- they will give you patches Diet and Exercise as we discussed Follow up with your new doctor in July or August

## 2012-06-12 NOTE — Assessment & Plan Note (Signed)
No wheezing on exam today, O2 sat 97% on RA.  PFT 11/09/2011 FEV1/FVC: 69%, normal DLCO, mod airflow limitation, significant improvement in airflows after bronchodilator. Lung Volume hyperinflated. PFT consistent with Asthma.  -Will start with albuterol inhaler PRN -Although he describes SOB daily, but patient only has orange card and may not be able to afford inhaled steroid.   -If he continues to be symptomatic despite PRN albuterol, will try inhaled corticosteroids next -Advised smoking cessation and he states he is ready to quit Discussed with Dr. Josem Kaufmann

## 2012-07-17 ENCOUNTER — Other Ambulatory Visit: Payer: Self-pay | Admitting: Internal Medicine

## 2012-07-17 MED ORDER — HYDROCODONE-ACETAMINOPHEN 10-325 MG PO TABS: 1.0000 | ORAL_TABLET | Freq: Four times a day (QID) | ORAL | Status: DC | PRN

## 2012-08-02 ENCOUNTER — Ambulatory Visit (INDEPENDENT_AMBULATORY_CARE_PROVIDER_SITE_OTHER): Payer: No Typology Code available for payment source | Admitting: Internal Medicine

## 2012-08-02 ENCOUNTER — Encounter: Payer: Self-pay | Admitting: Internal Medicine

## 2012-08-02 VITALS — BP 147/92 | HR 79 | Temp 97.6°F | Ht 64.0 in | Wt 169.8 lb

## 2012-08-02 DIAGNOSIS — T8130XA Disruption of wound, unspecified, initial encounter: Secondary | ICD-10-CM | POA: Insufficient documentation

## 2012-08-02 DIAGNOSIS — J449 Chronic obstructive pulmonary disease, unspecified: Secondary | ICD-10-CM

## 2012-08-02 DIAGNOSIS — Z1211 Encounter for screening for malignant neoplasm of colon: Secondary | ICD-10-CM

## 2012-08-02 DIAGNOSIS — M5090 Cervical disc disorder, unspecified, unspecified cervical region: Secondary | ICD-10-CM

## 2012-08-02 DIAGNOSIS — F17200 Nicotine dependence, unspecified, uncomplicated: Secondary | ICD-10-CM

## 2012-08-02 DIAGNOSIS — M549 Dorsalgia, unspecified: Secondary | ICD-10-CM

## 2012-08-02 MED ORDER — GABAPENTIN 300 MG PO CAPS: 300.0000 mg | ORAL_CAPSULE | Freq: Three times a day (TID) | ORAL | Status: DC

## 2012-08-02 MED ORDER — FLUTICASONE PROPIONATE HFA 44 MCG/ACT IN AERO: 1.0000 | INHALATION_SPRAY | Freq: Two times a day (BID) | RESPIRATORY_TRACT | Status: DC

## 2012-08-02 NOTE — Assessment & Plan Note (Addendum)
  Assessment: Progress toward smoking cessation:  smoking less Barriers to progress toward smoking cessation:  other tobacco users at home  Plan: Instruction/counseling given:  I counseled patient on the dangers of tobacco use, advised patient to stop smoking, and reviewed strategies to maximize success. Educational resources provided:  [employer], [employer]) brochure Self management tools provided:  smoking cessation plan (STAR Quit Plan) Medications to assist with smoking cessation:  None Patient agreed to the following self-care plans for smoking cessation: call [facility] (1-800-QUIT-NOW)  Other plans: Patient will try again to call the Quitline since his cell phone is working again. We will consider Chantix next time if he is still struggling with quitting; MAP will cover.

## 2012-08-02 NOTE — Assessment & Plan Note (Addendum)
The patient complains of radicular symptoms (numbness in hands, pain shooting down arms) and has prior evidence of disc protrusion on MR from 2003. Exam shows normal strength, reflexes, sensation. - Referral to sports medicine for candidacy for cortisone injections, etc. - Adding Gabapentin 300mg  TID for additional pain management

## 2012-08-02 NOTE — Assessment & Plan Note (Addendum)
Back pain stable. No radicular symptoms. - Continue hydrocodone/tylenol as per pain contract - Repeat screening UTox today given abnormal test last visit

## 2012-08-02 NOTE — Progress Notes (Signed)
Subjective:    Patient ID: Vernon Carpenter, male    DOB: 20-Oct-1960, 52 y.o.   MRN: [id number]  HPI Vernon Carpenter is a 52 yo man with PMH of back pain, asthma, and tobacco abuse who presents today for pain in his back, neck and shoulders.   He has a history of chronic lower back pain, MRI 06/2011 showing Mild spinal stenosis at L3-4, Right foraminal narrowing at L4-5, Foraminal encroachment bilaterally L5-S1 with potential impingement of the L5 nerve root bilaterally. He has a history of sciatic-type pain but not recently. He manages his pain with hydrocodone/tylenol 10-325 every 6 hours. Tried PT in the past, but it did not help. Denies weakness, numbness, incontinence. No recent injury. Pain contract signed in 03/2012. NOTE: UTox from 07/18/12 shows only hydrocodone and NO hydromorphone (the active metabolite of hydrocodone). This may suggest manipulation of the test via pill dipping.  He also reports left shoulder pain. It occasionally radiates down his arm causing numbness in his left fingers. Last imaging of his cervical spine was in 03/2001 showing MODERATE TO LARGE SIZE C6-7 DISC PROTRUSION. There appears to be an active order for cervical spine films, but it has not been performed.  Regarding his asthma, the patient notes SOB and wheezing after walking >2 blocks. No night awakenings. Using prn albuterol every morning 2 puffs. PFT 11/09/2011 showed FEV1/FVC: 69%, normal DLCO, mod airflow limitation, significant improvement in airflows after bronchodilator. Lung Volume hyperinflated. Consistent with Asthma.  Smoking 0.5 PPD (former 1.5 PPD smoker). Interested in quitting because it causes him to cough and be short of breath. He tried calling the 1-800 Quitline, but only spoke with them for a few minutes because his cell phone died. He is interested in talking to them again.  He had a hernia repair in December. Complains of a stinging feeling near the incision site at his umbilicus.  He is due for  a colonoscopy. The referral was placed last visit and he will be contacted this month for an appointment.   Review of Systems  Constitutional: Negative for fever and chills.  HENT: Negative for congestion, sore throat and rhinorrhea.   Respiratory: Positive for cough (After smoking cigarettes) and shortness of breath (When walking >2 city blocks).   Cardiovascular: Negative for chest pain.  Gastrointestinal: Negative for nausea and vomiting.  Genitourinary: Negative for dysuria and difficulty urinating.  Musculoskeletal: Positive for back pain.  Skin: Negative for rash.  Neurological: Negative for weakness, numbness and headaches. Dizziness: When he stands from sitting.       Current Outpatient Prescriptions on File Prior to Visit  Medication Sig Dispense Refill  . albuterol (PROVENTIL HFA;VENTOLIN HFA) 108 (90 BASE) MCG/ACT inhaler Inhale 2 puffs into the lungs every 4 (four) hours as needed for wheezing or shortness of breath.  1 Inhaler  2  . HYDROcodone-acetaminophen (NORCO) 10-325 MG per tablet Take 1 tablet by mouth every 6 (six) hours as needed for pain.  120 tablet  5   Current Facility-Administered Medications on File Prior to Visit  Medication Dose Route Frequency Provider Last Rate Last Dose  . albuterol (PROVENTIL) (5 MG/ML) 0.5% nebulizer solution 2.5 mg  2.5 mg Nebulization Once Jonah Blue, DO         Objective:   Physical Exam  Constitutional: He appears well-developed and well-nourished.  HENT:  Head: Normocephalic and atraumatic.  Eyes: EOM are normal. Pupils are equal, round, and reactive to light.  Neck: Normal range of motion. Neck supple.  Cardiovascular: Normal rate, regular rhythm and normal heart sounds.   Pulmonary/Chest: Effort normal and breath sounds normal. He has no wheezes.  Abdominal: Soft. Bowel sounds are normal. He exhibits no distension. There is no tenderness.  The end of an fascial suture is poking underneath the skin of his umbilicus,  near the incision site from his hernia repair. NO skin breakdown.  Musculoskeletal:       Left shoulder: Normal. He exhibits normal range of motion and normal strength.       Cervical back: He exhibits tenderness (Paraspinal muscles). He exhibits no swelling and no deformity.  Spurling maneuver does not reproduce paresthesias or radiation.  Neurological: He has normal strength and normal reflexes. No sensory deficit. He displays no Babinski's sign on the left side.          Assessment & Plan:

## 2012-08-02 NOTE — Assessment & Plan Note (Addendum)
Patient with mild persistent asthma. - Adding ICS to regimen - Encouraged the patient to quit smoking

## 2012-08-02 NOTE — Patient Instructions (Signed)
-   We have added gabapentin to your pain medications. This is a medication specifically for nerve pain. Please take as directed. - We have referred you to sports medicine for your back and neck pain. - We have added an inhaled steroid to your asthma medication. Please take as directed. - You had a urine drug screen today. I will call you with any abnormal results. - If the stitch in your belly button hurts more, we will refer you to your surgeon at the next visit. Please do not touch it as this can cause irritation. - Return to see me in 1 month.

## 2012-08-02 NOTE — Assessment & Plan Note (Signed)
Patient states the internal suture is not bothering him tremendously, except when he pushes on it. - Encouraged pt to not touch the area - Will reevaluate at next visit and decide together whether he should return to his surgeon for incision and trimming

## 2012-08-03 LAB — PRESCRIPTION ABUSE MONITORING 15P, URINE
Buprenorphine, Urine: NEGATIVE ng/mL
Cannabinoid Scrn, Ur: NEGATIVE ng/mL
Cocaine Metabolites: NEGATIVE ng/mL
Creatinine, Urine: 284.44 mg/dL (ref >20.0)
Methadone Screen, Urine: NEGATIVE ng/mL
Oxycodone Screen, Ur: NEGATIVE ng/mL
Tramadol Scrn, Ur: NEGATIVE ng/mL
Zolpidem, Urine: NEGATIVE ng/mL

## 2012-08-07 LAB — OPIATES/OPIOIDS (LC/MS-MS)
Heroin (6-AM), UR: NEGATIVE ng/mL
Hydromorphone: 291 ng/mL
Norhydrocodone, Ur: >2500 ng/mL
Noroxycodone, Ur: NEGATIVE ng/mL
Oxycodone, ur: NEGATIVE ng/mL
Oxymorphone: NEGATIVE ng/mL

## 2012-08-07 LAB — BENZODIAZEPINES (GC/LC/MS), URINE
Alprazolam metabolite (GC/LC/MS), ur confirm: NEGATIVE ng/mL
Clonazepam metabolite (GC/LC/MS), ur confirm: 26 ng/mL
Diazepam (GC/LC/MS), ur confirm: NEGATIVE ng/mL
Flunitrazepam metabolite (GC/LC/MS), ur confirm: NEGATIVE ng/mL
Flurazepam metabolite (GC/LC/MS), ur confirm: NEGATIVE ng/mL
Lorazepam (GC/LC/MS), ur confirm: NEGATIVE ng/mL
Midazolam (GC/LC/MS), ur confirm: NEGATIVE ng/mL
Nordiazepam (GC/LC/MS), ur confirm: NEGATIVE ng/mL
Triazolam metabolite (GC/LC/MS), ur confirm: NEGATIVE ng/mL

## 2012-08-20 ENCOUNTER — Encounter: Payer: Self-pay | Admitting: Sports Medicine

## 2012-08-20 ENCOUNTER — Other Ambulatory Visit: Payer: No Typology Code available for payment source

## 2012-08-20 ENCOUNTER — Ambulatory Visit (INDEPENDENT_AMBULATORY_CARE_PROVIDER_SITE_OTHER): Payer: No Typology Code available for payment source | Admitting: Sports Medicine

## 2012-08-20 VITALS — BP 127/90 | Ht 64.0 in | Wt 170.0 lb

## 2012-08-20 DIAGNOSIS — Z1211 Encounter for screening for malignant neoplasm of colon: Secondary | ICD-10-CM

## 2012-08-20 DIAGNOSIS — M549 Dorsalgia, unspecified: Secondary | ICD-10-CM

## 2012-08-20 LAB — POC HEMOCCULT BLD/STL (HOME/3-CARD/SCREEN)
Card #3 Fecal Occult Blood, POC: NEGATIVE
Fecal Occult Blood, POC: NEGATIVE

## 2012-08-20 MED ORDER — MELOXICAM 15 MG PO TABS: ORAL_TABLET | ORAL | Status: DC

## 2012-08-20 NOTE — Addendum Note (Signed)
Addended by: Kynzee Devinney, Luis Abed on: 08/20/2012 04:24 PM   Modules accepted: Orders

## 2012-08-20 NOTE — Progress Notes (Signed)
  Subjective:    Patient ID: Vernon Carpenter, male    DOB: Dec 31, 1960, 52 y.o.   MRN: [id number]  HPI chief complaint: Low back pain  52 year old male comes in today complaining of long-standing low back pain. He has had pain now for several years but it has been worse over the past 6 months. No trauma that he can recall. No prior low back surgery. He describes an aching discomfort diffuse across his low back without any significant radiating pain into his legs. No associated numbness or tingling. No groin pain. He's been treated with hydrocodone, Neurontin, and physical therapy with little relief of symptoms. No change in bowel bladder habits. He states that his pain is constantly present and not worsened by activity. He had an MRI of his lumbar spine done in May of 2013 which is available for review  Past medical history and current medications are reviewed No known drug allergy Smokes half a pack of cigarettes a day and is currently unemployed    Review of Systems     Objective:   Physical Exam Well-developed, well-nourished. No acute distress. Awake alert and oriented x3. Vital signs are reviewed  Lumbar spine: Limited flexion and extension secondary to pain. There is no tenderness along the lumbar midline but there is diffuse tenderness to palpation diffusely across the lumbosacral area. No spasm. Negative log roll bilaterally. Neurological exam: Negative straight leg raise bilaterally. Strength is 5/5 both lower extremities with reflexes 2/4 at the Achilles and patellar tendons bilaterally. No atrophy. Sensation is intact to light touch grossly. Good dorsalis pedis and posterior tibial pulses. Walking without significant limp.  MRI scan of his lumbar spine dated 06/30/2011 is reviewed he has mild degenerative disc disease from L3-S1. Bilateral facet hypertrophy at L5-S1 as well as on the right of L4-L5. There is moderate foraminal narrowing at L5-S1.       Assessment & Plan:  1.  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 I think we should try him on Mobic 15 mg daily for 7 days then when necessary. I will also send him for some limited physical therapy. There is an element of pain here which will likely remain chronic for him. I discussed the possibility of an IM Depo-Medrol injection if his symptoms warrant. Otherwise, I have little else to offer him in the form of treatment. Followup when necessary.

## 2012-08-23 ENCOUNTER — Ambulatory Visit: Payer: No Typology Code available for payment source | Attending: Sports Medicine | Admitting: Physical Therapy

## 2012-08-23 DIAGNOSIS — M545 Low back pain, unspecified: Secondary | ICD-10-CM | POA: Insufficient documentation

## 2012-08-23 DIAGNOSIS — IMO0001 Reserved for inherently not codable concepts without codable children: Secondary | ICD-10-CM | POA: Insufficient documentation

## 2012-08-23 DIAGNOSIS — R269 Unspecified abnormalities of gait and mobility: Secondary | ICD-10-CM | POA: Insufficient documentation

## 2012-08-23 DIAGNOSIS — R262 Difficulty in walking, not elsewhere classified: Secondary | ICD-10-CM | POA: Insufficient documentation

## 2012-08-28 ENCOUNTER — Ambulatory Visit: Payer: No Typology Code available for payment source

## 2012-08-28 ENCOUNTER — Other Ambulatory Visit: Payer: Self-pay | Admitting: Internal Medicine

## 2012-08-28 ENCOUNTER — Encounter: Payer: Self-pay | Admitting: Internal Medicine

## 2012-08-28 DIAGNOSIS — Z1211 Encounter for screening for malignant neoplasm of colon: Secondary | ICD-10-CM

## 2012-08-28 NOTE — Addendum Note (Signed)
Addended by: Dutchess Crosland, Luis Abed on: 08/28/2012 02:19 PM   Modules accepted: Orders

## 2012-08-30 ENCOUNTER — Emergency Department (HOSPITAL_COMMUNITY)
Admission: EM | Admit: 2012-08-30 | Discharge: 2012-08-30 | Disposition: A | Discharge status: Home / Self Care | Payer: No Typology Code available for payment source | Attending: Emergency Medicine | Admitting: Emergency Medicine

## 2012-08-30 ENCOUNTER — Ambulatory Visit: Payer: No Typology Code available for payment source | Admitting: Physical Therapy

## 2012-08-30 ENCOUNTER — Emergency Department (HOSPITAL_COMMUNITY): Payer: No Typology Code available for payment source

## 2012-08-30 ENCOUNTER — Encounter (HOSPITAL_COMMUNITY): Payer: Self-pay | Admitting: Cardiology

## 2012-08-30 DIAGNOSIS — R110 Nausea: Secondary | ICD-10-CM | POA: Insufficient documentation

## 2012-08-30 DIAGNOSIS — J45909 Unspecified asthma, uncomplicated: Secondary | ICD-10-CM | POA: Insufficient documentation

## 2012-08-30 DIAGNOSIS — R079 Chest pain, unspecified: Secondary | ICD-10-CM | POA: Insufficient documentation

## 2012-08-30 DIAGNOSIS — Z8739 Personal history of other diseases of the musculoskeletal system and connective tissue: Secondary | ICD-10-CM | POA: Insufficient documentation

## 2012-08-30 DIAGNOSIS — Z720 Tobacco use: Secondary | ICD-10-CM

## 2012-08-30 DIAGNOSIS — Z8719 Personal history of other diseases of the digestive system: Secondary | ICD-10-CM | POA: Insufficient documentation

## 2012-08-30 DIAGNOSIS — Z8709 Personal history of other diseases of the respiratory system: Secondary | ICD-10-CM | POA: Insufficient documentation

## 2012-08-30 DIAGNOSIS — J4489 Other specified chronic obstructive pulmonary disease: Secondary | ICD-10-CM | POA: Insufficient documentation

## 2012-08-30 DIAGNOSIS — M549 Dorsalgia, unspecified: Secondary | ICD-10-CM | POA: Insufficient documentation

## 2012-08-30 DIAGNOSIS — F17200 Nicotine dependence, unspecified, uncomplicated: Secondary | ICD-10-CM | POA: Insufficient documentation

## 2012-08-30 DIAGNOSIS — J449 Chronic obstructive pulmonary disease, unspecified: Secondary | ICD-10-CM | POA: Insufficient documentation

## 2012-08-30 LAB — BASIC METABOLIC PANEL
BUN: 13 mg/dL (ref 6–23)
Chloride: 103 mEq/L (ref 96–112)
Glucose, Bld: 90 mg/dL (ref 70–99)
Potassium: 4.1 mEq/L (ref 3.5–5.1)

## 2012-08-30 LAB — CBC
HCT: 45.7 % (ref 39.0–52.0)
Hemoglobin: 16 g/dL (ref 13.0–17.0)
MCHC: 35 g/dL (ref 30.0–36.0)

## 2012-08-30 MED ORDER — ASPIRIN 81 MG PO CHEW: 81.0000 mg | CHEWABLE_TABLET | Freq: Every day | ORAL | Status: DC

## 2012-08-30 NOTE — ED Provider Notes (Signed)
Medical screening examination/treatment/procedure(s) were performed by non-physician practitioner and as supervising physician I was immediately available for consultation/collaboration.  Blayton Huttner, MD 08/30/12 [id number] 

## 2012-08-30 NOTE — ED Provider Notes (Signed)
CSN: [id number]     Arrival date & time 08/30/12  1706 History     First MD Initiated Contact with Patient 08/30/12 2001     Chief Complaint  Patient presents with  . Chest Pain   (Consider location/radiation/quality/duration/timing/severity/associated sxs/prior Treatment) HPI  Vernon Carpenter is a 52 y.o. male sent from physical therapy for evaluation of chest pain. Patient has had no chest pain today; he has not had chest pain in over a month. The patient describes his chest pain as left anterior described as tightwith a sore left arm and nausea. When he has these episodes they typically last for 7-8 minutes, they're nonexertional, not associated with shortness above of breath above his baseline, diaphoresis.  RF: active daily smoker Cath:?  Last Stress test: ?  Cardiologost: None PCP: [facility]  Past Medical History  Diagnosis Date  . Acute and chronic respiratory failure   . BACK PAIN   . CHEST PAIN     12/2011 seen by Dr. Eden Emms and cleared for hernia repair surgery  . Plantar fasciitis of left foot   . Neck pain   . Cough   . Umbilical hernia   . COPD (chronic obstructive pulmonary disease)   . Asthma   . Arthritis    Past Surgical History  Procedure Laterality Date  . S/p cervical discectomy      C6-C7  . Hernia repair      L inguinal  . Umbilical hernia repair  01/10/2012    Procedure: HERNIA REPAIR UMBILICAL ADULT;  Surgeon: Atilano Ina, MD,FACS;  Location: [facility] OR;  Service: General;  Laterality: N/A;  . Insertion of mesh  01/10/2012    Procedure: INSERTION OF MESH;  Surgeon: Atilano Ina, MD,FACS;  Location: [facility] OR;  Service: General;  Laterality: N/A;   Family History  Problem Relation Age of Onset  . Heart disease Father   . Diabetes Father   . Hypertension Father   . Diabetes Mother   . Hypertension Mother    History  Substance Use Topics  . Smoking status: Current Every Day Smoker -- 0.50 packs/day    Types: Cigarettes  . Smokeless  tobacco: Current User    Types: Chew  . Alcohol Use: No    Review of Systems  10 systems reviewed and found to be negative, except as noted in the HPI    Allergies  Review of patient's allergies indicates no known allergies.  Home Medications   Current Outpatient Rx  Name  Route  Sig  Dispense  Refill  . gabapentin (NEURONTIN) 300 MG capsule   Oral   Take 1 capsule (300 mg total) by mouth 3 (three) times daily.   90 capsule   2   . HYDROcodone-acetaminophen (NORCO) 10-325 MG per tablet   Oral   Take 1 tablet by mouth every 6 (six) hours as needed for pain.   120 tablet   5     Do not fill until 09/17/12   . meloxicam (MOBIC) 15 MG tablet   Oral   Take 15 mg by mouth daily. Take one tablet a day for 7 days then as needed for pain.         Marland Kitchen PRESCRIPTION MEDICATION   Each Nare   Place 1 spray into both nostrils as needed (congestion).          BP 150/91  Pulse 72  Temp(Src) 97.4 F (36.3 C) (Oral)  SpO2 93% Physical Exam  Nursing note  and vitals reviewed. Constitutional: He is oriented to person, place, and time. He appears well-developed and well-nourished. No distress.  HENT:  Head: Normocephalic.  Mouth/Throat: Oropharynx is clear and moist.  Eyes: Conjunctivae and EOM are normal. Pupils are equal, round, and reactive to light.  Neck: Normal range of motion. No JVD present.  Cardiovascular: Normal rate, regular rhythm and intact distal pulses.   Pulmonary/Chest: Effort normal and breath sounds normal. No stridor. No respiratory distress. He has no wheezes. He has no rales. He exhibits no tenderness.  Abdominal: Soft. Bowel sounds are normal. He exhibits no distension and no mass. There is no tenderness. There is no rebound and no guarding.  Musculoskeletal: Normal range of motion. He exhibits no edema.  Neurological: He is alert and oriented to person, place, and time.  Psychiatric: He has a normal mood and affect.    ED Course   Procedures (including  critical care time)  Labs Reviewed  CBC  BASIC METABOLIC PANEL  POCT I-STAT TROPONIN I   Dg Chest 2 View  08/30/2012   *RADIOLOGY REPORT*  Clinical Data: Chest pain  CHEST - 2 VIEW  Comparison: Chest radiograph 11/02/2011  Findings: The heart, mediastinal, and hilar contours are normal. The pulmonary vascularity is normal.  The lungs are well expanded and clear.  Negative for pleural effusion.  No acute osseous abnormality.  Remote healed left ninth and eleventh rib fractures are stable. Postsurgical changes of the lower cervical spine noted.  IMPRESSION: No acute cardiopulmonary disease.   Original Report Authenticated By: Britta Mccreedy, M.D.    Date: 08/30/2012  Rate: 67  Rhythm: normal sinus rhythm  QRS Axis: normal  Intervals: normal  ST/T Wave abnormalities: normal  Conduction Disutrbances:none  Narrative Interpretation:   Old EKG Reviewed: unchanged   1. Chest pain   2. Back pain   3. Tobacco use     MDM   Filed Vitals:   08/30/12 1715  BP: 150/91  Pulse: 72  Temp: 97.4 F (36.3 C)  TempSrc: Oral  SpO2: 93%     Vernon Carpenter is a 52 y.o. male sent from physical therapy for evaluation of chest pain. However patient has not had any chest pain in the last 30 days. His description of prior episodes is concerning for anginal origin. EKG is nonischemic, chest x-ray is negative, troponin is normal and blood work is unremarkable.  I discussed the case with attending Dr. Ethelda Chick we do not feel further workup is indicated in the emergency room at this time. [facility] Dr. Claudell Kyle.  He has an appointment on August 3. I have advised the patient that it is critically important that he discuss these episodes with his primary care physician because cardiology workup may be indicated. Patient has verbalized his understanding and seems reliable for followup.  Smoking cessation advised, I've advised patient to start daily low dose aspirin  and we have discussed return precautions.  Pt is hemodynamically stable, appropriate for, and amenable to discharge at this time. Pt verbalized understanding and agrees with care plan. All questions answered. Outpatient follow-up and specific return precautions discussed.    New Prescriptions   ASPIRIN 81 MG CHEWABLE TABLET    Chew 1 tablet (81 mg total) by mouth daily.    Note: Portions of this report may have been transcribed using voice recognition software. Every effort was made to ensure accuracy; however, inadvertent computerized transcription errors may be present    Wynetta Emery, PA-C  08/30/12 2114  Wynetta Emery, PA-C 08/30/12 2149

## 2012-08-30 NOTE — ED Notes (Signed)
Pt reports that he went to therapy for his back today and started having pain across the left side of his chest. States that the pain has been intermittent for a couple of years. States that when he takes a hot shower the pain comes back.

## 2012-09-04 ENCOUNTER — Telehealth: Payer: Self-pay | Admitting: *Deleted

## 2012-09-04 ENCOUNTER — Ambulatory Visit: Payer: No Typology Code available for payment source | Attending: Sports Medicine | Admitting: Physical Therapy

## 2012-09-04 DIAGNOSIS — M545 Low back pain, unspecified: Secondary | ICD-10-CM | POA: Insufficient documentation

## 2012-09-04 DIAGNOSIS — R269 Unspecified abnormalities of gait and mobility: Secondary | ICD-10-CM | POA: Insufficient documentation

## 2012-09-04 DIAGNOSIS — R262 Difficulty in walking, not elsewhere classified: Secondary | ICD-10-CM | POA: Insufficient documentation

## 2012-09-04 DIAGNOSIS — IMO0001 Reserved for inherently not codable concepts without codable children: Secondary | ICD-10-CM | POA: Insufficient documentation

## 2012-09-04 NOTE — Telephone Encounter (Signed)
Last refill 08/22/12 #120. Concern for misuse high in this patient given abnormal UDS x2. Please ask patient to make an appointment with the clinic to discuss these issues and review pain contract terms. Not comfortable giving a refill at this time.

## 2012-09-04 NOTE — Telephone Encounter (Signed)
Pt called for a refill on pain med, called pharm, last filled 08/22/2012 #120

## 2012-09-06 ENCOUNTER — Ambulatory Visit (INDEPENDENT_AMBULATORY_CARE_PROVIDER_SITE_OTHER): Payer: No Typology Code available for payment source | Admitting: Internal Medicine

## 2012-09-06 ENCOUNTER — Ambulatory Visit: Payer: No Typology Code available for payment source | Admitting: Physical Therapy

## 2012-09-06 VITALS — BP 154/98 | HR 69 | Temp 97.2°F | Ht 64.0 in | Wt 177.8 lb

## 2012-09-06 DIAGNOSIS — K219 Gastro-esophageal reflux disease without esophagitis: Secondary | ICD-10-CM

## 2012-09-06 DIAGNOSIS — H9202 Otalgia, left ear: Secondary | ICD-10-CM | POA: Insufficient documentation

## 2012-09-06 DIAGNOSIS — R079 Chest pain, unspecified: Secondary | ICD-10-CM

## 2012-09-06 DIAGNOSIS — R0602 Shortness of breath: Secondary | ICD-10-CM

## 2012-09-06 DIAGNOSIS — H9209 Otalgia, unspecified ear: Secondary | ICD-10-CM

## 2012-09-06 DIAGNOSIS — R072 Precordial pain: Secondary | ICD-10-CM | POA: Insufficient documentation

## 2012-09-06 DIAGNOSIS — R739 Hyperglycemia, unspecified: Secondary | ICD-10-CM

## 2012-09-06 DIAGNOSIS — F17200 Nicotine dependence, unspecified, uncomplicated: Secondary | ICD-10-CM

## 2012-09-06 DIAGNOSIS — I10 Essential (primary) hypertension: Secondary | ICD-10-CM

## 2012-09-06 DIAGNOSIS — J449 Chronic obstructive pulmonary disease, unspecified: Secondary | ICD-10-CM

## 2012-09-06 DIAGNOSIS — Z0000 Encounter for general adult medical examination without abnormal findings: Secondary | ICD-10-CM | POA: Insufficient documentation

## 2012-09-06 DIAGNOSIS — R7309 Other abnormal glucose: Secondary | ICD-10-CM

## 2012-09-06 DIAGNOSIS — Z720 Tobacco use: Secondary | ICD-10-CM

## 2012-09-06 LAB — GLUCOSE, CAPILLARY: Glucose-Capillary: 103 mg/dL — ABNORMAL HIGH (ref 70–99)

## 2012-09-06 LAB — POCT GLYCOSYLATED HEMOGLOBIN (HGB A1C): Hemoglobin A1C: 5.5

## 2012-09-06 MED ORDER — RANITIDINE HCL 150 MG PO TABS: 150.0000 mg | ORAL_TABLET | Freq: Two times a day (BID) | ORAL | Status: DC

## 2012-09-06 MED ORDER — ALBUTEROL SULFATE HFA 108 (90 BASE) MCG/ACT IN AERS: 2.0000 | INHALATION_SPRAY | Freq: Four times a day (QID) | RESPIRATORY_TRACT | Status: DC | PRN

## 2012-09-06 MED ORDER — LISINOPRIL 20 MG PO TABS: 20.0000 mg | ORAL_TABLET | Freq: Every day | ORAL | Status: DC

## 2012-09-06 MED ORDER — ANTIPYRINE-BENZOCAINE 5.4-1.4 % OT SOLN: 3.0000 [drp] | OTIC | Status: DC | PRN

## 2012-09-06 NOTE — Assessment & Plan Note (Signed)
Left ear canal irritated, slightly swollen, does not appear infected. Likely secondary to scratching. Pt advised to not scratch ear canals and not "dig" wth Qtip.  Rx benzocaine otic solution PRN ($4 [employer] list)

## 2012-09-06 NOTE — Assessment & Plan Note (Addendum)
Stable. Albuterol inhaler is too costly, he will apply to MAP to fill this Rx at the [facility]. Encouraged smoking cessation.  Rx albuterol inhaler

## 2012-09-06 NOTE — Assessment & Plan Note (Signed)
  Assessment: Progress toward smoking cessation:  smoking less Barriers to progress toward smoking cessation:  none Comments: He has cut back on the amount of cigarettes per day and will further cut back. He tried the [employer] but ran out of minutes. He will try again later.    Plan: Instruction/counseling given:  I counseled patient on the dangers of tobacco use, advised patient to stop smoking, and reviewed strategies to maximize success. Educational resources provided:  [employer]) brochure Self management tools provided:    Medications to assist with smoking cessation:  None Patient agreed to the following self-care plans for smoking cessation: cut down the number of cigarettes smoked;call [employer] (1-800-QUIT-NOW)  Other plans: He is interested in nicotine patches once he further cuts down on his cigarette smoking.

## 2012-09-06 NOTE — Patient Instructions (Addendum)
General Instructions: - You need to apply for MAP in order to fill your albuterol inhaler prescription.  -Start taking Lisinopril for your blood pressure.  -Do not take Mobic and aspirin at that same time.  -Start taking Ranitidine for your chest discomfort as this could be heart burn.  -You may use the ear drops in your left ear for the pain. Avoid scratching your ear. You may use a Q tip gently to clean your ears.  -Call the 1-800-QUIT-NOW line again for help with quitting smoking.  -Follow up in two weeks for your blood pressure.    Treatment Goals:  Goals (1 Years of Data) as of 09/06/12         As of Today 08/30/12 08/20/12 08/02/12 06/12/12     Blood Pressure    . Blood Pressure < 140/90  154/98 150/91 127/90 147/92 137/87      Progress Toward Treatment Goals:  Treatment Goal 09/06/2012  Blood pressure unchanged  Stop smoking smoking less    Self Care Goals & Plans:  Self Care Goal 09/06/2012  Manage my medications take my medicines as prescribed; bring my medications to every visit; refill my medications on time; follow the sick day instructions if I am sick  Eat healthy foods eat more vegetables; eat fruit for snacks and desserts; eat baked foods instead of fried foods; drink diet soda or water instead of juice or soda; eat foods that are low in salt  Be physically active take a walk every day; find an activity I enjoy  Stop smoking cut down the number of cigarettes smoked; call [employer] (1-800-QUIT-NOW)       Care Management & Community Referrals:  Referral 09/06/2012  Referrals made for care management support none needed

## 2012-09-06 NOTE — Assessment & Plan Note (Addendum)
Unlikely to be cardiac in nature given its duration of over one month and recent negative work up. He has TTP on physical exam over his left chest suggesting MSK pain. GERD could also explain some of his discomfort. He agrees to try Ranitidine and monitor his CP symptoms.  Ranitidine 150mg  BID ($4 [facility] list) Pt to follow up in two weeks. Will consider further cardiac work up at that time.

## 2012-09-06 NOTE — Assessment & Plan Note (Signed)
Screening for DM2 today, with HgA1c of 5.5%, he does not have diabetes. Discussed results with pt.

## 2012-09-06 NOTE — Progress Notes (Signed)
  Subjective:    Patient ID: Vernon Carpenter, male    DOB: October 18, 1960, 52 y.o.   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 He was seen at the ED on 7/31 for chest pain x1 month in his left chest that is constant ache 6/10 and does not radiate. His EKG had NSR and no ST elevation, his troponin was negative. He was started on ASA 81mg  and discharged home with recommendation to follow with the [facility] for his elevated BP. Today he comes in with a note from his physical therapist with documented elevated BP. His BP has been elevated to the 150-160s/100. He denies headache or blurry vision. Today he also complains of left ear pain and "fullness". He has been scratching his ear.    Review of Systems  Constitutional: Negative for fever, chills, diaphoresis, activity change and appetite change.  HENT: Positive for ear pain and congestion. Negative for sore throat, rhinorrhea, postnasal drip, tinnitus and ear discharge.   Eyes: Negative for pain and discharge.  Respiratory: Positive for chest tightness and shortness of breath. Negative for cough and wheezing.   Cardiovascular: Positive for chest pain. Negative for palpitations and leg swelling.  Gastrointestinal: Negative for abdominal pain.  Musculoskeletal:       Chronic neck pain  Skin: Negative for color change, pallor, rash and wound.  Neurological: Negative for dizziness, light-headedness and headaches.  Hematological: Negative for adenopathy.  Psychiatric/Behavioral: Negative for behavioral problems and agitation.       Objective:   Physical Exam  Nursing note and vitals reviewed. Constitutional: He is oriented to person, place, and time. He appears well-developed. No distress.  Obese  HENT:  Head: Normocephalic and atraumatic.  Right Ear: External ear normal.   Left TM with light reflex, ear canal with edema and erythema at 2 o'clock with no bleeding or discharge.  Right TM not well visualized due to cerumen, ear canal appears intact.   Eyes: Conjunctivae are normal. Right eye exhibits no discharge. Left eye exhibits no discharge. No scleral icterus.  Cardiovascular: Normal rate and regular rhythm.   Pulmonary/Chest: Effort normal and breath sounds normal. No respiratory distress. He has no wheezes. He has no rales. He exhibits tenderness.  TTP over the lower left chest.  Abdominal: Soft.  Musculoskeletal: Normal range of motion. He exhibits tenderness. He exhibits no edema.  Left chest tenderness  Neurological: He is alert and oriented to person, place, and time.  Skin: Skin is warm and dry. No rash noted. He is not diaphoretic. No erythema. No pallor.  Psychiatric: He has a normal mood and affect. His behavior is normal.          Assessment & Plan:

## 2012-09-06 NOTE — Assessment & Plan Note (Addendum)
BP Readings from Last 3 Encounters:  09/06/12 154/98  08/30/12 150/91  08/20/12 127/90    Lab Results  Component Value Date   NA 139 08/30/2012   K 4.1 08/30/2012   CREATININE 0.95 08/30/2012    Assessment: Blood pressure control: mildly elevated Progress toward BP goal:  unchanged Comments: This is his first encounter for this problem. He has at least 3 separate BP readings above 150/90.   Plan: Medications:  Will start him on Lisinopril 20mg  daily (on [employer] List) Educational resources provided:   Self management tools provided:   Other plans: Pt to return in 2 weeks for BP reassessment and side effects evaluation. Will consider BMET for K and Na trending.

## 2012-09-07 NOTE — Progress Notes (Signed)
Case discussed with Dr. Kennerly soon after the resident saw the patient.  We reviewed the resident's history and exam and pertinent patient test results.  I agree with the assessment, diagnosis, and plan of care documented in the resident's note. 

## 2012-09-08 ENCOUNTER — Encounter: Payer: Self-pay | Admitting: Internal Medicine

## 2012-09-08 ENCOUNTER — Encounter (HOSPITAL_COMMUNITY): Payer: Self-pay | Admitting: Emergency Medicine

## 2012-09-08 ENCOUNTER — Emergency Department (INDEPENDENT_AMBULATORY_CARE_PROVIDER_SITE_OTHER)
Admission: EM | Admit: 2012-09-08 | Discharge: 2012-09-08 | Disposition: A | Discharge status: Home / Self Care | Payer: No Typology Code available for payment source | Source: Home / Self Care

## 2012-09-08 DIAGNOSIS — H6123 Impacted cerumen, bilateral: Secondary | ICD-10-CM

## 2012-09-08 DIAGNOSIS — H6120 Impacted cerumen, unspecified ear: Secondary | ICD-10-CM

## 2012-09-08 NOTE — ED Provider Notes (Signed)
Vernon Carpenter is a 52 y.o. male who presents to Urgent Care today for bilateral left worse than right decreased hearing, ear pressure and mild ear pain. This is been present for about one year but worse recently. He was seen by his primary care provider who provided him with benzocaine otic solution after an unsuccessful irrigation attempt. Denies any fevers or chills nausea vomiting or diarrhea. He has not tried any medication. He is using his finger and a Q-tip to try to remove the wax build up.    PMH reviewed. COPD, chronic back pain History  Substance Use Topics  . Smoking status: Current Every Day Smoker -- 0.50 packs/day    Types: Cigarettes  . Smokeless tobacco: Current User    Types: Chew  . Alcohol Use: No   ROS as above Medications reviewed. No current facility-administered medications for this encounter.   Current Outpatient Prescriptions  Medication Sig Dispense Refill  . gabapentin (NEURONTIN) 300 MG capsule Take 1 capsule (300 mg total) by mouth 3 (three) times daily.  90 capsule  2  . HYDROcodone-acetaminophen (NORCO) 10-325 MG per tablet Take 1 tablet by mouth every 6 (six) hours as needed for pain.  120 tablet  5  . lisinopril (PRINIVIL,ZESTRIL) 20 MG tablet Take 1 tablet (20 mg total) by mouth daily.  30 tablet  11  . meloxicam (MOBIC) 15 MG tablet Take 15 mg by mouth daily. Take one tablet a day for 7 days then as needed for pain.      Marland Kitchen albuterol (PROVENTIL HFA;VENTOLIN HFA) 108 (90 BASE) MCG/ACT inhaler Inhale 2 puffs into the lungs every 6 (six) hours as needed for wheezing.  1 Inhaler  2  . antipyrine-benzocaine (AURALGAN) otic solution Place 3 drops into the left ear every 2 (two) hours as needed for pain.  10 mL  0  . aspirin 81 MG chewable tablet Chew 1 tablet (81 mg total) by mouth daily.  30 tablet  3  . PRESCRIPTION MEDICATION Place 1 spray into both nostrils as needed (congestion).      . ranitidine (ZANTAC) 150 MG tablet Take 1 tablet (150 mg total) by  mouth 2 (two) times daily.  60 tablet  1    Exam:  BP 159/88  Pulse 64  Temp(Src) 98 F (36.7 C) (Oral)  Resp 20  SpO2 96% Gen: Well NAD HEENT: EOMI,  MMM, bilateral ear canals occluded by cerumen.  Lungs: CTABL Nl WOB Heart: RRR no MRG Abd: NABS, NT, ND Exts: Non edematous BL  LE, warm and well perfused.   Patient has successful removal of the cerumen with irrigation. The tympanic membranes were nonerythematous and normal-appearing following the irrigation.    He had subjective improvement in his symptoms following the irrigation  No results found for this or any previous visit (from the past 24 hour(s)). No results found.  Assessment and Plan: 52 y.o. male with bilateral cerumen impaction successfully removed with irrigation.  This resolved the patient's symptoms. Followup with primary care provider as needed. Discussed warning signs or symptoms. Please see discharge instructions. Patient expresses understanding.      Rodolph Bong, MD 09/08/12 1425

## 2012-09-08 NOTE — ED Notes (Signed)
Pt c/o bilateral ear discomfort and decreased hearing onset 1 yr Denies: fevers... He is alert w/no signs of acute distress.

## 2012-09-20 ENCOUNTER — Encounter: Payer: Self-pay | Admitting: Internal Medicine

## 2012-09-20 ENCOUNTER — Ambulatory Visit (INDEPENDENT_AMBULATORY_CARE_PROVIDER_SITE_OTHER): Payer: No Typology Code available for payment source | Admitting: Internal Medicine

## 2012-09-20 VITALS — BP 125/86 | HR 73 | Temp 97.0°F | Wt 174.4 lb

## 2012-09-20 DIAGNOSIS — F17200 Nicotine dependence, unspecified, uncomplicated: Secondary | ICD-10-CM

## 2012-09-20 DIAGNOSIS — G4710 Hypersomnia, unspecified: Secondary | ICD-10-CM

## 2012-09-20 DIAGNOSIS — R400 Somnolence: Secondary | ICD-10-CM

## 2012-09-20 DIAGNOSIS — I10 Essential (primary) hypertension: Secondary | ICD-10-CM

## 2012-09-20 DIAGNOSIS — Z720 Tobacco use: Secondary | ICD-10-CM

## 2012-09-20 DIAGNOSIS — R079 Chest pain, unspecified: Secondary | ICD-10-CM

## 2012-09-20 LAB — BASIC METABOLIC PANEL WITH GFR
BUN: 15 mg/dL (ref 6–23)
Creat: 1 mg/dL (ref 0.50–1.35)
GFR, Est African American: >89 mL/min
GFR, Est Non African American: 87 mL/min

## 2012-09-20 NOTE — Patient Instructions (Addendum)
-  You have been referred to Cardiology. We will call you with the appointment.  -You have been referred to the Sleep Study. They will contact you with the information about this study.  -Continue taking your blood pressure medication every day.  -Follow up with Dr. Claudell Kyle, your PCP, in 2-3 months for routine follow up visit.

## 2012-09-21 ENCOUNTER — Other Ambulatory Visit: Payer: Self-pay | Admitting: *Deleted

## 2012-09-21 NOTE — Progress Notes (Signed)
  Subjective:    Patient ID: Vernon Carpenter, male    DOB: 1960/09/11, 52 y.o.   MRN: [id number]  HPI Mr. Gergen is a 52 year old man with PMH significant for COPD, HTN, and cervical disc disease with chronic neck pain who presents for follow up of his HTN and chest pain. He has been taking Lisinopril daily with no dizziness. A few days ago he had a headache while he was taking Mobic and aspirin. He stopped taking Mobic and his headache subsided.  His chest pain is a constant ache that has been present for one year in his left lower chest. This pain sometimes becomes more intense, 7/10, sometimes accompanied by left arm numbness and shortness of breath. The pain is not related to food intake, cough, and is present at rest or on exertion. He has been taking Ranitidine since his last visit and noticed a change of this chest pain with this medication.  He had left ear pain and cerumen impaction that have now resolved after his visit to the Urgent Care.          Review of Systems  Constitutional: Negative for fever, chills, diaphoresis, appetite change and fatigue.  HENT: Negative for ear pain, congestion, rhinorrhea and postnasal drip.   Respiratory: Negative for cough, shortness of breath and wheezing.   Cardiovascular: Positive for chest pain. Negative for palpitations and leg swelling.  Gastrointestinal: Negative for nausea and abdominal pain.  Genitourinary: Negative for dysuria.  Musculoskeletal: Positive for back pain.       Chronic neck pain  Neurological: Negative for dizziness, tremors, syncope, light-headedness, numbness and headaches.  Hematological: Negative for adenopathy.  Psychiatric/Behavioral: Negative for behavioral problems and agitation.       Objective:   Physical Exam  Nursing note and vitals reviewed. Constitutional: He appears well-developed. No distress.  Obese  Eyes: Conjunctivae are normal. No scleral icterus.  Cardiovascular: Normal rate and regular rhythm.    Pulmonary/Chest: Effort normal and breath sounds normal. No respiratory distress. He has no wheezes. He has no rales. He exhibits no tenderness.  Abdominal: Soft.  Musculoskeletal: He exhibits no edema and no tenderness.  Neurological: He is alert.  Skin: Skin is warm and dry. No rash noted. He is not diaphoretic. No erythema. No pallor.  Psychiatric: He has a normal mood and affect. His behavior is normal.          Assessment & Plan:

## 2012-09-21 NOTE — Assessment & Plan Note (Addendum)
His chest pain is persistent and recurrent making it less likely to be cardiac. He has had work up in the ED for this with no EKG changes and negative troponin (J point repolarization on EKG at this baseline). He does have risk factors for CAD and, therefore, warrants further evaluation of this chest pain.  -Referred to Cardiology for possible nuclear stress test. -Pt to continue taking ASA 81mg  daily. -Pt wants to continue taking Ranitidine for his stomach though it has not decreased his chest pain.

## 2012-09-21 NOTE — Telephone Encounter (Signed)
Last rx was written in June w/5 refills by Dr Anselm Jungling [facility] states they did not receive this rx; last rx they have was written in Feb. Since Dr Anselm Jungling is no longer her, they cannot accept rx (written in June). Pt requesting a refill.  Thanks

## 2012-09-21 NOTE — Assessment & Plan Note (Signed)
BP Readings from Last 3 Encounters:  09/20/12 125/86  09/08/12 159/88  09/06/12 154/98    Lab Results  Component Value Date   NA 140 09/20/2012   K 4.5 09/20/2012   CREATININE 1.00 09/20/2012    Assessment: Blood pressure control:   Controlled Progress toward BP goal:   At goal Comments: Started on Lisinopril 20mg  daily last week.   Plan: Medications:  continue current medications Educational resources provided:  HTN brochure Self management tools provided:   Other plans: Follow up in 2-3 months.

## 2012-09-22 DIAGNOSIS — G4700 Insomnia, unspecified: Secondary | ICD-10-CM | POA: Insufficient documentation

## 2012-09-22 NOTE — Assessment & Plan Note (Signed)
He describes waking up tired, with several jerking movements at night, and concern for sleep apnea.  -Ordered sleep study.

## 2012-09-22 NOTE — Assessment & Plan Note (Signed)
  Assessment: Progress toward smoking cessation:   Smoking less Barriers to progress toward smoking cessation:   Smoker in the house, his wife Comments: He would like to continue cutting back on smoking. He wants to further discuss smoking cessation with his wife.   Plan: Instruction/counseling given:  I counseled patient on the dangers of tobacco use, advised patient to stop smoking, and reviewed strategies to maximize success. Educational resources provided:   1-800-QUIT-NOW Self management tools provided:    Medications to assist with smoking cessation:  None Patient agreed to the following self-care plans for smoking cessation: cut down the number of cigarettes smoked;go to the [employer] website ([employer])  Other plans: Pt declines pharmacological assistance for smoking cessation at this time, wants to continue cutting back. Would discuss smoking cessation again during his next visit.

## 2012-09-23 NOTE — Progress Notes (Signed)
Case discussed with Dr. Kennerly immediately after the visit. We reviewed the resident's history and exam and pertinent patient test results. I agree with the assessment, diagnosis and plan of care documented in the resident's note. 

## 2012-09-24 MED ORDER — HYDROCODONE-ACETAMINOPHEN 10-325 MG PO TABS: 1.0000 | ORAL_TABLET | Freq: Four times a day (QID) | ORAL | Status: DC | PRN

## 2012-09-24 NOTE — Telephone Encounter (Signed)
Sorry, what medication(s) does he need? I may be missing it, but I can't find it it in your message.  Thanks,  Maralyn Sago

## 2012-09-25 NOTE — Telephone Encounter (Signed)
Hydrocodone rx called to [facility] pharmacy.

## 2012-10-02 ENCOUNTER — Ambulatory Visit (INDEPENDENT_AMBULATORY_CARE_PROVIDER_SITE_OTHER): Payer: No Typology Code available for payment source | Admitting: Physician Assistant

## 2012-10-02 ENCOUNTER — Encounter: Payer: Self-pay | Admitting: Physician Assistant

## 2012-10-02 VITALS — BP 132/90 | HR 68 | Ht 64.0 in | Wt 173.8 lb

## 2012-10-02 DIAGNOSIS — F17200 Nicotine dependence, unspecified, uncomplicated: Secondary | ICD-10-CM

## 2012-10-02 DIAGNOSIS — R079 Chest pain, unspecified: Secondary | ICD-10-CM

## 2012-10-02 DIAGNOSIS — Z720 Tobacco use: Secondary | ICD-10-CM

## 2012-10-02 DIAGNOSIS — I10 Essential (primary) hypertension: Secondary | ICD-10-CM

## 2012-10-02 DIAGNOSIS — Z0000 Encounter for general adult medical examination without abnormal findings: Secondary | ICD-10-CM

## 2012-10-02 MED ORDER — NAPROXEN 500 MG PO TABS: 500.0000 mg | ORAL_TABLET | Freq: Two times a day (BID) | ORAL | Status: AC

## 2012-10-02 NOTE — Patient Instructions (Addendum)
A PRESCRIPTION FOR NAPROSYN 500 MG 1 TAB TWICE DAILY . HAS BEEN SENT IN TO TAKE FOR 10 DAYS THEN STOP; MAKE SURE TO TAKE YOUR ZANTAC TWICE DAILY WHILE YOU ARE TAKING THE NAPROSYN  PLEASE FOLLOW UP WITH DR. Eden Emms 11/07/12 @ 2:30

## 2012-10-02 NOTE — Progress Notes (Signed)
[address] [place], [place]  [address] Phone: [phone] Fax:  [phone]  Date:  10/02/2012   ID:  Vernon Carpenter, DOB [birth year], MRN [id number]  PCP:  Vivi Barrack, MD  Cardiologist:  Dr. Charlton Haws    History of Present Illness: Vernon Carpenter is a 52 y.o. male who returns for the evaluation of chest pain.  He has a hx of HTN, COPD, back pain, ongoing tobacco abuse. He was seen by Dr. Eden Emms in 2012 for chest pain and randomized to cardiac CTA as part of the Promise Trial. Cardiac CTA 01/2010: Calcium score 0, no significant CAD, distal circumflex and PDA not well visualized; overall low risk cardiac CT.  Last seen by Dr. Eden Emms 12/2011 for surgical clearance. He was cleared for hernia repair at that time.   He was seen in the ED in 07/2012 with CP.  CEs were neg.  CXR was neg for anything acute.  Of note, he has evidence of old healed rib fractures on the left.  He was seen in follow up by his PCP and referred here for further evaluation.  He notes L sided CP for ~ 2 years.  States it is tight "like a vice."  He notes it gets worse at times and may last 1-2 minutes. No real exacerbating factors.  Denies significant worsening with exertion.  He denies any radiating symptoms.  No associated nausea.  He does note diaphoresis.  He does note DOE.  He is limited by his back pain.  No orthopnea, PND, edema.    Wt Readings from Last 3 Encounters:  09/20/12 174 lb 6.4 oz (79.107 kg)  09/06/12 177 lb 12.8 oz (80.65 kg)  08/20/12 170 lb (77.111 kg)     Past Medical History  Diagnosis Date  . Acute and chronic respiratory failure   . BACK PAIN   . CHEST PAIN     12/2011 seen by Dr. Eden Emms and cleared for hernia repair surgery  . Plantar fasciitis of left foot   . Neck pain   . Cough   . Umbilical hernia   . COPD (chronic obstructive pulmonary disease)   . Asthma   . Arthritis     Current Outpatient Prescriptions  Medication Sig Dispense Refill  .  albuterol (PROVENTIL HFA;VENTOLIN HFA) 108 (90 BASE) MCG/ACT inhaler Inhale 2 puffs into the lungs every 6 (six) hours as needed for wheezing.  1 Inhaler  2  . antipyrine-benzocaine (AURALGAN) otic solution Place 3 drops into the left ear every 2 (two) hours as needed for pain.  10 mL  0  . aspirin 81 MG chewable tablet Chew 1 tablet (81 mg total) by mouth daily.  30 tablet  3  . gabapentin (NEURONTIN) 300 MG capsule Take 1 capsule (300 mg total) by mouth 3 (three) times daily.  90 capsule  2  . HYDROcodone-acetaminophen (NORCO) 10-325 MG per tablet Take 1 tablet by mouth every 6 (six) hours as needed for pain.  30 tablet  0  . HYDROcodone-acetaminophen (NORCO) 10-325 MG per tablet Take 1 tablet by mouth every 6 (six) hours as needed for pain.  120 tablet  1  . HYDROcodone-acetaminophen (NORCO) 7.5-325 MG per tablet Take 1 tablet by mouth every 4 (four) hours as needed for pain.  120 tablet  5  . lisinopril (PRINIVIL,ZESTRIL) 20 MG tablet Take 1 tablet (20 mg total) by mouth daily.  30 tablet  11  . PRESCRIPTION MEDICATION Place 1 spray into  both nostrils as needed (congestion).      . ranitidine (ZANTAC) 150 MG tablet Take 1 tablet (150 mg total) by mouth 2 (two) times daily.  60 tablet  1   No current facility-administered medications for this visit.    Allergies:   No Known Allergies  Social History:  The patient  reports that he has been smoking Cigarettes.  He has been smoking about 0.50 packs per day. His smokeless tobacco use includes Chew. He reports that he does not drink alcohol or use illicit drugs.   ROS:  Please see the history of present illness.   He notes a chronic cough and wheezing.   All other systems reviewed and negative.   PHYSICAL EXAM: VS:  BP 132/90  Pulse 68  Ht 5\' 4"  (1.626 m)  Wt 173 lb 12.8 oz (78.835 kg)  BMI 29.82 kg/m2 Well nourished, well developed, in no acute distress HEENT: normal Neck: no JVD Cardiac:  normal S1, S2; RRR; no murmur Chest: Left chest  tender to palpation (this reproduced his presenting symptoms) Lungs:  Decreased breath sounds bilaterally, no wheezing, rhonchi or rales Abd: soft, nontender, no hepatomegaly Ext: no edema Skin: warm and dry Neuro:  CNs 2-12 intact, no focal abnormalities noted  EKG:  NSR, HR 68, normal axis, J-point elevation, no change when compared to prior tracing dated 05/31/2011     ASSESSMENT AND PLAN:  1. Chest Pain: Atypical for ischemia. He had a cardiac CTA 01/2010 with a calcium score of 0 and no significant CAD. The likelihood of significant coronary disease is low.  He is unable to walk on treadmill due to his back.  Therefore, we cannot do a regular ETT. At this point,  I would not recommend proceeding with a nuclear study. His symptoms are suspicious for chest wall pain. I will have him start Naprosyn 500 mg twice a day for 10 days. He will take this with food and continue his H2RA for GI protection. I reviewed his case today with Dr. Jens Som (DOD) who agreed. He will come back in close follow up with Dr. Eden Emms to review symptoms and arrange further testing if necessary. 2. Hypertension:  Fair control. 3. COPD:  F/u with PCP.  Chronic cough is likely exacerbating CP. 4. Risk Assessment:  His 10 year CV risk is 11%.  According to current guidelines, he would likely benefit from initiation of a moderate to high intensity statin.  I will defer to his PCP. 5. Disposition:  F/u with Dr. Charlton Haws in 4 weeks.    Signed, Tereso Newcomer, PA-C  10/02/2012 10:19 AM

## 2012-10-18 ENCOUNTER — Ambulatory Visit (HOSPITAL_BASED_OUTPATIENT_CLINIC_OR_DEPARTMENT_OTHER): Payer: No Typology Code available for payment source | Attending: Internal Medicine

## 2012-10-18 VITALS — Ht 64.0 in | Wt 176.0 lb

## 2012-10-18 DIAGNOSIS — G4733 Obstructive sleep apnea (adult) (pediatric): Secondary | ICD-10-CM

## 2012-10-18 DIAGNOSIS — G4710 Hypersomnia, unspecified: Secondary | ICD-10-CM | POA: Insufficient documentation

## 2012-10-18 DIAGNOSIS — R400 Somnolence: Secondary | ICD-10-CM

## 2012-10-20 DIAGNOSIS — G4710 Hypersomnia, unspecified: Secondary | ICD-10-CM

## 2012-10-20 NOTE — Procedures (Signed)
NAME:  RYE, DECOSTE NO.:  [id number]  MEDICAL RECORD NO.:  [id number]          PATIENT TYPE:  OUT  LOCATION:  SLEEP CENTER                 FACILITY:  [facility]  PHYSICIAN:  Ryelan Kazee D. Maple Hudson, MD, FCCP, FACPDATE OF BIRTH:  04-23-60  DATE OF STUDY:  10/18/2012                           NOCTURNAL POLYSOMNOGRAM  REFERRING PHYSICIAN:  Ky Barban  REFERRING PHYSICIAN:  Dr. Sara Chu.  INDICATION FOR STUDY:  Hypersomnia with sleep apnea.  EPWORTH SLEEPINESS SCORE:  4/24.  BMI 30.  Weight 176 pounds, height 64 inches, neck 16 inches.  MEDICATIONS:  Home medications are charted for review.  SLEEP ARCHITECTURE:  Total sleep time 258 minutes with sleep efficiency 68.1%.  Stage I was 11%, stage II 66.1%, stage III absent, REM 22.9% of total sleep time.  Sleep latency 32.5 minutes, REM latency 80.5 minutes, awake after sleep onset 88 minutes.  Arousal index 2.1.  Bedtime medication:  None.  RESPIRATORY DATA:  Apnea/hypopnea index (AHI) 4.2 per hour.  A total of 18 events was scored including 1 obstructive apneas, 2 central apneas, 15 hypopneas.  Events were more common while non-supine and while in REM.  REM AHI 10.2 per hour.  There were not enough events to qualify for split protocol CPAP titration.  OXYGEN DATA:  Loud snoring with oxygen desaturation to a nadir of 83% and mean oxygen saturation through the study of 90.6% on room air.  CARDIAC DATA:  Sinus rhythm with PVCs.  MOVEMENT/PARASOMNIA:  A total of 221 limb jerks were counted, of which only 4 were associated with arousal or awakening for periodic limb movement with arousal index of 0.9 per hour.  One bathroom trip.  IMPRESSION/RECOMMENDATION:  Occasional respiratory events with sleep disturbance, within normal limits.  AHI 4.2 per hour (the normal AHI for adults is from 0-5 events per hour).  He had loud snoring with oxygen desaturation to a nadir of 83% and mean oxygen saturation through  the study of 90.6% on room air.    Titilayo Hagans D. Maple Hudson, MD, [facility], FACP Diplomate, American Board of Sleep Medicine   CDY/MEDQ  D:  10/20/2012 11:17:00  T:  10/20/2012 11:33:41  Job:  [id number]

## 2012-10-31 ENCOUNTER — Encounter: Payer: Self-pay | Admitting: Internal Medicine

## 2012-10-31 ENCOUNTER — Ambulatory Visit (AMBULATORY_SURGERY_CENTER): Payer: No Typology Code available for payment source | Admitting: *Deleted

## 2012-10-31 ENCOUNTER — Ambulatory Visit (INDEPENDENT_AMBULATORY_CARE_PROVIDER_SITE_OTHER): Payer: No Typology Code available for payment source | Admitting: Internal Medicine

## 2012-10-31 VITALS — Ht 64.0 in | Wt 174.0 lb

## 2012-10-31 VITALS — BP 112/77 | HR 72 | Temp 97.1°F | Ht 64.0 in | Wt 173.3 lb

## 2012-10-31 DIAGNOSIS — I10 Essential (primary) hypertension: Secondary | ICD-10-CM

## 2012-10-31 DIAGNOSIS — R079 Chest pain, unspecified: Secondary | ICD-10-CM

## 2012-10-31 DIAGNOSIS — M5090 Cervical disc disorder, unspecified, unspecified cervical region: Secondary | ICD-10-CM

## 2012-10-31 DIAGNOSIS — Z1211 Encounter for screening for malignant neoplasm of colon: Secondary | ICD-10-CM

## 2012-10-31 DIAGNOSIS — K219 Gastro-esophageal reflux disease without esophagitis: Secondary | ICD-10-CM

## 2012-10-31 DIAGNOSIS — J449 Chronic obstructive pulmonary disease, unspecified: Secondary | ICD-10-CM

## 2012-10-31 DIAGNOSIS — R400 Somnolence: Secondary | ICD-10-CM

## 2012-10-31 DIAGNOSIS — G4710 Hypersomnia, unspecified: Secondary | ICD-10-CM

## 2012-10-31 MED ORDER — HYDROCODONE-ACETAMINOPHEN 10-325 MG PO TABS: 1.0000 | ORAL_TABLET | Freq: Four times a day (QID) | ORAL | Status: DC | PRN

## 2012-10-31 MED ORDER — ASPIRIN 81 MG PO CHEW: 81.0000 mg | CHEWABLE_TABLET | Freq: Every day | ORAL | Status: DC

## 2012-10-31 MED ORDER — RANITIDINE HCL 150 MG PO TABS: 150.0000 mg | ORAL_TABLET | Freq: Two times a day (BID) | ORAL | Status: DC

## 2012-10-31 MED ORDER — LOVASTATIN 20 MG PO TABS: 20.0000 mg | ORAL_TABLET | Freq: Every day | ORAL | Status: DC

## 2012-10-31 MED ORDER — MOVIPREP 100 G PO SOLR: 1.0000 | Freq: Once | ORAL | Status: DC

## 2012-10-31 MED ORDER — GABAPENTIN 300 MG PO CAPS: 300.0000 mg | ORAL_CAPSULE | Freq: Three times a day (TID) | ORAL | Status: DC

## 2012-10-31 MED ORDER — LISINOPRIL 20 MG PO TABS: 20.0000 mg | ORAL_TABLET | Freq: Every day | ORAL | Status: DC

## 2012-10-31 NOTE — Patient Instructions (Signed)
Thanks for your visit. - I given you a month's supply of Norco. Your refill prescriptions will be held at the front desk. Please come on November 1 and December 1 to pick up these refill prescriptions. - I have given you a copy of your medication contract for you to keep. Remember to keep your medicines locked up, and do not take your family member's medicines, or give your medicines other people. This is for your own safety and the safety of others. - Please continue to do your home physical therapy exercises to help reduce your pain. - I am starting you on a medicine called a statin. This is for high cholesterol and heart disease prevention. - I will look into the cost of getting you a nebulizer machine for your albuterol. - Please return to see me in 3 months.

## 2012-10-31 NOTE — Progress Notes (Signed)
Subjective:    Patient ID: Vernon Carpenter, male    DOB: [birth year], 52 y.o.   MRN: [id number]  HPI Vernon Carpenter is a 52 year old man with PMH significant for asthma, HTN, and cervical disc disease with chronic neck pain who presents for follow up.  HTN - Well-controlled, 112/77 today.  Chronic chest pain - Last visit he was referred to cardiology for possible nuclear stress test. Cards did not recommend proceeding with a nuclear study (he cannot tolerate exercise stress testing 2/2 back pain) and thought his symptoms were most suspicous for chest wall pain. He was given a short course of Naprosyn 500 mg twice a day and encouraged to take his Zantac. He does think his pain improved on this regimen, especially with the Zantac as his relief continued after he stopped the Naprosyn. Cardiology also recommended starting a statin for CVD prevention. His LDL was 121 in 10/13.  Chronic pain - Patient is on a pain contract for Norco 10-325 #120/month for his back and neck pain. However his last 2 UDS were concerning for misuse or misdirection. On 07/19/11 his urine was positive for hydrocodone but negative for its metabolites, suggesting pill dipping. On 08/02/12 his urine was appropriately positive for hydrocodone and its metabolites, but also positive for clonazepam. We discussed the most recent UDS, and he admitted to taking his wife's clonazepam to help him sleep. I explained how this was a violation of his contract and an unsafe practice in general. However, since I was not the person who originally went over the terms of the medication contract with him, I told him I would continue prescribing the Norco as long as he agreed to sign another contract and adhere to its rules. He would not be given any more Norco if he violated again. He agreed and re-signed. We also talked about doing the PT exercises he was taught, at home, especially after he takes his pain medicine and is feeling well.  Asthma - Patient has  occasional, stable shortness of breath. He is a smoker. He cannot afford his albuterol inhaler as he was told it would cost him $100 at [facility]. He has an orange card.    Review of Systems  Constitutional: Negative for fever and chills.  HENT: Negative for congestion and rhinorrhea.   Eyes: Negative for visual disturbance.  Respiratory: Positive for shortness of breath (Stable). Negative for cough.   Gastrointestinal: Negative for nausea, abdominal pain, diarrhea and constipation.  Endocrine: Negative for polydipsia and polyphagia.  Genitourinary: Negative for dysuria, urgency and frequency.  Neurological: Positive for weakness (Stable) and numbness (Stable).  Psychiatric/Behavioral: Negative for agitation.  Denies bowel or bladder incontinence.     Objective:   Physical Exam  Constitutional: He is oriented to person, place, and time. He appears well-developed and well-nourished.  Patient winces in pain as I enter the room and flexes/extends neck.  HENT:  Head: Normocephalic and atraumatic.  Eyes: Conjunctivae and EOM are normal. Pupils are equal, round, and reactive to light.  Neck: Normal range of motion. Neck supple.  Cardiovascular: Normal rate, regular rhythm and normal heart sounds.  Exam reveals no gallop and no friction rub.   No murmur heard. Pulmonary/Chest: Effort normal. He has no wheezes. He has no rales.  Abdominal: Soft. There is no tenderness.  Musculoskeletal: Normal range of motion. He exhibits no edema and no tenderness.  Neurological: He is alert and oriented to person, place, and time. No cranial nerve deficit.  No  focal weakness or other neurologic deficits.  Skin: Skin is warm and dry.  Psychiatric: He has a normal mood and affect.          Assessment & Plan:

## 2012-10-31 NOTE — Assessment & Plan Note (Signed)
Stable. Patient still says albuterol inhaler is too costly. The nebs are available for a couple of dollars at [facility], if we can get him a machine. - Will consult Edson Snowball to see if she has any resources for him

## 2012-10-31 NOTE — Progress Notes (Signed)
No egg or soy allergy. No anesthesia problems.  

## 2012-10-31 NOTE — Assessment & Plan Note (Addendum)
Patient with concern for narcotic misuse or misdirection as above. Medication contract has been resigned and a copy provided to the patient. I was very explicit about how he cannot share his medicines or take another person's medicines. He reassured me that he locks up his Norco at home. - Continue Gabapentin - Continue Norco - Continue physical therapy exercises

## 2012-10-31 NOTE — Assessment & Plan Note (Signed)
Vernon Carpenter had a sleep study on September 18. It was essentially normal: Occasional respiratory events with sleep disturbance, within normal limits. AHI 4.2 per hour (the normal AHI for adults is from 0-5 events per hour). He had loud snoring with oxygen desaturation to a nadir of 83% and mean oxygen saturation through the study of 90.6% on room air.

## 2012-10-31 NOTE — Assessment & Plan Note (Signed)
Cardiology is following, they suspect chest wall pain and have decided not to pursue a nuclear stress test. Patient reports some improvement with Zantac. - Starting Lovastatin 20mg  daily for CVD prevention per cardiology's recommendations (on the [facility] list) - Refilled Zantac

## 2012-10-31 NOTE — Assessment & Plan Note (Signed)
BP Readings from Last 3 Encounters:  10/31/12 112/77  10/02/12 132/90  09/20/12 125/86    Lab Results  Component Value Date   NA 140 09/20/2012   K 4.5 09/20/2012   CREATININE 1.00 09/20/2012    Assessment: Blood pressure control: controlled Progress toward BP goal:  at goal  Plan: Medications:  continue current medications

## 2012-11-02 ENCOUNTER — Telehealth: Payer: Self-pay | Admitting: Licensed Clinical Social Worker

## 2012-11-02 ENCOUNTER — Ambulatory Visit (INDEPENDENT_AMBULATORY_CARE_PROVIDER_SITE_OTHER): Payer: No Typology Code available for payment source | Admitting: *Deleted

## 2012-11-02 ENCOUNTER — Other Ambulatory Visit: Payer: Self-pay | Admitting: Internal Medicine

## 2012-11-02 DIAGNOSIS — Z23 Encounter for immunization: Secondary | ICD-10-CM

## 2012-11-02 DIAGNOSIS — J45909 Unspecified asthma, uncomplicated: Secondary | ICD-10-CM

## 2012-11-02 NOTE — Telephone Encounter (Signed)
[name] was referred to CSW for assistance programs available for uninsured with need for nebulizer.  CSW spoke liaison for [facility], program available will need to submit information to [facility] for agency to follow up with pt regarding eligibility.  CSW placed called to pt.  CSW left message requesting return call. CSW provided contact hours and phone number.

## 2012-11-02 NOTE — Telephone Encounter (Signed)
Pt returned call to CSW.  Pt in agreement for CSW to fax information to [facility] for determination of eligibility for nebulizer machine.  Mr. Godman notified he will receive call for [facility] inquiring about financial information.  Referral faxed.

## 2012-11-05 NOTE — Progress Notes (Signed)
I saw and evaluated the patient.  I personally confirmed the key portions of Dr. Cater's history and exam and reviewed pertinent patient test results.  The assessment, diagnosis, and plan were formulated together and I agree with the documentation in the resident's note. 

## 2012-11-07 ENCOUNTER — Encounter: Payer: Self-pay | Admitting: Cardiovascular Disease

## 2012-11-07 ENCOUNTER — Ambulatory Visit (INDEPENDENT_AMBULATORY_CARE_PROVIDER_SITE_OTHER): Payer: No Typology Code available for payment source | Admitting: Cardiovascular Disease

## 2012-11-07 ENCOUNTER — Telehealth: Payer: Self-pay | Admitting: *Deleted

## 2012-11-07 VITALS — BP 115/72 | HR 90 | Ht 64.0 in | Wt 171.0 lb

## 2012-11-07 DIAGNOSIS — R079 Chest pain, unspecified: Secondary | ICD-10-CM

## 2012-11-07 DIAGNOSIS — I10 Essential (primary) hypertension: Secondary | ICD-10-CM

## 2012-11-07 DIAGNOSIS — F17200 Nicotine dependence, unspecified, uncomplicated: Secondary | ICD-10-CM

## 2012-11-07 DIAGNOSIS — Z720 Tobacco use: Secondary | ICD-10-CM

## 2012-11-07 MED ORDER — ALBUTEROL SULFATE (2.5 MG/3ML) 0.083% IN NEBU: 2.5000 mg | INHALATION_SOLUTION | Freq: Four times a day (QID) | RESPIRATORY_TRACT | Status: DC | PRN

## 2012-11-07 NOTE — Patient Instructions (Signed)
Your physician recommends that you schedule a follow-up appointment in: AS NEEDED  Your physician recommends that you continue on your current medications as directed. Please refer to the Current Medication list given to you today. Your physician has requested that you have en exercise stress myoview. For further information please visit [email or web address]. Please follow instruction sheet, as given.  

## 2012-11-07 NOTE — Assessment & Plan Note (Signed)
Well controlled.  Continue current medications and low sodium Dash type diet.    

## 2012-11-07 NOTE — Assessment & Plan Note (Signed)
Persistant  F/U lexiscan myovue Cannot walk on treadmill due to back pain

## 2012-11-07 NOTE — Progress Notes (Signed)
Patient ID: Vernon Carpenter, male   DOB: 1960/03/13, 52 y.o.   MRN: [id number] 52 yo referred by [facility] for SSCP. Seen 1/5 /12 and R/O with negative enzymes, NAD on CXR and ECG with no acute changes. Reviewed visit. Randomize in Promise trial to CT  Calcium score 0 and no significant CAD. Distal circ and RCA not well visualized Smokes 1ppd for years. Discussed smoking cessation for less than 10 minutes and relationship between it and vascular disease. CXR 11/03/11 reviewed and NAD Needs umbilical hernia repair He is confused about whats going on. Appears that [facility] arranged PFT;s and cardiac clearence. He is clear to have surgery from cardiac stand point. Needs PFT;s done and F/U with [facility] with surgical referral.  Poor medical F/U with no primary. Currently not working and sedentary.  Labs 10/13 reviewed LDL 121    He was seen in the ED in 07/2012 with CP. CEs were neg. CXR was neg for anything acute. Of note, he has evidence of old healed rib fractures on the left. He was seen in follow up by his PCP and referred here for further evaluation. He notes L sided CP for ~ 2 years. States it is tight "like a vice." He notes it gets worse at times and may last 1-2 minutes. No real exacerbating factors. Denies significant worsening with exertion. He denies any radiating symptoms. No associated nausea. He does note diaphoresis. He does note DOE. He is limited by his back pain. No orthopnea, PND, edema. Seen by PA some relief with zantac and NSAI's but still having some exertional and positional pains  ROS: Denies fever, malais, weight loss, blurry vision, decreased visual acuity, cough, sputum, SOB, hemoptysis, pleuritic pain, palpitaitons, heartburn, abdominal pain, melena, lower extremity edema, claudication, or rash.  All other systems reviewed and negative  General: Affect appropriate Healthy:  appears stated age HEENT: normal Neck supple with no adenopathy JVP normal no bruits no  thyromegaly Lungs clear with no wheezing and good diaphragmatic motion Heart:  S1/S2 no murmur, no rub, gallop or click PMI normal Abdomen: benighn, BS positve, no tenderness, no AAA no bruit.  No HSM or HJR Distal pulses intact with no bruits No edema Neuro non-focal Skin warm and dry No muscular weakness   Current Outpatient Prescriptions  Medication Sig Dispense Refill  . aspirin 81 MG chewable tablet Chew 1 tablet (81 mg total) by mouth daily.  30 tablet  3  . gabapentin (NEURONTIN) 300 MG capsule Take 1 capsule (300 mg total) by mouth 3 (three) times daily.  90 capsule  2  . HYDROcodone-acetaminophen (NORCO) 10-325 MG per tablet Take 1 tablet by mouth every 6 (six) hours as needed for pain.  120 tablet  0  . lisinopril (PRINIVIL,ZESTRIL) 20 MG tablet Take 1 tablet (20 mg total) by mouth daily.  30 tablet  11  . lovastatin (MEVACOR) 20 MG tablet Take 1 tablet (20 mg total) by mouth daily.  30 tablet  11  . NON FORMULARY PT USES an nebulizer      . ranitidine (ZANTAC) 150 MG tablet Take 1 tablet (150 mg total) by mouth 2 (two) times daily.  60 tablet  1   No current facility-administered medications for this visit.    Allergies  Review of patient's allergies indicates no known allergies.  Electrocardiogram:  10/03/66  SR normal   Assessment and Plan

## 2012-11-07 NOTE — Telephone Encounter (Signed)
Order for nebulizer solution placed. It will only cost him $4/month at [facility].  Vivi Barrack, MD  [email or web address] Pager # [phone] Office # [phone]

## 2012-11-07 NOTE — Telephone Encounter (Signed)
Pt request meds for his nebulizer.  He has the machine at home, arrived yesterday. Pt # [id number]

## 2012-11-07 NOTE — Assessment & Plan Note (Signed)
Counseled for less than 10 minutes. Some motivation. Nicotine replacement is expensive but he will try . Down to 1/2 ppd

## 2012-11-08 ENCOUNTER — Telehealth: Payer: Self-pay | Admitting: Internal Medicine

## 2012-11-08 NOTE — Telephone Encounter (Signed)
  Reason for call:   I placed an outgoing call to [name] at 10:20  AM regarding his albuterol nebulizer mediation.   Assessment/ Plan:   Informed patient that I had placed an order for his albuterol nebulizer solution.  The medicine is available at [facility] for only $4.  Patient was instructed to use the machine when he gets short of breath, but not more than every 4-6 hours.  If he feels like he needs to use it more often than every 4-6 hours, he should call the clinic or go to the emergency room, because he may be having an asthma attack.  As always, pt is advised that if symptoms worsen or new symptoms arise, they should go to an urgent care facility or to to ER for further evaluation.   Vivi Barrack, MD   11/08/2012, 10:20 AM

## 2012-11-14 ENCOUNTER — Ambulatory Visit (AMBULATORY_SURGERY_CENTER): Payer: No Typology Code available for payment source | Admitting: Internal Medicine

## 2012-11-14 ENCOUNTER — Encounter: Payer: Self-pay | Admitting: Internal Medicine

## 2012-11-14 VITALS — BP 116/70 | HR 65 | Temp 96.6°F | Resp 17 | Ht 64.0 in | Wt 174.0 lb

## 2012-11-14 DIAGNOSIS — D126 Benign neoplasm of colon, unspecified: Secondary | ICD-10-CM

## 2012-11-14 DIAGNOSIS — Z1211 Encounter for screening for malignant neoplasm of colon: Secondary | ICD-10-CM

## 2012-11-14 MED ORDER — SODIUM CHLORIDE 0.9 % IV SOLN: 500.0000 mL | INTRAVENOUS | Status: DC

## 2012-11-14 NOTE — Progress Notes (Signed)
Procedure ends, to recovery, report given and VSS. 

## 2012-11-14 NOTE — Progress Notes (Addendum)
Patient stating he ate a cheeseburger and small fries on 11/13/12 at 1330. Patient stating no further solids after that time. Patient stating he did complete the pm and am dose of Moviprep. Patient noncommittal with condition of last bowel movement, stating some liquids.  Dr. Rhea Belton informed of above by Haynes Bast RN.  Fleets enema ordered. Instructed the patient on the MD recommendations and importance of good prep. Patient stating unable to understand instruction for self administration of fleets enema. Fleet enema administered by this Clinical research associate.

## 2012-11-14 NOTE — Op Note (Signed)
[facility] [address] [place], [address]   COLONOSCOPY PROCEDURE REPORT  PATIENT: Vernon Carpenter, Vernon Carpenter  MR#: [id number] BIRTHDATE: [birth year] , 51  yrs. old GENDER: Male ENDOSCOPIST: Beverley Fiedler, MD REFERRED BY: Vivi Barrack, MD PROCEDURE DATE:  11/14/2012 PROCEDURE:   Colonoscopy with snare polypectomy First Screening Colonoscopy - Avg.  risk and is 50 yrs.  old or older Yes.  Prior Negative Screening - Now for repeat screening. N/A  History of Adenoma - Now for follow-up colonoscopy & has been > or = to 3 yrs.  N/A  Polyps Removed Today? Yes. ASA CLASS:   Class III INDICATIONS:average risk screening and first colonoscopy. MEDICATIONS: MAC sedation, administered by CRNA and propofol (Diprivan) 200mg  IV  DESCRIPTION OF PROCEDURE:   After the risks benefits and alternatives of the procedure were thoroughly explained, informed consent was obtained.  A digital rectal exam revealed no rectal mass.   The [id number]  endoscope was introduced through the anus and advanced to the cecum, which was identified by both the appendix and ileocecal valve. No adverse events experienced. The quality of the prep was good, using MoviPrep  The instrument was then slowly withdrawn as the colon was fully examined.   COLON FINDINGS: Three sessile polyps ranging between 3-70mm in size were found in the rectosigmoid colon.  Polypectomy was performed using cold snare.  All resections were complete and all polyp tissue was completely retrieved.   Moderate diverticulosis was noted throughout the entire examined colon.  Retroflexed views revealed no abnormalities. The time to cecum=2 minutes 26 seconds. Withdrawal time=11 minutes 45 seconds.  The scope was withdrawn and the procedure completed. COMPLICATIONS: There were no complications.  ENDOSCOPIC IMPRESSION: 1.   Three sessile polyps ranging between 3-27mm in size were found in the rectosigmoid colon; Polypectomy was  performed using cold snare 2.   Moderate diverticulosis was noted throughout the entire examined colon  RECOMMENDATIONS: 1.  Await pathology results 2.  High fiber diet 3.  Timing of repeat colonoscopy will be determined by pathology findings. 4.  You will receive a letter within 1-2 weeks with the results of your biopsy as well as final recommendations.  Please call my office if you have not received a letter after 3 weeks.   eSigned:  Beverley Fiedler, MD 11/14/2012 11:39 AM cc: The Patient; Vivi Barrack, MD

## 2012-11-14 NOTE — Progress Notes (Signed)
Called to room to assist during endoscopic procedure.  Patient ID and intended procedure confirmed with present staff. Received instructions for my participation in the procedure from the performing physician. ewm 

## 2012-11-14 NOTE — Progress Notes (Signed)
No complaints noted in the recovery room. Maw   

## 2012-11-14 NOTE — Patient Instructions (Signed)
YOU HAD AN ENDOSCOPIC PROCEDURE TODAY AT THE [facility]: Refer to the procedure report that was given to you for any specific questions about what was found during the examination.  If the procedure report does not answer your questions, please call your gastroenterologist to clarify.  If you requested that your care partner not be given the details of your procedure findings, then the procedure report has been included in a sealed envelope for you to review at your convenience later.  YOU SHOULD EXPECT: Some feelings of bloating in the abdomen. Passage of more gas than usual.  Walking can help get rid of the air that was put into your GI tract during the procedure and reduce the bloating. If you had a lower endoscopy (such as a colonoscopy or flexible sigmoidoscopy) you may notice spotting of blood in your stool or on the toilet paper. If you underwent a bowel prep for your procedure, then you may not have a normal bowel movement for a few days.  DIET: Your first meal following the procedure should be a light meal and then it is ok to progress to your normal diet.  A half-sandwich or bowl of soup is an example of a good first meal.  Heavy or fried foods are harder to digest and may make you feel nauseous or bloated.  Likewise meals heavy in dairy and vegetables can cause extra gas to form and this can also increase the bloating.  Drink plenty of fluids but you should avoid alcoholic beverages for 24 hours.  ACTIVITY: Your care partner should take you home directly after the procedure.  You should plan to take it easy, moving slowly for the rest of the day.  You can resume normal activity the day after the procedure however you should NOT DRIVE or use heavy machinery for 24 hours (because of the sedation medicines used during the test).    SYMPTOMS TO REPORT IMMEDIATELY: A gastroenterologist can be reached at any hour.  During normal business hours, 8:30 AM to 5:00 PM Monday through Friday,  call [phone].  After hours and on weekends, please call the GI answering service at [phone] who will take a message and have the physician on call contact you.   Following lower endoscopy (colonoscopy or flexible sigmoidoscopy):  Excessive amounts of blood in the stool  Significant tenderness or worsening of abdominal pains  Swelling of the abdomen that is new, acute  Fever of 100F or higher   FOLLOW UP: If any biopsies were taken you will be contacted by phone or by letter within the next 1-3 weeks.  Call your gastroenterologist if you have not heard about the biopsies in 3 weeks.  Our staff will call the home number listed on your records the next business day following your procedure to check on you and address any questions or concerns that you may have at that time regarding the information given to you following your procedure. This is a courtesy call and so if there is no answer at the home number and we have not heard from you through the emergency physician on call, we will assume that you have returned to your regular daily activities without incident.  SIGNATURES/CONFIDENTIALITY: You and/or your care partner have signed paperwork which will be entered into your electronic medical record.  These signatures attest to the fact that that the information above on your After Visit Summary has been reviewed and is understood.  Full responsibility of the confidentiality of   this discharge information lies with you and/or your care-partner.   Handouts were given to your care partner on polyps, diverticulosis and a high fiber diet. You may resume your current medications today. Await pathology results. Please call if any questions or concerns.

## 2012-11-15 ENCOUNTER — Telehealth: Payer: Self-pay | Admitting: *Deleted

## 2012-11-15 NOTE — Telephone Encounter (Signed)
  Follow up Call-  Call back number 11/14/2012  Post procedure Call Back phone  # [phone]  Permission to leave phone message Yes     Patient questions:  Do you have a fever, pain , or abdominal swelling? no Pain Score  0 *  Have you tolerated food without any problems? yes  Have you been able to return to your normal activities? yes  Do you have any questions about your discharge instructions: Diet   no Medications  no Follow up visit  no  Do you have questions or concerns about your Care? no  Actions: * If pain score is 4 or above: No action needed, pain <4.

## 2012-11-19 ENCOUNTER — Encounter: Payer: Self-pay | Admitting: Internal Medicine

## 2012-11-26 ENCOUNTER — Ambulatory Visit (HOSPITAL_COMMUNITY): Payer: No Typology Code available for payment source | Attending: Cardiovascular Disease | Admitting: Radiology

## 2012-11-26 VITALS — BP 105/67 | HR 64 | Ht 64.0 in | Wt 171.0 lb

## 2012-11-26 DIAGNOSIS — I10 Essential (primary) hypertension: Secondary | ICD-10-CM | POA: Insufficient documentation

## 2012-11-26 DIAGNOSIS — R42 Dizziness and giddiness: Secondary | ICD-10-CM | POA: Insufficient documentation

## 2012-11-26 DIAGNOSIS — R0602 Shortness of breath: Secondary | ICD-10-CM | POA: Insufficient documentation

## 2012-11-26 DIAGNOSIS — R61 Generalized hyperhidrosis: Secondary | ICD-10-CM | POA: Insufficient documentation

## 2012-11-26 DIAGNOSIS — R079 Chest pain, unspecified: Secondary | ICD-10-CM | POA: Insufficient documentation

## 2012-11-26 DIAGNOSIS — F17200 Nicotine dependence, unspecified, uncomplicated: Secondary | ICD-10-CM | POA: Insufficient documentation

## 2012-11-26 DIAGNOSIS — R0609 Other forms of dyspnea: Secondary | ICD-10-CM | POA: Insufficient documentation

## 2012-11-26 DIAGNOSIS — R0989 Other specified symptoms and signs involving the circulatory and respiratory systems: Secondary | ICD-10-CM | POA: Insufficient documentation

## 2012-11-26 MED ORDER — TECHNETIUM TC 99M SESTAMIBI GENERIC - CARDIOLITE
10.0000 | Freq: Once | INTRAVENOUS | Status: AC | PRN
  Administered 2012-11-26: 10 via INTRAVENOUS

## 2012-11-26 MED ORDER — TECHNETIUM TC 99M SESTAMIBI GENERIC - CARDIOLITE
30.0000 | Freq: Once | INTRAVENOUS | Status: AC | PRN
  Administered 2012-11-26: 30 via INTRAVENOUS

## 2012-11-26 MED ORDER — REGADENOSON 0.4 MG/5ML IV SOLN
0.4000 mg | Freq: Once | INTRAVENOUS | Status: AC
  Administered 2012-11-26: 0.4 mg via INTRAVENOUS

## 2012-11-26 NOTE — Progress Notes (Signed)
[facility] SITE 3 NUCLEAR MED [address] [place], [place] [address] [phone]    Cardiology Nuclear Med Study  Vernon Carpenter is a 52 y.o. male     MRN : [id number]     DOB: [birth year]  Procedure Date: 11/26/2012  Nuclear Med Background Indication for Stress Test:  Evaluation for Ischemia History:  No previous documented CAD.  ED 7/14 with chest pain and negative enzymes. Cardiac Risk Factors: Hypertension, Lipids and Smoker  Symptoms:  Chest Pain with and without Exertion (last episode of chest discomfort was about a week ago), Diaphoresis, Dizziness, DOE/SOB and Rapid HR    Nuclear Pre-Procedure Caffeine/Decaff Intake:  None > 12 hrs NPO After: 9:00pm   Lungs:  Minimal expiratory wheezes.  Albuterol inhaler used prior to [employer].  Lungs clear after use of inhaler. O2 Sat: 95% on room air. IV 0.9% NS with Angio Cath:  22g  IV Site: R Antecubital x 1, tolerated well IV Started by:  Irean Hong, RN  Chest Size (in):  42 Cup Size: n/a  Height: 5\' 4"  (1.626 m)  Weight:  171 lb (77.565 kg)  BMI:  Body mass index is 29.34 kg/(m^2). Tech Comments:  Morning medications taken.    Nuclear Med Study 1 or 2 day study: 1 day  Stress Test Type:  Eugenie Birks  Reading MD: Kristeen Miss, MD  Order Authorizing Provider:  Charlton Haws, MD  Resting Radionuclide: Technetium 65m Sestamibi  Resting Radionuclide Dose: 11.0 mCi   Stress Radionuclide:  Technetium 31m Sestamibi  Stress Radionuclide Dose: 33.0 mCi           Stress Protocol Rest HR: 64 Stress HR: 99  Rest BP: 105/67 Stress BP: 105/73  Exercise Time (min): n/a METS: n/a   Predicted Max HR: 169 bpm % Max HR: 58.58 bpm Rate Pressure Product: 84132   Dose of Adenosine (mg):  n/a Dose of Lexiscan: 0.4 mg  Dose of Atropine (mg): n/a Dose of Dobutamine: n/a mcg/kg/min (at max HR)  Stress Test Technologist: Smiley Houseman, CMA-N  Nuclear Technologist:  Doyne Keel, CNMT     Rest Procedure:  Myocardial perfusion  imaging was performed at rest 45 minutes following the intravenous administration of Technetium 4m Sestamibi.  Rest ECG: NSR - Normal EKG  Stress Procedure:  The patient received IV Lexiscan 0.4 mg over 15-seconds.  Technetium 51m Sestamibi injected at 30-seconds.  He c/o being lightheaded, started coughing and became hoarse with Lexiscan.  His lungs were still CTA.  Quantitative spect images were obtained after a 45 minute delay.  Stress ECG: No significant change from baseline ECG  QPS Raw Data Images:  Normal; slight motion artifact; normal heart/lung ratio.  There is significant uptake in the structures below the diaphragm  Stress Images:  There is a large area of mild inhomogenous uptake in the entire inferior wall with normal uptake in the other regions.   Rest Images:  There is a large area of mild inhomogenous uptake in the entire inferior wall with normal uptake in the other regions. Subtraction (SDS):  No evidence of ischemia. Transient Ischemic Dilatation (Normal <1.22):  0.97 Lung/Heart Ratio (Normal <0.45):  0.29  Quantitative Gated Spect Images QGS EDV:  93 ml QGS ESV:  33 ml  Impression Exercise Capacity:  Lexiscan with no exercise. BP Response:  Normal blood pressure response. Clinical Symptoms:  No significant symptoms noted. ECG Impression:  No significant ST segment change suggestive of ischemia. Comparison with Prior Nuclear Study: No previous  nuclear study performed  Overall Impression:  Low risk stress nuclear study .  There is very mild, inhomogeneous attenuation of the entire inferior wall that appears to be due to artifcact.  The inferior wall contracts normally.   The LV function is normal. .  no  LV Ejection Fraction: 64%.  LV Wall Motion:  NL LV Function; NL Wall Motion.   Vesta Mixer, Montez Hageman., MD, [facility] 11/26/2012, 5:41 PM Office - [phone] Pager [phone]

## 2012-11-30 ENCOUNTER — Encounter: Payer: Self-pay | Admitting: *Deleted

## 2012-12-05 ENCOUNTER — Ambulatory Visit: Payer: No Typology Code available for payment source

## 2012-12-07 ENCOUNTER — Telehealth: Payer: Self-pay | Admitting: *Deleted

## 2012-12-07 NOTE — Telephone Encounter (Signed)
ERROR./CY 

## 2013-01-09 ENCOUNTER — Encounter: Payer: Self-pay | Admitting: Internal Medicine

## 2013-01-09 ENCOUNTER — Ambulatory Visit (INDEPENDENT_AMBULATORY_CARE_PROVIDER_SITE_OTHER): Payer: No Typology Code available for payment source | Admitting: Internal Medicine

## 2013-01-09 VITALS — BP 101/67 | HR 102 | Temp 96.8°F | Ht 64.0 in | Wt 168.5 lb

## 2013-01-09 DIAGNOSIS — J4489 Other specified chronic obstructive pulmonary disease: Secondary | ICD-10-CM

## 2013-01-09 DIAGNOSIS — M5090 Cervical disc disorder, unspecified, unspecified cervical region: Secondary | ICD-10-CM

## 2013-01-09 DIAGNOSIS — K219 Gastro-esophageal reflux disease without esophagitis: Secondary | ICD-10-CM

## 2013-01-09 DIAGNOSIS — I10 Essential (primary) hypertension: Secondary | ICD-10-CM

## 2013-01-09 DIAGNOSIS — Z0000 Encounter for general adult medical examination without abnormal findings: Secondary | ICD-10-CM

## 2013-01-09 DIAGNOSIS — M542 Cervicalgia: Secondary | ICD-10-CM

## 2013-01-09 DIAGNOSIS — J449 Chronic obstructive pulmonary disease, unspecified: Secondary | ICD-10-CM

## 2013-01-09 DIAGNOSIS — T8130XD Disruption of wound, unspecified, subsequent encounter: Secondary | ICD-10-CM

## 2013-01-09 DIAGNOSIS — Z5189 Encounter for other specified aftercare: Secondary | ICD-10-CM

## 2013-01-09 DIAGNOSIS — T8131XD Disruption of external operation (surgical) wound, not elsewhere classified, subsequent encounter: Secondary | ICD-10-CM

## 2013-01-09 DIAGNOSIS — R079 Chest pain, unspecified: Secondary | ICD-10-CM

## 2013-01-09 MED ORDER — HYDROCODONE-ACETAMINOPHEN 10-325 MG PO TABS: 1.0000 | ORAL_TABLET | Freq: Four times a day (QID) | ORAL | Status: DC | PRN

## 2013-01-09 MED ORDER — RANITIDINE HCL 150 MG PO TABS: 150.0000 mg | ORAL_TABLET | Freq: Two times a day (BID) | ORAL | Status: DC

## 2013-01-09 NOTE — Assessment & Plan Note (Signed)
BP Readings from Last 3 Encounters:  01/09/13 101/67  11/26/12 105/67  11/14/12 116/70    Lab Results  Component Value Date   NA 140 09/20/2012   K 4.5 09/20/2012   CREATININE 1.00 09/20/2012    Assessment: Blood pressure control: controlled Progress toward BP goal:  at goal  Plan: Medications:  continue current medications

## 2013-01-09 NOTE — Assessment & Plan Note (Addendum)
Colonoscopy in October 2014 that was significant for 3 sessile polyps, pathology negative for hyperplasia or malignancy, moderate diverticulosis. Will repeat in 10 years. Up to date on other health maintenance.

## 2013-01-09 NOTE — Assessment & Plan Note (Signed)
Stable. Patient now with access to nebulizer and low-cost Albuterol nebs.

## 2013-01-09 NOTE — Assessment & Plan Note (Signed)
This should be trimmed by his surgeon. No signs or symptoms of infection/cellulitis, but I can see that it is irritating him. - Patient given the contact information for Gaynelle Adu MD, [facility], [phone] - He will schedule appointment himself for trimming

## 2013-01-09 NOTE — Assessment & Plan Note (Addendum)
Refilled Norco 10-325 #120/month for his back and neck pain.  - Paper scripts written for fill dates of: 01/31/13, 03/03/13, 03/31/13, 05/01/13, 05/31/13, 07/01/13.  - Personally handed these to patient, advised him to give them to his pharmacist, and told him we could not replace them if they were lost. - Reassessment in 6 months.

## 2013-01-09 NOTE — Assessment & Plan Note (Signed)
Dr. Eden Emms with Cardiology has been following him. A nuclear stress test was performed on 10/27, low risk. Symptoms are most suspicous for chest wall pain vs. GERD. - Continue Zantac - Continue pain control with Norco - Daily ASA

## 2013-01-09 NOTE — Patient Instructions (Addendum)
Thanks for your visit. - Please call your surgeon, Gaynelle Adu MD, at [phone] and schedule an appointment to have your extruding suture removed. - I have given you 6 months worth of refills for your Norco. Please give these to your pharmacist to hold. If you lose them, I cannot give you a replacement. - Please return to see me in 6 months for blood pressure check - Please talk to your lawyer about getting a disability exam. We cannot do these in our clinic.

## 2013-01-09 NOTE — Progress Notes (Signed)
Subjective:    Patient ID: Vernon Carpenter, male    DOB: 07/01/1960, 52 y.o.   MRN: [id number]  HPI Vernon Carpenter is a 52 year old man with PMH significant for asthma, HTN, and cervical disc disease with chronic neck pain who presents for follow up.   Health maintenance - He had a colonoscopy in October that was significant for 3 sessile hyperplastic polyps, pathology negative for dysplasia or malignancy, moderate diverticulosis. Will repeat in 10 years. Up to date on other health maintenance.  HTN - Well-controlled, 101/67 today.   Chronic chest pain - Dr. Eden Emms with Cardiology has been following him. A nuclear stress test was performed on 10/27, low risk.  Chronic pain - Patient is on a pain contract for Norco 10-325 #120/month for his back and neck pain. He needs refills after December.  Asthma - Patient still has occasional, stable shortness of breath. He is a smoker. Last visit we were able to get him a nebulizer machine, so that he could purchase albuterol nebs through the $4 program at [place].  Extruding suture - Patient has a known extruding suture above his umbilicus s/p hernia repair on 01/10/12. Gaynelle Adu MD was the surgeon. It is starting to bother him more, causing itching and sometimes sharp pain.  Disability application - Patient is applying for social security disability. Trial will be 02/07/13. He brings paperwork for an extensive functional exam. Patient was advised to ask his lawyer about this as we do not do these assessments in the [facility].   Current Outpatient Prescriptions on File Prior to Visit  Medication Sig Dispense Refill  . albuterol (PROVENTIL) (2.5 MG/3ML) 0.083% nebulizer solution Take 3 mLs (2.5 mg total) by nebulization every 6 (six) hours as needed for wheezing.  75 mL  12  . aspirin 81 MG chewable tablet Chew 1 tablet (81 mg total) by mouth daily.  30 tablet  3  . gabapentin (NEURONTIN) 300 MG capsule Take 1 capsule (300 mg total) by mouth 3 (three)  times daily.  90 capsule  2  . HYDROcodone-acetaminophen (NORCO) 10-325 MG per tablet Take 1 tablet by mouth every 6 (six) hours as needed for pain.  120 tablet  0  . lisinopril (PRINIVIL,ZESTRIL) 20 MG tablet Take 1 tablet (20 mg total) by mouth daily.  30 tablet  11  . lovastatin (MEVACOR) 20 MG tablet Take 1 tablet (20 mg total) by mouth daily.  30 tablet  11  . NON FORMULARY PT USES an nebulizer      . ranitidine (ZANTAC) 150 MG tablet Take 1 tablet (150 mg total) by mouth 2 (two) times daily.  60 tablet  1    Review of Systems  Constitutional: Negative for fever and chills.  HENT: Negative for congestion and rhinorrhea.  Eyes: Negative for visual disturbance.  Respiratory: Positive for shortness of breath (Stable). Negative for cough.  Gastrointestinal: Negative for nausea, abdominal pain, diarrhea and constipation.  Endocrine: Negative for polydipsia and polyphagia.  Genitourinary: Negative for dysuria, urgency and frequency.  Neurological: Positive for weakness (Stable) and numbness (Stable). Occasional myoclonic jerks of right arm, lasting <1 sec, occur when resting.  Psychiatric/Behavioral: Negative for agitation.  Denies bowel or bladder incontinence.     Objective:   Physical Exam  Constitutional: He is oriented to person, place, and time. He appears well-developed and well-nourished.  Patient winces in pain as I enter the room and flexes/extends neck.  HENT:  Head: Normocephalic and atraumatic.  Eyes: Conjunctivae and  EOM are normal. Pupils are equal, round, and reactive to light.  Neck: Normal range of motion. Neck supple.  Cardiovascular: Normal rate, regular rhythm and normal heart sounds. Exam reveals no gallop and no friction rub.  No murmur heard.  Pulmonary/Chest: Effort normal. He has no wheezes. He has no rales.  Abdominal: Soft. There is no tenderness.  The end of an fascial suture is poking underneath the skin above his umbilicus, near the incision site from  his hernia repair. Area appears irritated but not erythematous. No pus, no swelling. No skin breakdown. Musculoskeletal: Normal range of motion. He exhibits no edema and no tenderness.  Neurological: He is alert and oriented to person, place, and time. No cranial nerve deficit.  No focal weakness or other neurologic deficits.  Skin: Skin is warm and dry.  Psychiatric: He has a normal mood and affect.      Assessment & Plan:

## 2013-01-10 NOTE — Progress Notes (Signed)
I saw and evaluated the patient.  I personally confirmed the key portions of Dr. Cater's history and exam and reviewed pertinent patient test results.  The assessment, diagnosis, and plan were formulated together and I agree with the documentation in the resident's note. 

## 2013-02-14 ENCOUNTER — Encounter (INDEPENDENT_AMBULATORY_CARE_PROVIDER_SITE_OTHER): Payer: Self-pay

## 2013-02-14 ENCOUNTER — Encounter (INDEPENDENT_AMBULATORY_CARE_PROVIDER_SITE_OTHER): Payer: Self-pay | Admitting: General Surgery

## 2013-02-18 ENCOUNTER — Emergency Department (HOSPITAL_COMMUNITY)
Admission: EM | Admit: 2013-02-18 | Discharge: 2013-02-19 | Disposition: A | Discharge status: Home / Self Care | Payer: No Typology Code available for payment source | Attending: Emergency Medicine | Admitting: Emergency Medicine

## 2013-02-18 ENCOUNTER — Encounter (HOSPITAL_COMMUNITY): Payer: Self-pay | Admitting: Emergency Medicine

## 2013-02-18 DIAGNOSIS — M129 Arthropathy, unspecified: Secondary | ICD-10-CM | POA: Insufficient documentation

## 2013-02-18 DIAGNOSIS — J9620 Acute and chronic respiratory failure, unspecified whether with hypoxia or hypercapnia: Secondary | ICD-10-CM | POA: Insufficient documentation

## 2013-02-18 DIAGNOSIS — K219 Gastro-esophageal reflux disease without esophagitis: Secondary | ICD-10-CM | POA: Insufficient documentation

## 2013-02-18 DIAGNOSIS — K929 Disease of digestive system, unspecified: Secondary | ICD-10-CM | POA: Insufficient documentation

## 2013-02-18 DIAGNOSIS — T819XXA Unspecified complication of procedure, initial encounter: Secondary | ICD-10-CM

## 2013-02-18 DIAGNOSIS — Z7982 Long term (current) use of aspirin: Secondary | ICD-10-CM | POA: Insufficient documentation

## 2013-02-18 DIAGNOSIS — Z8659 Personal history of other mental and behavioral disorders: Secondary | ICD-10-CM | POA: Insufficient documentation

## 2013-02-18 DIAGNOSIS — R109 Unspecified abdominal pain: Secondary | ICD-10-CM | POA: Insufficient documentation

## 2013-02-18 DIAGNOSIS — I10 Essential (primary) hypertension: Secondary | ICD-10-CM | POA: Insufficient documentation

## 2013-02-18 DIAGNOSIS — F17200 Nicotine dependence, unspecified, uncomplicated: Secondary | ICD-10-CM | POA: Insufficient documentation

## 2013-02-18 DIAGNOSIS — J449 Chronic obstructive pulmonary disease, unspecified: Secondary | ICD-10-CM | POA: Insufficient documentation

## 2013-02-18 DIAGNOSIS — Y838 Other surgical procedures as the cause of abnormal reaction of the patient, or of later complication, without mention of misadventure at the time of the procedure: Secondary | ICD-10-CM | POA: Insufficient documentation

## 2013-02-18 DIAGNOSIS — J4489 Other specified chronic obstructive pulmonary disease: Secondary | ICD-10-CM | POA: Insufficient documentation

## 2013-02-18 DIAGNOSIS — Z79899 Other long term (current) drug therapy: Secondary | ICD-10-CM | POA: Insufficient documentation

## 2013-02-18 NOTE — ED Notes (Signed)
Pt with hx of umbilical hernia repair in 12/13.  Pt feels like a suture is poking out of the umbilicus.  No s/s of infection.

## 2013-02-18 NOTE — ED Provider Notes (Signed)
CSN: [id number]     Arrival date & time 02/18/13  1902 History   First MD Initiated Contact with Patient 02/18/13 2338     Chief Complaint  Patient presents with  . Abdominal Pain    post-surgical   (Consider location/radiation/quality/duration/timing/severity/associated sxs/prior Treatment) HPI Comments: 53 yo male with remote umbilical hernia repair with chief complaint that something is protruding from his skin at the site of the repair.  This has been present for a long time, but he was urged to come to the ED today by a family member, concerned about possible infection.    Patient is a 53 y.o. male presenting with abdominal pain. The history is provided by the patient.  Abdominal Pain Pain location:  Periumbilical Pain quality: sharp   Pain radiates to:  Does not radiate Pain severity:  Mild Onset quality:  Gradual Duration: months. Timing:  Intermittent Progression:  Unchanged Chronicity:  Chronic Context comment:  Umbilical hernia repair 19/4174 Relieved by:  Nothing Exacerbated by: touching area. Ineffective treatments:  None tried Associated symptoms: no chest pain, no cough, no diarrhea, no fever, no nausea, no shortness of breath and no vomiting     Past Medical History  Diagnosis Date  . Acute and chronic respiratory failure   . BACK PAIN   . CHEST PAIN     12/2011 seen by Dr. Johnsie Cancel and cleared for hernia repair surgery  . Plantar fasciitis of left foot   . Neck pain   . Cough   . Umbilical hernia   . COPD (chronic obstructive pulmonary disease)   . Asthma   . Arthritis   . Hypertension   . Depression   . GERD (gastroesophageal reflux disease)    Past Surgical History  Procedure Laterality Date  . S/p cervical discectomy      C6-C7  . Umbilical hernia repair  01/10/2012    Procedure: HERNIA REPAIR UMBILICAL ADULT;  Surgeon: Gayland Curry, MD,FACS;  Location: [place];  Service: General;  Laterality: N/A;  . Insertion of mesh  01/10/2012    Procedure:  INSERTION OF MESH;  Surgeon: Gayland Curry, MD,FACS;  Location: [place];  Service: General;  Laterality: N/A;  . Hernia repair      L inguinal   Family History  Problem Relation Age of Onset  . Heart disease Father   . Diabetes Father   . Hypertension Father   . Diabetes Mother   . Hypertension Mother   . Colon cancer Neg Hx    History  Substance Use Topics  . Smoking status: Current Every Day Smoker -- 0.50 packs/day    Types: Cigarettes  . Smokeless tobacco: Never Used     Comment: Trying to quit.  . Alcohol Use: No    Review of Systems  Constitutional: Negative for fever.  HENT: Negative for congestion.   Respiratory: Negative for cough and shortness of breath.   Cardiovascular: Negative for chest pain.  Gastrointestinal: Positive for abdominal pain. Negative for nausea, vomiting and diarrhea.  All other systems reviewed and are negative.    Allergies  Review of patient's allergies indicates no known allergies.  Home Medications   Current Outpatient Rx  Name  Route  Sig  Dispense  Refill  . albuterol (PROVENTIL) (2.5 MG/3ML) 0.083% nebulizer solution   Nebulization   Take 3 mLs (2.5 mg total) by nebulization every 6 (six) hours as needed for wheezing.   75 mL   12   . aspirin 81 MG chewable  tablet   Oral   Chew 1 tablet (81 mg total) by mouth daily.   30 tablet   3   . gabapentin (NEURONTIN) 300 MG capsule   Oral   Take 1 capsule (300 mg total) by mouth 3 (three) times daily.   90 capsule   2   . HYDROcodone-acetaminophen (NORCO) 10-325 MG per tablet   Oral   Take 1 tablet by mouth every 6 (six) hours as needed.   120 tablet   0     Do not fill until 07/01/13   . lisinopril (PRINIVIL,ZESTRIL) 20 MG tablet   Oral   Take 1 tablet (20 mg total) by mouth daily.   30 tablet   11   . lovastatin (MEVACOR) 20 MG tablet   Oral   Take 1 tablet (20 mg total) by mouth daily.   30 tablet   11   . NON FORMULARY      PT USES an nebulizer         .  ranitidine (ZANTAC) 150 MG tablet   Oral   Take 1 tablet (150 mg total) by mouth 2 (two) times daily.   60 tablet   11    BP 124/71  Pulse 77  Temp(Src) 97.7 F (36.5 C) (Oral)  Resp 18  SpO2 95% Physical Exam  Nursing note and vitals reviewed. Constitutional: He is oriented to person, place, and time. He appears well-developed and well-nourished. No distress.  HENT:  Head: Normocephalic and atraumatic.  Eyes: Conjunctivae are normal. No scleral icterus.  Neck: Neck supple.  Cardiovascular: Normal rate and intact distal pulses.   Pulmonary/Chest: Effort normal. No stridor. No respiratory distress.  Abdominal: Normal appearance. He exhibits no distension.  Apparent suture protruding slightly from the skin superior to umbilicus.  No erythema.  Mild TTP.  No fluctuance or warmth.    Neurological: He is alert and oriented to person, place, and time.  Skin: Skin is warm and dry. No rash noted.  Psychiatric: He has a normal mood and affect. His behavior is normal.    ED Course  Procedures (including critical care time) Labs Review Labs Reviewed - No data to display Imaging Review No results found.  EKG Interpretation   None       MDM   1. Post-operative complication    53 yo male with what appears to be a suture protruding slightly from the skin just above his umbilicus.  It does not appear infected.  Abdomen soft and nontender.  This has been present for some time.  He attempted to follow up with his surgeon, but was unable to secure an appointment.  He was advised to continue to seek an appointment with his surgeon and his primary doctor if he continues to have trouble getting an appointment with his surgeon.  Return precautions given.     Houston Siren, MD 02/19/13 [phone]

## 2013-02-19 ENCOUNTER — Other Ambulatory Visit: Payer: Self-pay | Admitting: Internal Medicine

## 2013-02-19 ENCOUNTER — Telehealth: Payer: Self-pay | Admitting: *Deleted

## 2013-02-19 DIAGNOSIS — T8130XA Disruption of wound, unspecified, initial encounter: Secondary | ICD-10-CM

## 2013-02-19 NOTE — Discharge Instructions (Signed)
Return to ED if you have drainage, increased pain, or redness spreading from site of protruding suture.

## 2013-02-19 NOTE — Telephone Encounter (Signed)
Pt walks in today to make our office aware that he had made an appt with [facility] ([facility]) that had to be canceled.  Pt has [place] (orange) card which requires referral being made through  [facility] Spoke w/ Nicholaus Corolla at [phone] and she informed me that [facility] does not have any spots available through [facility] at this, but is expected to have slots again in February.  Referral will have to be placed on hold unitl Feb 1st.   Visualized area of concern , no reddness, no drainage, area not warm to the touch. Pt was also seen in ED yesterday for the same problem and was advised to f/u with [facility].  Advised pt to contact the office if he has any pain, redness, or discharge from that area.  Will place new order for general surgeon and send to PCP for signature.  Regenia Skeeter, Elaijah Munoz Cassady1/20/201511:25 AM

## 2013-02-21 NOTE — Telephone Encounter (Signed)
Thanks Darlene. Will the new order be placed in my mailbox in February? Or do I have to order a general surgery referral in EPIC on Feb 1? Just let me know, thanks.  Lesly Dukes, MD  [email or web address] Pager # [phone] Office # [phone]

## 2013-03-01 ENCOUNTER — Ambulatory Visit (INDEPENDENT_AMBULATORY_CARE_PROVIDER_SITE_OTHER): Payer: No Typology Code available for payment source | Admitting: Cardiovascular Disease

## 2013-03-01 ENCOUNTER — Encounter: Payer: Self-pay | Admitting: Cardiovascular Disease

## 2013-03-01 ENCOUNTER — Encounter (INDEPENDENT_AMBULATORY_CARE_PROVIDER_SITE_OTHER): Payer: Self-pay

## 2013-03-01 VITALS — BP 122/84 | HR 79 | Ht 64.0 in | Wt 177.0 lb

## 2013-03-01 DIAGNOSIS — Z720 Tobacco use: Secondary | ICD-10-CM

## 2013-03-01 DIAGNOSIS — T8130XA Disruption of wound, unspecified, initial encounter: Secondary | ICD-10-CM

## 2013-03-01 DIAGNOSIS — F17200 Nicotine dependence, unspecified, uncomplicated: Secondary | ICD-10-CM

## 2013-03-01 DIAGNOSIS — R079 Chest pain, unspecified: Secondary | ICD-10-CM

## 2013-03-01 DIAGNOSIS — I10 Essential (primary) hypertension: Secondary | ICD-10-CM

## 2013-03-01 DIAGNOSIS — T85698A Other mechanical complication of other specified internal prosthetic devices, implants and grafts, initial encounter: Secondary | ICD-10-CM

## 2013-03-01 NOTE — Assessment & Plan Note (Signed)
Resolved normla cardiac CT and myovue Stable

## 2013-03-01 NOTE — Patient Instructions (Signed)
Your physician wants you to follow-up in: YEAR WITH DR NISHAN  You will receive a reminder letter in the mail two months in advance. If you don't receive a letter, please call our office to schedule the follow-up appointment.  Your physician recommends that you continue on your current medications as directed. Please refer to the Current Medication list given to you today. 

## 2013-03-01 NOTE — Assessment & Plan Note (Signed)
Well controlled.  Continue current medications and low sodium Dash type diet.    

## 2013-03-01 NOTE — Progress Notes (Signed)
Patient ID: TREMON Carpenter, male   DOB: 12-Mar-1960, 53 y.o.   MRN: [id number] 53 yo referred by [facility] ER for SSCP. Seen 1/5 /12 and R/O with negative enzymes, NAD on CXR and ECG with no acute changes. Reviewed visit. Randomize in [facility] trial to CT  Calcium score 0 and no significant CAD. Distal circ and RCA not well visualized Smokes 1ppd for years. Discussed smoking cessation for less than 10 minutes and relationship between it and vascular disease. CXR 11/03/11 reviewed and NAD Needs umbilical hernia repair He is confused about whats going on. Appears that [facility] arranged PFT;s and cardiac clearence. He is clear to have surgery from cardiac stand point. Needs PFT;s done and F/U with [facility] with surgical referral.  Poor medical F/U with no primary. Currently not working and sedentary.  Labs 10/13 reviewed LDL 121   He was seen in the ED in 07/2012 with CP. CEs were neg. CXR was neg for anything acute. Of note, he has evidence of old healed rib fractures on the left. He was seen in follow up by his PCP and referred here for further evaluation. He notes L sided CP for ~ 2 years. States it is tight "like a vice." He notes it gets worse at times and may last 1-2 minutes. No real exacerbating factors. Denies significant worsening with exertion. He denies any radiating symptoms. No associated nausea. He does note diaphoresis. He does note DOE. He is limited by his back pain. No orthopnea, PND, edema. Seen by PA some relief with zantac and NSAI's but still having some exertional and positional pains  F/U myovue normal : 11/07/12  Although some artifact  Overall Impression: Low risk stress nuclear study . There is very mild, inhomogeneous attenuation of the entire inferior wall that appears to be due to artifcact. The inferior wall contracts normally. The LV function is normal. . no  LV Ejection Fraction: 64%. LV Wall Motion: NL LV Function; NL Wall Motion.  Having back pain Has orange card and seeing  someone in February Had umbilical hernia repair with Dr Redmond Pulling in December appears to have a retained suture that is painful.  He indicates that they would not take it out without charging him $200 so he waited until he got his orange card    ROS: Denies fever, malais, weight loss, blurry vision, decreased visual acuity, cough, sputum, SOB, hemoptysis, pleuritic pain, palpitaitons, heartburn, abdominal pain, melena, lower extremity edema, claudication, or rash.  All other systems reviewed and negative  General: Affect appropriate Healthy:  appears stated age 53: normal Neck supple with no adenopathy JVP normal no bruits no thyromegaly Lungs clear with no wheezing and good diaphragmatic motion Heart:  S1/S2 no murmur, no rub, gallop or click PMI normal Abdomen: benighn, BS positve, no tenderness, no AAA Painful retained suture at 53:98 over umbilicus no bruit.  No HSM or HJR Distal pulses intact with no bruits No edema Neuro non-focal Skin warm and dry No muscular weakness   Current Outpatient Prescriptions  Medication Sig Dispense Refill  . albuterol (PROVENTIL) (2.5 MG/3ML) 0.083% nebulizer solution Take 3 mLs (2.5 mg total) by nebulization every 6 (six) hours as needed for wheezing.  75 mL  12  . aspirin 81 MG chewable tablet Chew 1 tablet (81 mg total) by mouth daily.  30 tablet  3  . gabapentin (NEURONTIN) 300 MG capsule TAKE 1 CAPSULE BY MOUTH THREE TIMES DAILY  90 capsule  6  . HYDROcodone-acetaminophen (NORCO) 10-325  MG per tablet Take 1 tablet by mouth every 6 (six) hours as needed.  120 tablet  0  . lisinopril (PRINIVIL,ZESTRIL) 20 MG tablet Take 1 tablet (20 mg total) by mouth daily.  30 tablet  11  . lovastatin (MEVACOR) 20 MG tablet Take 1 tablet (20 mg total) by mouth daily.  30 tablet  11  . NON FORMULARY PT USES an nebulizer      . ranitidine (ZANTAC) 150 MG tablet Take 1 tablet (150 mg total) by mouth 2 (two) times daily.  60 tablet  11   No current  facility-administered medications for this visit.    Allergies  Review of patient's allergies indicates no known allergies.  Electrocardiogram:  SR rate 81 normal   Assessment and Plan

## 2013-03-01 NOTE — Assessment & Plan Note (Signed)
Counseled for less than 10 minutes little motivation to quit It helps my pain

## 2013-03-01 NOTE — Assessment & Plan Note (Signed)
Called Dr Dois Davenport office They will see him today at 11:10

## 2013-03-14 ENCOUNTER — Encounter (INDEPENDENT_AMBULATORY_CARE_PROVIDER_SITE_OTHER): Payer: Self-pay | Admitting: General Surgery

## 2013-03-14 ENCOUNTER — Ambulatory Visit (INDEPENDENT_AMBULATORY_CARE_PROVIDER_SITE_OTHER): Payer: PRIVATE HEALTH INSURANCE | Admitting: General Surgery

## 2013-03-14 ENCOUNTER — Encounter (INDEPENDENT_AMBULATORY_CARE_PROVIDER_SITE_OTHER): Payer: Self-pay

## 2013-03-14 VITALS — BP 118/68 | HR 76 | Temp 98.4°F | Resp 14 | Ht 64.0 in | Wt 176.8 lb

## 2013-03-14 DIAGNOSIS — T8130XA Disruption of wound, unspecified, initial encounter: Secondary | ICD-10-CM

## 2013-03-14 DIAGNOSIS — T85698A Other mechanical complication of other specified internal prosthetic devices, implants and grafts, initial encounter: Secondary | ICD-10-CM

## 2013-03-14 NOTE — Progress Notes (Signed)
Subjective:     Patient ID: Vernon Carpenter, male   DOB: 03-14-1960, 53 y.o.   MRN: [id number]  HPI 53 year old male comes in for followup because of a stitch protruding through his skin. He underwent open repair of umbilical hernia with mesh on 01/10/2012. He states that he's had something sticking out for several months. It causes him some occasional discomfort. He denies any fevers or chills or purulent drainage. He is still smoking.  PMHx, PSHx, SOCHx, FAMHx, ALL reviewed   Review of Systems 6 point ROS performed and negative except for above    Objective:   Physical Exam BP 118/68  Pulse 76  Temp(Src) 98.4 F (36.9 C) (Oral)  Resp 14  Ht 5\' 4"  (1.626 m)  Wt 176 lb 12.8 oz (80.196 kg)  BMI 30.33 kg/m2 Alert, nad Protuberant abdomen, Well-healed infraumbilical transverse incision. Superior to the umbilicus at the 24:09 position there is a small protrusion of about 1-2 mm in size. There is no surrounding cellulitis.    Assessment:     Status post umbilical hernia repair with mesh Spitting stitch     Plan:     I explained that he has a stitch that is spitting through the skin. We discussed treatment options such as observation versus a small incision and trimming of the stitch. After obtaining verbal consent the patient preferred to have the area trimmed. The area was prepped with ChloraPrep. 2 cc of course a Marcaine with epinephrine was infiltrated around the spitting suture. I then made a small punctate incision with a #11 blade. The tail of the suture was lifted with a hemostat and then trimmed sharply with scissors. There was minimal bleeding. Benzoin, Steri-Strips, 2 x 2 and tape were applied. The patient tolerated the procedure well. He was given wound care instructions. He was advised on what to call for. Followup in 4 weeks for wound check  Leighton Ruff. Redmond Pulling, MD, FACS General, Bariatric, & Minimally Invasive Surgery [facility], [place]

## 2013-03-14 NOTE — Patient Instructions (Signed)
May shower Saturday. May remove gauze and tape on Saturday. White strips directly on skin will peel off over next week or 2. Please call for fever greater than 101, signs of wound infection such as redness on the skin or foul-smelling purulent drainage

## 2013-03-21 ENCOUNTER — Telehealth: Payer: Self-pay | Admitting: *Deleted

## 2013-03-21 NOTE — Telephone Encounter (Signed)
Pt requesting a letter from Dr Lucila Maine for the courts to help w/obtaining disability. An appt has been schedule on March 4th to discuss this issue.

## 2013-04-03 ENCOUNTER — Encounter: Payer: Self-pay | Admitting: Internal Medicine

## 2013-04-03 ENCOUNTER — Ambulatory Visit (INDEPENDENT_AMBULATORY_CARE_PROVIDER_SITE_OTHER): Payer: No Typology Code available for payment source | Admitting: Internal Medicine

## 2013-04-03 VITALS — BP 110/76 | HR 79 | Temp 97.6°F | Ht 64.0 in | Wt 179.9 lb

## 2013-04-03 DIAGNOSIS — M549 Dorsalgia, unspecified: Secondary | ICD-10-CM

## 2013-04-03 NOTE — Assessment & Plan Note (Signed)
Back pain stable. Patient presents today asking me to send a letter to his child support office explaining why he cannot pay child support because of his disabling back pain which is preventing him from working. I told the patient I am not comfortable doing this.  - I recommended he talk to the office and have them fax a detailed request to [place], which I am happy to review.  - Patient was provided with the fax and phone number of our office. I told him that if he has any further paperwork needs, he can always just call and does not need to have an office visit.

## 2013-04-03 NOTE — Progress Notes (Signed)
Case discussed with Dr. Cater at the time of the visit.  We reviewed the resident's history and exam and pertinent patient test results.  I agree with the assessment, diagnosis and plan of care documented in the resident's note. 

## 2013-04-03 NOTE — Progress Notes (Signed)
Subjective:    Patient ID: Vernon Carpenter, male    DOB: 1960-02-17, 53 y.o.   MRN: [id number]  HPI  Vernon Carpenter is a 53 y.o.man with PMH significant for asthma, HTN, and cervical disc disease with chronic neck pain who presents to discuss a child support form.  Disability application - Patient is applying for social security disability. Trial was 02/07/13. He says there was an issue with getting his MRI report, but the judge has it now and he is still awaiting the decision. He asks me today for help with his child support? He wants me to send a letter to [place] office stating he cannot work. He tells me cannot pay his child support and borrows money from his ex-wife.  Extruding suture - Trimmed by his surgeon Greer Pickerel MD on 03/14/13.   Chronic chest pain - Dr. Johnsie Cancel with Cardiology has been following him. Seen most recently on 1/30. A nuclear stress test was performed on 10/27, low risk. He has also had a normal cardiac CT.   Chronic pain - Stable. He already has opiate refills through June.   Current Outpatient Prescriptions on File Prior to Visit  Medication Sig Dispense Refill  . albuterol (PROVENTIL) (2.5 MG/3ML) 0.083% nebulizer solution Take 3 mLs (2.5 mg total) by nebulization every 6 (six) hours as needed for wheezing.  75 mL  12  . aspirin 81 MG chewable tablet Chew 1 tablet (81 mg total) by mouth daily.  30 tablet  3  . gabapentin (NEURONTIN) 300 MG capsule TAKE 1 CAPSULE BY MOUTH THREE TIMES DAILY  90 capsule  6  . HYDROcodone-acetaminophen (NORCO) 10-325 MG per tablet Take 1 tablet by mouth every 6 (six) hours as needed.  120 tablet  0  . lisinopril (PRINIVIL,ZESTRIL) 20 MG tablet Take 1 tablet (20 mg total) by mouth daily.  30 tablet  11  . lovastatin (MEVACOR) 20 MG tablet Take 1 tablet (20 mg total) by mouth daily.  30 tablet  11  . NON FORMULARY PT USES an nebulizer      . ranitidine (ZANTAC) 150 MG tablet Take 1 tablet (150 mg total) by  mouth 2 (two) times daily.  60 tablet  11    Review of Systems  Constitutional: Negative for fever and chills.  HENT: Negative for congestion and rhinorrhea.  Eyes: Negative for visual disturbance.  Respiratory: Positive for shortness of breath (Stable). Negative for cough.  Gastrointestinal: Negative for nausea, abdominal pain, diarrhea and constipation.  Endocrine: Negative for polydipsia and polyphagia.  Genitourinary: Negative for dysuria, urgency and frequency.  Neurological: Positive for weakness (Stable) and numbness (Stable). Psychiatric/Behavioral: Negative for agitation.  Denies bowel or bladder incontinence.     Objective:   Physical Exam  Constitutional: He is oriented to person, place, and time. He appears well-developed and well-nourished.  Patient winces in pain as I enter the room and flexes/extends neck.  HENT:  Head: Normocephalic and atraumatic.  Eyes: Conjunctivae and EOM are normal. Pupils are equal, round, and reactive to light.  Neck: Normal range of motion. Neck supple.  Cardiovascular: Normal rate, regular rhythm and normal heart sounds. Exam reveals no gallop and no friction rub.  No murmur heard.  Pulmonary/Chest: Effort normal. He has no wheezes. He has no rales.  Abdominal: Soft. There is no tenderness.  Area where fascial suture had been trimmed is now clean and well healed. Musculoskeletal: Normal range of motion. He exhibits no edema and no tenderness.  Neurological: He is alert and oriented to person, place, and time. No cranial nerve deficit.  No focal weakness or other neurologic deficits.  Skin: Skin is warm and dry.  Psychiatric: He has a normal mood and affect.       Assessment & Plan:   Please see problem based charting.

## 2013-04-03 NOTE — Patient Instructions (Signed)
Thanks for your visit. - Our fax number at the clinic is [phone]. Please have the woman at the child support office fax a formal request to our office with the information she needs and we will review it. - The phone number of the clinic is [phone]. If you have any further requests related to paperwork, you can always call the clinic and I can address it without an office visit. - Please return to see me in June unless you have any additional medical issues.

## 2013-04-04 ENCOUNTER — Telehealth: Payer: Self-pay | Admitting: *Deleted

## 2013-04-04 NOTE — Telephone Encounter (Signed)
Returned pt's call - pt stated he talked to the person at child support office and told them what Dr Lucila Maine said; she stated he was lying. According to AVS, Dr Lucila Maine requested them to fax her  Formal request with the info needed. I told pt to give me the # of the person he talked to and I will call her; he stated he will call me back w/the #.

## 2013-04-05 NOTE — Telephone Encounter (Signed)
Talked to Vernon Carpenter at the child support office [phone]). She stated it is [name] responsibility to find out what he needs prior to his support case; what he will needs from his doctor not hers. He needs to talk to his doctor. She did state he needs documentation of why he cannot work, how long he will not be able to work, his medical hx to support this etc.

## 2013-04-08 NOTE — Telephone Encounter (Signed)
  Reason for call:   I placed an outgoing call to Vernon Carpenter at 3:00  PM regarding his child support issues.   Assessment/ Plan:   I spoke with Vernon Carpenter on the phone this afternoon.   It sounds like the child support office needs documentation of why he cannot work, how long he will not be able to work, and his medical history to support this. Glenda spoke with a representative at the office about this.  Vernon Carpenter has an active disability application and was seen by a different doctor for his physical evaluation supporting this application.  I advised Vernon Carpenter to ask *that* doctor for the documentation needed, as our office does not make recommendations or evaluations for ability to work or disability claims, and this issue is along those same lines.  He is amenable and will touch base with them to discuss this issue further.   Lesly Dukes, MD   04/08/2013, 2:59 PM

## 2013-04-11 ENCOUNTER — Encounter (INDEPENDENT_AMBULATORY_CARE_PROVIDER_SITE_OTHER): Payer: Self-pay | Admitting: General Surgery

## 2013-04-11 ENCOUNTER — Ambulatory Visit (INDEPENDENT_AMBULATORY_CARE_PROVIDER_SITE_OTHER): Payer: PRIVATE HEALTH INSURANCE | Admitting: General Surgery

## 2013-04-11 VITALS — BP 108/84 | HR 72 | Temp 98.3°F | Resp 16 | Ht 64.0 in | Wt 177.6 lb

## 2013-04-11 DIAGNOSIS — Z5189 Encounter for other specified aftercare: Secondary | ICD-10-CM

## 2013-04-11 NOTE — Progress Notes (Signed)
Subjective:     Patient ID: [name], male   DOB: 1960/03/15, 53 y.o.   MRN: [id number]  HPI Patient comes back in for wound check. I trimmed a suture that he spit from his open umbilical hernia repair with mesh a few weeks ago  Review of Systems     Objective:   Physical Exam BP 108/84  Pulse 72  Temp(Src) 98.3 F (36.8 C) (Oral)  Resp 16  Ht 5\' 4"  (1.626 m)  Wt 177 lb 9.6 oz (80.559 kg)  BMI 30.47 kg/m2 Alert, nad abd soft, obese/protuberant, well healed incision. No cellulitis/induration    Assessment:     Status post removal of a Spit stitch     Plan:     No signs of wound complication. No sign of hernia recurrence. Followup as needed  Leighton Ruff. Redmond Pulling, MD, FACS General, Bariatric, & Minimally Invasive Surgery [facility], [place]

## 2013-05-15 ENCOUNTER — Emergency Department (HOSPITAL_COMMUNITY): Payer: Medicaid Other

## 2013-05-15 ENCOUNTER — Observation Stay (HOSPITAL_COMMUNITY)
Admission: EM | Admit: 2013-05-15 | Discharge: 2013-05-16 | Disposition: A | Discharge status: Home / Self Care | Payer: Medicaid Other | Attending: Internal Medicine | Admitting: Internal Medicine

## 2013-05-15 ENCOUNTER — Encounter (HOSPITAL_COMMUNITY): Payer: Self-pay | Admitting: Emergency Medicine

## 2013-05-15 DIAGNOSIS — R079 Chest pain, unspecified: Principal | ICD-10-CM | POA: Insufficient documentation

## 2013-05-15 DIAGNOSIS — K429 Umbilical hernia without obstruction or gangrene: Secondary | ICD-10-CM | POA: Diagnosis not present

## 2013-05-15 DIAGNOSIS — J45909 Unspecified asthma, uncomplicated: Secondary | ICD-10-CM | POA: Insufficient documentation

## 2013-05-15 DIAGNOSIS — F3289 Other specified depressive episodes: Secondary | ICD-10-CM | POA: Insufficient documentation

## 2013-05-15 DIAGNOSIS — J4489 Other specified chronic obstructive pulmonary disease: Secondary | ICD-10-CM | POA: Insufficient documentation

## 2013-05-15 DIAGNOSIS — F411 Generalized anxiety disorder: Secondary | ICD-10-CM | POA: Insufficient documentation

## 2013-05-15 DIAGNOSIS — R059 Cough, unspecified: Secondary | ICD-10-CM | POA: Diagnosis not present

## 2013-05-15 DIAGNOSIS — G8929 Other chronic pain: Secondary | ICD-10-CM | POA: Diagnosis not present

## 2013-05-15 DIAGNOSIS — M542 Cervicalgia: Secondary | ICD-10-CM | POA: Diagnosis not present

## 2013-05-15 DIAGNOSIS — R55 Syncope and collapse: Secondary | ICD-10-CM | POA: Diagnosis not present

## 2013-05-15 DIAGNOSIS — E7800 Pure hypercholesterolemia, unspecified: Secondary | ICD-10-CM | POA: Diagnosis not present

## 2013-05-15 DIAGNOSIS — R05 Cough: Secondary | ICD-10-CM | POA: Insufficient documentation

## 2013-05-15 DIAGNOSIS — K219 Gastro-esophageal reflux disease without esophagitis: Secondary | ICD-10-CM | POA: Diagnosis not present

## 2013-05-15 DIAGNOSIS — I10 Essential (primary) hypertension: Secondary | ICD-10-CM | POA: Insufficient documentation

## 2013-05-15 DIAGNOSIS — J9620 Acute and chronic respiratory failure, unspecified whether with hypoxia or hypercapnia: Secondary | ICD-10-CM | POA: Insufficient documentation

## 2013-05-15 DIAGNOSIS — F329 Major depressive disorder, single episode, unspecified: Secondary | ICD-10-CM | POA: Diagnosis not present

## 2013-05-15 DIAGNOSIS — J449 Chronic obstructive pulmonary disease, unspecified: Secondary | ICD-10-CM | POA: Insufficient documentation

## 2013-05-15 DIAGNOSIS — R569 Unspecified convulsions: Secondary | ICD-10-CM

## 2013-05-15 DIAGNOSIS — M722 Plantar fascial fibromatosis: Secondary | ICD-10-CM | POA: Diagnosis not present

## 2013-05-15 HISTORY — DX: Pure hypercholesterolemia, unspecified: E78.00

## 2013-05-15 HISTORY — DX: Low back pain, unspecified: M54.50

## 2013-05-15 HISTORY — DX: Other chronic pain: G89.29

## 2013-05-15 HISTORY — DX: Anxiety disorder, unspecified: F41.9

## 2013-05-15 HISTORY — DX: Low back pain: M54.5

## 2013-05-15 LAB — D-DIMER, QUANTITATIVE: D-Dimer, Quant: 0.4 ug/mL-FEU (ref 0.00–0.48)

## 2013-05-15 LAB — CBC
HCT: 44.4 % (ref 39.0–52.0)
HEMOGLOBIN: 15 g/dL (ref 13.0–17.0)
MCH: 31.5 pg (ref 26.0–34.0)
MCHC: 33.8 g/dL (ref 30.0–36.0)
MCV: 93.3 fL (ref 78.0–100.0)
PLATELETS: 231 10*3/uL (ref 150–400)
RBC: 4.76 MIL/uL (ref 4.22–5.81)
RDW: 12.7 % (ref 11.5–15.5)
WBC: 8.2 10*3/uL (ref 4.0–10.5)

## 2013-05-15 LAB — BASIC METABOLIC PANEL
BUN: 11 mg/dL (ref 6–23)
CALCIUM: 9.6 mg/dL (ref 8.4–10.5)
CO2: 25 mEq/L (ref 19–32)
Chloride: 98 mEq/L (ref 96–112)
Creatinine, Ser: 0.88 mg/dL (ref 0.50–1.35)
GFR calc Af Amer: >90 mL/min (ref >90)
Glucose, Bld: 135 mg/dL — ABNORMAL HIGH (ref 70–99)
Potassium: 4.5 mEq/L (ref 3.7–5.3)
SODIUM: 137 meq/L (ref 137–147)

## 2013-05-15 LAB — I-STAT TROPONIN, ED
Troponin i, poc: 0 ng/mL (ref 0.00–0.08)
Troponin i, poc: 0 ng/mL (ref 0.00–0.08)

## 2013-05-15 MED ORDER — ONDANSETRON HCL 4 MG/2ML IJ SOLN: 4.0000 mg | Freq: Three times a day (TID) | INTRAMUSCULAR | Status: DC | PRN

## 2013-05-15 MED ORDER — PNEUMOCOCCAL VAC POLYVALENT 25 MCG/0.5ML IJ INJ
0.5000 mL | INJECTION | INTRAMUSCULAR | Status: DC
  Filled 2013-05-15: qty 0.5

## 2013-05-15 MED ORDER — LISINOPRIL 20 MG PO TABS
20.0000 mg | ORAL_TABLET | Freq: Every day | ORAL | Status: DC
  Administered 2013-05-16: 20 mg via ORAL
  Filled 2013-05-15: qty 1

## 2013-05-15 MED ORDER — ASPIRIN 81 MG PO CHEW
324.0000 mg | CHEWABLE_TABLET | Freq: Once | ORAL | Status: AC
  Administered 2013-05-15: 324 mg via ORAL
  Filled 2013-05-15: qty 4

## 2013-05-15 MED ORDER — ASPIRIN EC 81 MG PO TBEC
81.0000 mg | DELAYED_RELEASE_TABLET | Freq: Every day | ORAL | Status: DC
  Administered 2013-05-16: 81 mg via ORAL
  Filled 2013-05-15: qty 1

## 2013-05-15 MED ORDER — SIMVASTATIN 10 MG PO TABS
10.0000 mg | ORAL_TABLET | Freq: Every day | ORAL | Status: DC
  Filled 2013-05-15: qty 1

## 2013-05-15 MED ORDER — FAMOTIDINE 20 MG PO TABS
20.0000 mg | ORAL_TABLET | Freq: Two times a day (BID) | ORAL | Status: DC
  Administered 2013-05-15 – 2013-05-16 (×2): 20 mg via ORAL
  Filled 2013-05-15 (×3): qty 1

## 2013-05-15 MED ORDER — ALBUTEROL SULFATE (2.5 MG/3ML) 0.083% IN NEBU
2.5000 mg | INHALATION_SOLUTION | Freq: Four times a day (QID) | RESPIRATORY_TRACT | Status: DC
  Administered 2013-05-16 (×3): 2.5 mg via RESPIRATORY_TRACT
  Filled 2013-05-15 (×2): qty 3

## 2013-05-15 MED ORDER — HYDROCODONE-ACETAMINOPHEN 10-325 MG PO TABS
1.0000 | ORAL_TABLET | Freq: Four times a day (QID) | ORAL | Status: DC | PRN
  Administered 2013-05-15: 1 via ORAL
  Filled 2013-05-15: qty 1

## 2013-05-15 MED ORDER — SODIUM CHLORIDE 0.9 % IJ SOLN
3.0000 mL | Freq: Two times a day (BID) | INTRAMUSCULAR | Status: DC
  Administered 2013-05-15: 3 mL via INTRAVENOUS

## 2013-05-15 MED ORDER — GABAPENTIN 300 MG PO CAPS
300.0000 mg | ORAL_CAPSULE | Freq: Three times a day (TID) | ORAL | Status: DC
  Administered 2013-05-15 – 2013-05-16 (×2): 300 mg via ORAL
  Filled 2013-05-15 (×4): qty 1

## 2013-05-15 MED ORDER — SODIUM CHLORIDE 0.9 % IV BOLUS (SEPSIS)
1000.0000 mL | Freq: Once | INTRAVENOUS | Status: AC
  Administered 2013-05-15: 1000 mL via INTRAVENOUS

## 2013-05-15 MED ORDER — ALBUTEROL SULFATE (2.5 MG/3ML) 0.083% IN NEBU: 2.5000 mg | INHALATION_SOLUTION | Freq: Four times a day (QID) | RESPIRATORY_TRACT | Status: DC | PRN

## 2013-05-15 MED ORDER — NICOTINE 21 MG/24HR TD PT24
21.0000 mg | MEDICATED_PATCH | Freq: Every day | TRANSDERMAL | Status: DC
  Administered 2013-05-15 – 2013-05-16 (×2): 21 mg via TRANSDERMAL
  Filled 2013-05-15 (×2): qty 1

## 2013-05-15 MED ORDER — ENOXAPARIN SODIUM 40 MG/0.4ML ~~LOC~~ SOLN
40.0000 mg | SUBCUTANEOUS | Status: DC
  Administered 2013-05-15: 40 mg via SUBCUTANEOUS
  Filled 2013-05-15 (×2): qty 0.4

## 2013-05-15 NOTE — ED Provider Notes (Signed)
CSN: [id number]     Arrival date & time 05/15/13  75 History   First MD Initiated Contact with Patient 05/15/13 1411     Chief Complaint  Patient presents with  . Chest Pain  . Near Syncope  . Back Pain     (Consider location/radiation/quality/duration/timing/severity/associated sxs/prior Treatment) HPI Comments: 53 yo male with chronic smoking, chronic back pain, hernia surgery, copd, htn presents with chest pain, syncope and seizure activity.  Pt was standing on the porch smoking and had sudden syncope and brief right seizure activity.  No hx of seizure or known cardiac issues, no cath hx.  Pt has had intermittent exertional cp for months, brief, non radiating. Chronic back pain lower.  Pt feels okay currently, mild shaken from episode. No blood thinners. No ha or vomiting.  Patient is a 53 y.o. male presenting with chest pain, near-syncope, and back pain. The history is provided by the patient.  Chest Pain Associated symptoms: back pain and near-syncope   Associated symptoms: no abdominal pain, no fever, no headache, no shortness of breath, not vomiting and no weakness   Near Syncope Associated symptoms include chest pain. Pertinent negatives include no abdominal pain, no headaches and no shortness of breath.  Back Pain Associated symptoms: chest pain   Associated symptoms: no abdominal pain, no dysuria, no fever, no headaches and no weakness     Past Medical History  Diagnosis Date  . Acute and chronic respiratory failure   . BACK PAIN   . CHEST PAIN     12/2011 seen by Dr. Johnsie Cancel and cleared for hernia repair surgery  . Plantar fasciitis of left foot   . Neck pain   . Cough   . Umbilical hernia   . COPD (chronic obstructive pulmonary disease)   . Asthma   . Arthritis   . Hypertension   . Depression   . GERD (gastroesophageal reflux disease)    Past Surgical History  Procedure Laterality Date  . S/p cervical discectomy      C6-C7  . Umbilical hernia repair   01/10/2012    Procedure: HERNIA REPAIR UMBILICAL ADULT;  Surgeon: Gayland Curry, MD,FACS;  Location: [place];  Service: General;  Laterality: N/A;  . Insertion of mesh  01/10/2012    Procedure: INSERTION OF MESH;  Surgeon: Gayland Curry, MD,FACS;  Location: [place];  Service: General;  Laterality: N/A;  . Hernia repair      L inguinal   Family History  Problem Relation Age of Onset  . Heart disease Father   . Diabetes Father   . Hypertension Father   . Diabetes Mother   . Hypertension Mother   . Colon cancer Neg Hx    History  Substance Use Topics  . Smoking status: Current Every Day Smoker -- 0.50 packs/day    Types: Cigarettes  . Smokeless tobacco: Never Used     Comment: Trying to quit.  . Alcohol Use: No    Review of Systems  Constitutional: Negative for fever and chills.  HENT: Negative for congestion.   Eyes: Negative for visual disturbance.  Respiratory: Negative for shortness of breath.   Cardiovascular: Positive for chest pain and near-syncope.  Gastrointestinal: Negative for vomiting and abdominal pain.  Genitourinary: Negative for dysuria and flank pain.  Musculoskeletal: Positive for back pain. Negative for neck pain and neck stiffness.  Skin: Negative for rash.  Neurological: Positive for syncope. Negative for weakness, light-headedness and headaches.      Allergies  Review of patient's allergies indicates no known allergies.  Home Medications   Prior to Admission medications   Medication Sig Start Date End Date Taking? Authorizing Provider  gabapentin (NEURONTIN) 300 MG capsule Take 300 mg by mouth 3 (three) times daily.   Yes Historical Provider, MD  HYDROcodone-acetaminophen (NORCO) 10-325 MG per tablet Take 1 tablet by mouth every 6 (six) hours as needed. 01/09/13  Yes Lesly Dukes, MD  lisinopril (PRINIVIL,ZESTRIL) 20 MG tablet Take 1 tablet (20 mg total) by mouth daily. 10/31/12 10/31/13 Yes Lesly Dukes, MD  lovastatin (MEVACOR) 20 MG tablet Take 1 tablet  (20 mg total) by mouth daily. 10/31/12 10/31/13 Yes Lesly Dukes, MD  ranitidine (ZANTAC) 150 MG tablet Take 1 tablet (150 mg total) by mouth 2 (two) times daily. 01/09/13 01/09/14 Yes Lesly Dukes, MD  albuterol (PROVENTIL) (2.5 MG/3ML) 0.083% nebulizer solution Take 3 mLs (2.5 mg total) by nebulization every 6 (six) hours as needed for wheezing. 11/07/12   Lesly Dukes, MD   BP 116/77  Pulse 89  Temp(Src) 97.7 F (36.5 C) (Oral)  Resp 20  SpO2 97% Physical Exam  Nursing note and vitals reviewed. Constitutional: He is oriented to person, place, and time. He appears well-developed and well-nourished.  HENT:  Head: Normocephalic and atraumatic.  Eyes: Conjunctivae are normal. Right eye exhibits no discharge. Left eye exhibits no discharge.  Neck: Normal range of motion. Neck supple. No tracheal deviation present.  Cardiovascular: Normal rate and regular rhythm.   Pulmonary/Chest: Effort normal and breath sounds normal.  Abdominal: Soft. He exhibits no distension. There is no tenderness. There is no guarding.  Musculoskeletal: He exhibits no edema.  Neurological: He is alert and oriented to person, place, and time. GCS eye subscore is 4. GCS verbal subscore is 5. GCS motor subscore is 6.  5+ strength in UE and LE with f/e at major joints. Sensation to palpation intact in UE and LE. CNs 2-12 grossly intact.  EOMFI.  PERRL.   Finger nose and coordination intact bilateral.   Visual fields intact to finger testing.   Skin: Skin is warm. No rash noted.  Psychiatric: He has a normal mood and affect.    ED Course  Procedures (including critical care time) Labs Review Labs Reviewed  BASIC METABOLIC PANEL - Abnormal; Notable for the following:    Glucose, Bld 135 (*)    All other components within normal limits  CBC  D-DIMER, QUANTITATIVE  I-STAT TROPOININ, ED  Randolm Idol, ED    Imaging Review Dg Chest 2 View  05/15/2013   CLINICAL DATA:  CHEST PAIN NEAR SYNCOPE BACK PAIN  EXAM:  CHEST  2 VIEW  COMPARISON:  DG CHEST 2 VIEW dated 08/30/2012  FINDINGS: The heart size and mediastinal contours are within normal limits. Both lungs are clear. The visualized skeletal structures are unremarkable.  IMPRESSION: No active cardiopulmonary disease.   Electronically Signed   By: Margaree Mackintosh M.D.   On: 05/15/2013 15:38   Ct Head Wo Contrast  05/15/2013   CLINICAL DATA:  Sudden onset of syncope  EXAM: CT HEAD WITHOUT CONTRAST  TECHNIQUE: Contiguous axial images were obtained from the base of the skull through the vertex without intravenous contrast.  COMPARISON:  None.  FINDINGS: The ventricles are normal in size and position. There is no intracranial hemorrhage nor intracranial mass effect. There is no evidence of an evolving ischemic infarction. The cerebellum and brainstem are normal in density. There are no abnormal intracranial calcifications.  At bone window  settings there is no evidence of an acute skull fracture.  IMPRESSION: There is no acute intracranial abnormality.   Electronically Signed   By: David  Martinique   On: 05/15/2013 16:12     EKG Interpretation   Date/Time:  Wednesday May 15 2013 13:35:01 EDT Ventricular Rate:  81 PR Interval:  106 QRS Duration: 96 QT Interval:  359 QTC Calculation: 417 R Axis:   73 Text Interpretation:  Sinus rhythm Short PR interval Similar to previous  Confirmed by Habiba Treloar  MD, Raymone Pembroke ([id number]) on 05/15/2013 2:48:11 PM      MDM   Final diagnoses:  None  Syncope Chest pain  Concern for syncope and cp, no sxs prior to event.  Smoker/ HTN. Seizure activity likely myoclonic jerks with syncope however may be seizure/ effect of cardiac arrhythmia/ syncope. Pt on monitor.  EKG no acute findings except short pr.  CT head.  Low risk pe, d dimer ordered. Fluid bolus.Ddimer neg, no ct indicated.   2014 Nuclear study Overall Impression: Low risk stress nuclear study . There is very mild, inhomogeneous attenuation of the entire inferior wall that  appears to be due to artifcact. The inferior wall contracts normally. The LV function is normal. . no  LV Ejection Fraction: 64%. LV Wall Motion: NL LV Function; NL Wall Motion.  Plan for admission.  The patients results and plan were reviewed and discussed.   Any x-rays performed were personally reviewed by myself.   Differential diagnosis were considered with the presenting HPI.  EKG: reviewed  Filed Vitals:   05/15/13 1335  BP: 116/77  Pulse: 89  Temp: 97.7 F (36.5 C)  TempSrc: Oral  Resp: 20  SpO2: 97%   Paged resident, FP called back, repaged IM.   Signed out with plan to transfer to [facility] IM resident.      Mariea Clonts, MD 05/15/13 [phone]

## 2013-05-15 NOTE — H&P (Signed)
Date: 05/15/2013               Patient Name:  Vernon Carpenter MRN: [id number]  DOB: December 28, 1960 Age / Sex: 53 y.o., male   PCP: Lesly Dukes, MD              Medical Service: Internal Medicine Teaching Service              Attending Physician: Dr. Bartholomew Crews, MD    First Contact: Dr. Ronnald Ramp Pager: [phone]  Second Contact: Dr. Eula Fried Pager: [phone]            After Hours (After 5p/  First Contact Pager: [phone]  weekends / holidays): Second Contact Pager: [phone]   Chief Complaint: Syncope  History of Present Illness: 53 year old gentleman, with past medical history of cigarette smoking, COPD, hypertension, intermittent chest pain, presents after an episode of syncope that happened at home around 11am on 05/15/2012. Patient was with his wife on the porch smoking a cigarette when he started gagging and coughing; felt lightheaded and he passed out for a few seconds. His wife who was present during the entire event reported that he had jerking of his legs and hands for a few seconds. When he woke up he was able to walk back into the house with minimal assistance by his wife. He did not appear confused and he was feeling normal. However, EMS was called at the insistence of his wife due to concern of seizures and he was transported to [facility] ED.  No preceding chest pain, shortness of breath, palpitations, diaphoresis, fevers, increased cough, chills or vomiting, however, the patient reports nausea.  No fecal or urinary incontinence; and no tongue biting. No stroke symptoms reported by the patient at the time of our evaluation. Patient denies personal or family history of seizures. However, patient continues to report ongoing chronic back pain, and neck pain due to degenerative disc disease, for which he takes Norco. No recent change in his medications. He also reports that he had his regular breakfast that morning .  In terms of his intermittent chest pain, patient reports that he  experiences chest pain, especially when he is cutting grass, which resolves with rest. Last October, he was evaluated for CAD- which included a Myoview and  cardiac CT, which were both low risk. He has never had a cardiac catheterization. He had outpatient followup visit with Dr Johnsie Cancel of cardiology, who did not recommend further medical therapy since the chest pain had largely resolved.  Review of Systems: Constitutional: Denies fever, chills, diaphoresis, appetite change and fatigue.  HEENT: Denies photophobia, eye pain Respiratory: Reports exertional SOB, cough, with wheezing.  Gastrointestinal: Denies nausea, vomiting, abdominal pain, diarrhea, constipation.  He endorses intermittent right sided abdominal spasms, which are mostly positional. No blood in stool and abdominal distention.  Genitourinary: Denies dysuria, urgency, frequency, hematuria, flank pain and difficulty urinating.  Musculoskeletal: Denies myalgias, joint swelling, arthralgias and gait problem. Has chronic low back pain and neck pain. Hematological: Denies adenopathy. Easy bruising, personal or family bleeding history  Psychiatric/Behavioral: Denies suicidal ideation, mood changes, confusion, nervousness, sleep disturbance and agitation  Meds: Prescriptions prior to admission  Medication Sig Dispense Refill  . gabapentin (NEURONTIN) 300 MG capsule Take 300 mg by mouth 3 (three) times daily.      Marland Kitchen HYDROcodone-acetaminophen (NORCO) 10-325 MG per tablet Take 1 tablet by mouth every 6 (six) hours as needed.  120 tablet  0  . lisinopril (PRINIVIL,ZESTRIL) 20 MG tablet  Take 1 tablet (20 mg total) by mouth daily.  30 tablet  11  . lovastatin (MEVACOR) 20 MG tablet Take 1 tablet (20 mg total) by mouth daily.  30 tablet  11  . ranitidine (ZANTAC) 150 MG tablet Take 1 tablet (150 mg total) by mouth 2 (two) times daily.  60 tablet  11  . albuterol (PROVENTIL) (2.5 MG/3ML) 0.083% nebulizer solution Take 3 mLs (2.5 mg total) by  nebulization every 6 (six) hours as needed for wheezing.  75 mL  12   Allergies: Allergies as of 05/15/2013  . (No Known Allergies)   Past Medical History  Diagnosis Date  . Acute and chronic respiratory failure   . BACK PAIN   . CHEST PAIN     12/2011 seen by Dr. Johnsie Cancel and cleared for hernia repair surgery  . Plantar fasciitis of left foot   . Neck pain   . Cough   . Umbilical hernia   . COPD (chronic obstructive pulmonary disease)   . Asthma   . Arthritis   . Hypertension   . Depression   . GERD (gastroesophageal reflux disease)    Past Surgical History  Procedure Laterality Date  . S/p cervical discectomy      C6-C7  . Umbilical hernia repair  01/10/2012    Procedure: HERNIA REPAIR UMBILICAL ADULT;  Surgeon: Gayland Curry, MD,FACS;  Location: [place];  Service: General;  Laterality: N/A;  . Insertion of mesh  01/10/2012    Procedure: INSERTION OF MESH;  Surgeon: Gayland Curry, MD,FACS;  Location: [place];  Service: General;  Laterality: N/A;  . Hernia repair      L inguinal   Family History  Problem Relation Age of Onset  . Heart disease Father   . Diabetes Father   . Hypertension Father   . Diabetes Mother   . Hypertension Mother   . Colon cancer Neg Hx    History   Social History  . Marital Status: Married    Spouse Name: N/A    Number of Children: N/A  . Years of Education: N/A   Occupational History  . Not on file.   Social History Main Topics  . Smoking status: Current Every Day Smoker -- 0.50 packs/day    Types: Cigarettes  . Smokeless tobacco: Never Used     Comment: Trying to quit.  . Alcohol Use: No  . Drug Use: No  . Sexual Activity: Not on file   Other Topics Concern  . Not on file   Social History Narrative  . No narrative on file      Physical Exam: Filed Vitals:   05/15/13 1947  BP: 131/77  Pulse: 68  Temp: 98.1 F (36.7 C)  Resp: 18   General: well developed, well nourished; no acute distressed, cooperative with  exam Head: atraumatic, normocephalic,  Eye: sclera anicteric; normal conjunctiva  Nose/throat: oropharynx clear, moist mucous membranes, pink gums  Neck: supple, no carotid bruits Lungs/Chest wall:  widespread bilateral, mild wheezing with normal work of breathing  Heart: normal rate and regular rhythm; no murmurs Pulses: radial and dorsalis pedis pulses are 2+ and symmetric  Abdomen: Normal fullness, no rebound, guarding, or rigidity; normal bowel sounds; no masses or organomegaly  Skin: warm, dry, intact, normal turgor, no rashes  Extremities: no peripheral edema, clubbing, or cyanosis  Neurologic Exam:   Mental Status: Alert, oriented, thought content appropriate. Speech fluent without evidence of aphasia. Able to follow 3 step commands without difficulty.  Cranial Nerves:      III/IV/VI: Extraocular movements intact.  Pupils reactive bilaterally.  V/VII: Smile symmetric.Facial light touch sensation normal bilaterally.  VIII: Grossly intact.  IX/X: Normal gag.  XI: Bilateral shoulder shrug normal.  XII: Midline tongue extension normal.  Motor:  5/5 bilaterally with normal tone and bulk  Sensory:  Light touch intact throughout, bilaterally  DTRs: 2+ and symmetric throughout  Plantars:  Downgoing bilaterally  Cerebellar: Normal finger-to-nose, normal rapid alternating movements. Failed tandem work.Marland Kitchen   Psych: patient is alert and oriented, mood and affect are normal and congruent, thought content is normal without delusions, thought process is linear, speech is normal and non-pressured, behavior is normal   Lab results: Basic Metabolic Panel:  Recent Labs  05/15/13 1411  NA 137  K 4.5  CL 98  CO2 25  GLUCOSE 135*  BUN 11  CREATININE 0.88  CALCIUM 9.6   CBC:  Recent Labs  05/15/13 1411  WBC 8.2  HGB 15.0  HCT 44.4  MCV 93.3  PLT 231   D-Dimer:  Recent Labs  05/15/13 1411  DDIMER 0.40     Imaging results:  Dg Chest 2 View  05/15/2013   CLINICAL DATA:   CHEST PAIN NEAR SYNCOPE BACK PAIN  EXAM: CHEST  2 VIEW  COMPARISON:  DG CHEST 2 VIEW dated 08/30/2012  FINDINGS: The heart size and mediastinal contours are within normal limits. Both lungs are clear. The visualized skeletal structures are unremarkable.  IMPRESSION: No active cardiopulmonary disease.   Electronically Signed   By: Margaree Mackintosh M.D.   On: 05/15/2013 15:38   Ct Head Wo Contrast  05/15/2013   CLINICAL DATA:  Sudden onset of syncope  EXAM: CT HEAD WITHOUT CONTRAST  TECHNIQUE: Contiguous axial images were obtained from the base of the skull through the vertex without intravenous contrast.  COMPARISON:  None.  FINDINGS: The ventricles are normal in size and position. There is no intracranial hemorrhage nor intracranial mass effect. There is no evidence of an evolving ischemic infarction. The cerebellum and brainstem are normal in density. There are no abnormal intracranial calcifications.  At bone window settings there is no evidence of an acute skull fracture.  IMPRESSION: There is no acute intracranial abnormality.   Electronically Signed   By: David  Martinique   On: 05/15/2013 16:12    Other results: EKG: Normal sinus rhythm with ventricular rate of 81 beats per minute, short PR interval of 106, notable nonsignificant Q. waves in the inferolateral leads. No ST segment or T wave abnormalities.  Assessment & Plan by Problem: Principal Problem:   Syncope and collapse Active Problems:   Essential hypertension, benign   Intermittent chest pain  53 year old gentleman, with past medical history of cigarette smoking, COPD, hypertension, intermittent chest pain, presents after an episode of syncope that happened at home around 11am on 05/15/2012. There was a concern for seizure as reported by his wife.  #Syncope: Differential diagnosis include orthostatic hypotension, vasovagal syncope episode, Situational syncope( which can be provoked by coughing), transient hypoxia (given history of coughing  and gagging while smoking a cigarette before the episode). EKG largely normal except for insignificant Q waves in inferior and lateral leads , and short PR interval of 106, looking back at prior EKGs pt has always had a Short PR interval- Range- 106-112, but with normal QRS complexes, this puts him at risk of pre-excitation syndromes and therefore tachyarrhythmias- though this is rare. Cardiac arrythmias can therefore not be excluded, especially given the  rapidity of recovery. ACS- Less likely given results of nuclear study done in 2014, Neg Trops (2 I-stat Trops) and EKG findings.  Aortic stenosis, exam negative for murmur. Doubt seizures as patient denied tongue biting, fecal or urinary incontinence and says he quickly regained consciousness after a few seconds, but if pt really had a seizure, it could be related to a cardiac induced Syncope and hypoxia. Head CT negative for any explanation for ?Seizures. Careful neurologic exam is normal but he will need to be observed overnight on Tele in case of new symptoms or signs.  PE is less likely with negative D. Dimers. Plan. -Admit to telemetry for observation.  -Seizure precautions. -Cycle cardiac enzymes. -Obtained a.m EKG to assess for dynamic EKG changes. -Frequent neuro checks overnight - Will allow oral intake. - If patient develops further neurological symptoms or signs, a neurological consult can be obtained for consideration of MRI of the brain and EEG.  - If work up and tele fails to reveal a cause, a holter monitor should be considered. - Continued pts aspirin and   # Intermittent chest pain/stable angina: Patient has significant risk factors for coronary artery disease, including cigarette smoking, and hypertension. He endorses intermittent chest pain, with moderate exercise. Has been evaluated by cardiology and a negative Myoview and a coronary CT scan on 10/2012 were low risk. Plan. - Keep on telemetry, a.m. EKG, and cycle troponin - can  follow up with outpatient cardiology    #Hypertension: BP stable. Continue with home medications   #COPD: No prior PFTs. Takes albuterol inhaler. No evidence of COPD, exacerbation but physical exam with widespread mild wheezing. Will administer albuterol nebulizer every 6.    Dispo: Disposition is deferred at this time, awaiting improvement of current medical problems. Anticipated discharge in approximately 1-2 day(s).   The patient does have a current PCP Lesly Dukes, MD), therefore will be require [facility]PC follow-up after discharge.   The patient does not have transportation limitations that hinder transportation to clinic appointments.   Signed:  Bing Neighbors, MD PGY-1 Internal Medicine Teaching Service Pager: [phone] (7pm-7am) 05/15/2013,

## 2013-05-15 NOTE — Progress Notes (Signed)
[facility] CL spoke with patient about Vernon Carpenter. Patient confirmed PCP is [place]. CL scheduled patient a follow-up apt on 4/22 with PCP at 10:15 am.

## 2013-05-15 NOTE — ED Notes (Signed)
Pt's wife states that pt was smoking on the back porch when he passed out and "had a seizure".  Pt's wife states that his hand was shaking so she thought he was having a seizure.  Pt c/o chronic (greater than 3 years) chest and back pain.

## 2013-05-15 NOTE — ED Notes (Signed)
I have just phoned report to [place], Therapist, sports at [employer] at [facility].  Will have Dr. Mingo Amber fill out EMTALA and will phone [facility].

## 2013-05-15 NOTE — ED Notes (Signed)
He tells me that he has had exertional dyspnea "for a long time now every once-in-awhile".  He states he is having chest, abd. And low back discomfort.  He further tells me he had a syncopal episode during a coughing "fit".  He states he frequently gets "coughing fits because I smoke".

## 2013-05-16 DIAGNOSIS — R55 Syncope and collapse: Secondary | ICD-10-CM

## 2013-05-16 DIAGNOSIS — I10 Essential (primary) hypertension: Secondary | ICD-10-CM

## 2013-05-16 DIAGNOSIS — R079 Chest pain, unspecified: Secondary | ICD-10-CM | POA: Diagnosis not present

## 2013-05-16 DIAGNOSIS — J449 Chronic obstructive pulmonary disease, unspecified: Secondary | ICD-10-CM

## 2013-05-16 DIAGNOSIS — M722 Plantar fascial fibromatosis: Secondary | ICD-10-CM | POA: Diagnosis not present

## 2013-05-16 DIAGNOSIS — J9620 Acute and chronic respiratory failure, unspecified whether with hypoxia or hypercapnia: Secondary | ICD-10-CM | POA: Diagnosis not present

## 2013-05-16 LAB — TROPONIN I: Troponin I: <0.3 ng/mL (ref <0.30)

## 2013-05-16 NOTE — H&P (Signed)
INTERNAL MEDICINE TEACHING SERVICE Attending Admission Note  Date: 05/16/2013  Patient name: Vernon Carpenter  Medical record number: [id number]  Date of birth: [birth year]    I have seen and evaluated [name] and discussed their care with the Residency Team.  53 yr old man with hx COPD, tobacco abuse, hx intermittent CP, presented with syncope after a severe coughing episode. History does not suggest a primary seizure episode. He denies any CP, SOB, dizziness prior to episode. He reports he was able to walk without difficulty after the syncopal episode without confusion. No hx bowel or bladder incontinence.  He reports a hx of CP with exertion when cutting grass. He has been evaluated by cardiology in past with no further workup planned. This morning, he feels well. Cardiac exam with S1S2, no m/r/g, RRR. Lung exam CTA bilat. Neurological exam is intact without focal deficits. CT head without acute findings. He has ruled out for ACS. No evidence of events on telemetry. EKG with a short PR interval. I would ask cardiology to consider evaluating him today and would recommend an outpatient holter monitor. MPS on 10/2012 with no wall motion abn and a normal EF.  Educated on tobacco use and encouraged to quit. He is hemodynamically stable. I suspect vasovagal episode. Otherwise, medically stable for D/C with outpatient follow up.  Dominic Pea, DO, [facility] 05/16/2013, 10:43 AM

## 2013-05-16 NOTE — Discharge Instructions (Signed)
1. You have the following appointments scheduled:  Josue Hector, MD  On 06/10/2013 4:00 PM  [address] [place] [address] [phone]  Lesly Dukes On 05/22/2013 10:15 AM  [place] [address] [phone]  2. Please take all medications as prescribed. STOP SMOKING.  3. If you have worsening of your symptoms or new symptoms arise, please call the clinic ([phone]), or go to the ER immediately if symptoms are severe.  You have done a great job in taking all your medications. I appreciate it very much. Please continue doing that.   Syncope Syncope is a fainting spell. This means the person loses consciousness and drops to the ground. The person is generally unconscious for less than 5 minutes. The person may have some muscle twitches for up to 15 seconds before waking up and returning to normal. Syncope occurs more often in elderly people, but it can happen to anyone. While most causes of syncope are not dangerous, syncope can be a sign of a serious medical problem. It is important to seek medical care.  CAUSES  Syncope is caused by a sudden decrease in blood flow to the brain. The specific cause is often not determined. Factors that can trigger syncope include:  Taking medicines that lower blood pressure.  Sudden changes in posture, such as standing up suddenly.  Taking more medicine than prescribed.  Standing in one place for too long.  Seizure disorders.  Dehydration and excessive exposure to heat.  Low blood sugar (hypoglycemia).  Straining to have a bowel movement.  Heart disease, irregular heartbeat, or other circulatory problems.  Fear, emotional distress, seeing blood, or severe pain. SYMPTOMS  Right before fainting, you may:  Feel dizzy or lightheaded.  Feel nauseous.  See all white or all black in your field of vision.  Have cold, clammy skin. DIAGNOSIS  Your caregiver will ask about your symptoms, perform a  physical exam, and perform electrocardiography (ECG) to record the electrical activity of your heart. Your caregiver may also perform other heart or blood tests to determine the cause of your syncope. TREATMENT  In most cases, no treatment is needed. Depending on the cause of your syncope, your caregiver may recommend changing or stopping some of your medicines. HOME CARE INSTRUCTIONS  Have someone stay with you until you feel stable.  Do not drive, operate machinery, or play sports until your caregiver says it is okay.  Keep all follow-up appointments as directed by your caregiver.  Lie down right away if you start feeling like you might faint. Breathe deeply and steadily. Wait until all the symptoms have passed.  Drink enough fluids to keep your urine clear or pale yellow.  If you are taking blood pressure or heart medicine, get up slowly, taking several minutes to sit and then stand. This can reduce dizziness. SEEK IMMEDIATE MEDICAL CARE IF:   You have a severe headache.  You have unusual pain in the chest, abdomen, or back.  You are bleeding from the mouth or rectum, or you have black or tarry stool.  You have an irregular or very fast heartbeat.  You have pain with breathing.  You have repeated fainting or seizure-like jerking during an episode.  You faint when sitting or lying down.  You have confusion.  You have difficulty walking.  You have severe weakness.  You have vision problems. If you fainted, call your local emergency services (911 in [place] Do not drive yourself to the  hospital.  MAKE SURE YOU:  Understand these instructions.  Will watch your condition.  Will get help right away if you are not doing well or get worse. Document Released: 01/17/2005 Document Revised: 07/19/2011 Document Reviewed: 03/18/2011 [facility] Patient Information 2014 [place].

## 2013-05-16 NOTE — Progress Notes (Signed)
Subjective: Patient seen at bedside this AM. Says he is feeling well today. Denies any issues w/ dizziness, lightheadedness, or LOC. No chest pain or SOB.  Objective: Vital signs in last 24 hours: Filed Vitals:   05/15/13 2221 05/16/13 0235 05/16/13 0506 05/16/13 0913  BP: 133/79  105/68   Pulse: 77 76 71   Temp:   97.3 F (36.3 C)   TempSrc:   Oral   Resp: 18 16 20    Height:      Weight:   175 lb 9.3 oz (79.644 kg)   SpO2: 97% 98% 94% 96%   Weight change:   Intake/Output Summary (Last 24 hours) at 05/16/13 1205 Last data filed at 05/16/13 0900  Gross per 24 hour  Intake    360 ml  Output      0 ml  Net    360 ml   Physical Exam: General: Alert, cooperative, NAD. HEENT: PERRL, EOMI. Moist mucus membranes Neck: Full range of motion without pain, supple, no lymphadenopathy or carotid bruits Lungs: Clear to ascultation bilaterally, normal work of respiration, mild end expiratory wheezes, no rales, rhonchi Heart: RRR, no murmurs, gallops, or rubs Abdomen: Soft, non-tender, non-distended, BS + Extremities: No cyanosis, clubbing, or edema Neurologic: Alert & oriented X3, cranial nerves II-XII intact, strength grossly intact, sensation intact to light touch  Lab Results: Basic Metabolic Panel:  Recent Labs Lab 05/15/13 1411  NA 137  K 4.5  CL 98  CO2 25  GLUCOSE 135*  BUN 11  CREATININE 0.88  CALCIUM 9.6   CBC:  Recent Labs Lab 05/15/13 1411  WBC 8.2  HGB 15.0  HCT 44.4  MCV 93.3  PLT 231   Cardiac Enzymes:  Recent Labs Lab 05/16/13 0306 05/16/13 0830  TROPONINI <0.30 <0.30   D-Dimer:  Recent Labs Lab 05/15/13 1411  DDIMER 0.40   Studies/Results: Dg Chest 2 View  05/15/2013   CLINICAL DATA:  CHEST PAIN NEAR SYNCOPE BACK PAIN  EXAM: CHEST  2 VIEW  COMPARISON:  DG CHEST 2 VIEW dated 08/30/2012  FINDINGS: The heart size and mediastinal contours are within normal limits. Both lungs are clear. The visualized skeletal structures are unremarkable.   IMPRESSION: No active cardiopulmonary disease.   Electronically Signed   By: Margaree Mackintosh M.D.   On: 05/15/2013 15:38   Ct Head Wo Contrast  05/15/2013   CLINICAL DATA:  Sudden onset of syncope  EXAM: CT HEAD WITHOUT CONTRAST  TECHNIQUE: Contiguous axial images were obtained from the base of the skull through the vertex without intravenous contrast.  COMPARISON:  None.  FINDINGS: The ventricles are normal in size and position. There is no intracranial hemorrhage nor intracranial mass effect. There is no evidence of an evolving ischemic infarction. The cerebellum and brainstem are normal in density. There are no abnormal intracranial calcifications.  At bone window settings there is no evidence of an acute skull fracture.  IMPRESSION: There is no acute intracranial abnormality.   Electronically Signed   By: David  Martinique   On: 05/15/2013 16:12   Medications: I have reviewed the patient's current medications. Scheduled Meds: . albuterol  2.5 mg Nebulization Q6H  . aspirin EC  81 mg Oral Daily  . enoxaparin (LOVENOX) injection  40 mg Subcutaneous Q24H  . famotidine  20 mg Oral BID  . gabapentin  300 mg Oral TID  . lisinopril  20 mg Oral Daily  . nicotine  21 mg Transdermal Daily  . pneumococcal 23 valent vaccine  0.5 mL Intramuscular Tomorrow-1000  . simvastatin  10 mg Oral q1800  . sodium chloride  3 mL Intravenous Q12H   Continuous Infusions:  PRN Meds:.HYDROcodone-acetaminophen  Assessment/Plan: Mr. JULES BATY is a 53 y.o. male w/ PMHx of COPD, HTN, HLD, depression, and anxiety, admitted for an episode of syncope and collapse.  Syncope- Patient describes an episode of severe coughing while smoking a cigarette on the porch and "blacking out", falling backwards, and losing consciousness. He says he has never had something like this happen in the past, however, he does comment that he gets dizzy and lightheaded at times while urinating, having a bowel movement, or coughing. He claims he  does not recall any pre-syncopal symptoms prior to this event. Given his description, this is most likely 2/2 vaso-vagal syncope, however, cardiogenic syncope (though much less likely), is still a possibility. EKG this AM shows slightly shortened PR interval (114 msec), but otherwise, a very normal EKG. No events seen on telemetry overnight. Denies a h/o palpitations. Seizure very unlikely at this time. -Troponins x3 -ve -Patient has outpatient appointment w/ Dr. Johnsie Cancel for evaluation for Holter monitor.  -Stable for discharge.  Intermittent Chest Pain- Admits to exertional chest pain, specifically when mowing the lawn. Risk factors for CAD include smoking and HTN. Previous Myoview in 10/2012 shows no evidence of ischemia. EKG w/ no dynamic ST/T wave changes. Troponins -ve x3. -Outpatient cardiology follow up as above. -ASA 81  HTN- BP stable -Continnue Lisinopril   COPD- No prior PFTs. Takes albuterol inhaler. No evidence of exacerbation. Mild Wheezes on exam.  -Albuterol Nebs q6h  DVT/PE PPx- Lovenox  Dispo: Anticipated discharge today.  The patient does have a current PCP (Lesly Dukes, MD) and does need an [facility] hospital follow-up appointment after discharge.  The patient does not have transportation limitations that hinder transportation to clinic appointments.  .Services Needed at time of discharge: Y = Yes, Blank = No PT:   OT:   RN:   Equipment:   Other:     LOS: 1 day   Corky Sox, MD 05/16/2013, 12:05 PM

## 2013-05-20 NOTE — Discharge Summary (Signed)
Name: Vernon Carpenter MRN: [id number] DOB: 05/13/1960 53 y.o. PCP: Lesly Dukes, MD  Date of Admission: 05/15/2013  1:28 PM Date of Discharge: 05/16/13 Attending Physician: Murlean Caller  Discharge Diagnosis: 1. Syncope 2. Intermittent Chest pain  Discharge Medications:   Medication List         albuterol (2.5 MG/3ML) 0.083% nebulizer solution  Commonly known as:  PROVENTIL  Take 3 mLs (2.5 mg total) by nebulization every 6 (six) hours as needed for wheezing.     gabapentin 300 MG capsule  Commonly known as:  NEURONTIN  Take 300 mg by mouth 3 (three) times daily.     HYDROcodone-acetaminophen 10-325 MG per tablet  Commonly known as:  NORCO  Take 1 tablet by mouth every 6 (six) hours as needed.     lisinopril 20 MG tablet  Commonly known as:  PRINIVIL,ZESTRIL  Take 1 tablet (20 mg total) by mouth daily.     lovastatin 20 MG tablet  Commonly known as:  MEVACOR  Take 1 tablet (20 mg total) by mouth daily.     ranitidine 150 MG tablet  Commonly known as:  ZANTAC  Take 1 tablet (150 mg total) by mouth 2 (two) times daily.        Disposition and follow-up:   Vernon Carpenter was discharged from [facility] in Good condition.  At the hospital follow up visit please address:  1.  Syncope; Has the patient had any more episodes of syncope and collapse? Any dizziness or lightheadedness (pre-syncope)?  Patient to follow w/ Dr. Johnsie Cancel for evaluation for Holter Monitor (appt as below)  Chest pain; Patient complains of intermittent chest pain, mostly w/ exertion. Previous Myoview not significant for ischemia, however, patient has risk factors for CAD (HTN, smoking). Any recent episodes of chest pain? Associated symptoms? May need evaluation for cardiac catheterization as an outpatient.   2.  Labs / imaging needed at time of follow-up: none  3.  Pending labs/ test needing follow-up: none  Follow-up Appointments:     Follow-up Information   Follow up with Jenkins Rouge, MD On 06/10/2013. (4:00 PM)    Specialty:  Cardiology   Contact information:   [address] [place] [address] [phone]       Follow up with Lesly Dukes, MD On 05/22/2013. (10:15 AM)    Specialty:  Internal Medicine   Contact information:   [place] [address] [phone]       Discharge Instructions: Discharge Orders   Future Appointments Provider Department Dept Phone   05/22/2013 10:15 AM Lesly Dukes, MD [place] [phone]   05/30/2013 1:30 PM [employer] Counselor [place] [phone]   06/10/2013 4:00 PM Josue Hector, MD [place] Office [phone]   07/10/2013 2:15 PM Lesly Dukes, MD [place] [phone]   Future Orders Complete By Expires   Call MD for:  difficulty breathing, headache or visual disturbances  As directed    Call MD for:  extreme fatigue  As directed    Call MD for:  persistant dizziness or light-headedness  As directed       Consultations:  none  Procedures Performed:  Dg Chest 2 View  05/15/2013   CLINICAL DATA:  CHEST PAIN NEAR SYNCOPE BACK PAIN  EXAM: CHEST  2 VIEW  COMPARISON:  DG CHEST 2 VIEW dated 08/30/2012  FINDINGS: The heart size and mediastinal  contours are within normal limits. Both lungs are clear. The visualized skeletal structures are unremarkable.  IMPRESSION: No active cardiopulmonary disease.   Electronically Signed   By: Margaree Mackintosh M.D.   On: 05/15/2013 15:38   Ct Head Wo Contrast  05/15/2013   CLINICAL DATA:  Sudden onset of syncope  EXAM: CT HEAD WITHOUT CONTRAST  TECHNIQUE: Contiguous axial images were obtained from the base of the skull through the vertex without intravenous contrast.  COMPARISON:  None.  FINDINGS: The ventricles are normal in size and position. There is no intracranial hemorrhage nor intracranial mass effect. There is no evidence of an evolving  ischemic infarction. The cerebellum and brainstem are normal in density. There are no abnormal intracranial calcifications.  At bone window settings there is no evidence of an acute skull fracture.  IMPRESSION: There is no acute intracranial abnormality.   Electronically Signed   By: David  Martinique   On: 05/15/2013 16:12   Admission HPI: 53 year old gentleman, with past medical history of cigarette smoking, COPD, hypertension, intermittent chest pain, presents after an episode of syncope that happened at home around 11am on 05/15/2012.  Patient was with his wife on the porch smoking a cigarette when he started gagging and coughing; felt lightheaded and he passed out for a few seconds. His wife who was present during the entire event reported that he had jerking of his legs and hands for a few seconds. When he woke up he was able to walk back into the house with minimal assistance by his wife. He did not appear confused and he was feeling normal. However, EMS was called at the insistence of his wife due to concern of seizures and he was transported to [facility] ED. No preceding chest pain, shortness of breath, palpitations, diaphoresis, fevers, increased cough, chills or vomiting, however, the patient reports nausea. No fecal or urinary incontinence; and no tongue biting. No stroke symptoms reported by the patient at the time of our evaluation. Patient denies personal or family history of seizures. However, patient continues to report ongoing chronic back pain, and neck pain due to degenerative disc disease, for which he takes Norco. No recent change in his medications. He also reports that he had his regular breakfast that morning .  In terms of his intermittent chest pain, patient reports that he experiences chest pain, especially when he is cutting grass, which resolves with rest. Last October, he was evaluated for CAD- which included a Myoview and cardiac CT, which were both low risk. He has never had a cardiac  catheterization. He had outpatient followup visit with Dr Johnsie Cancel of cardiology, who did not recommend further medical therapy since the chest pain had largely resolved.  Hospital Course by problem list:   1. Syncope- Patient describes an episode of severe coughing while smoking a cigarette on the porch and "blacking out", falling backwards, and losing consciousness. He said he had never had something like that happen in the past, however, he does comment that he gets dizzy and lightheaded at times while urinating, having a bowel movement, or coughing. He claimed he did not recall any pre-syncopal symptoms prior to the event. Given his description, most likely 2/2 vaso-vagal syncope, however, cardiogenic syncope (though much less likely), is still a possibility. EKG showed slightly shortened PR interval (114 msec), but otherwise, a very normal EKG. Orthostatics negative on admission. No events seen on telemetry. Denied a h/o palpitations. Troponins negative x3. No episodes of syncope or presyncope during admission. Patient to  follow up w/ Dr. Johnsie Cancel for evaluation for a Holter Monitor.   2. Intermittent Chest pain- Admits to exertional chest pain, specifically when mowing the lawn. Risk factors for CAD include smoking and HTN. Previous Myoview in 10/2012 shows no evidence of ischemia. EKG w/ no dynamic ST/T wave changes, Troponins -ve x3. Continue ASA 81 mg on discharge, outpatient cardiology follow up.   Discharge Vitals:   BP 113/77  Pulse 79  Temp(Src) 98.7 F (37.1 C) (Oral)  Resp 16  Ht 5\' 4"  (1.626 m)  Wt 175 lb 9.3 oz (79.644 kg)  BMI 30.12 kg/m2  SpO2 98%  Discharge Labs:  No results found for this or any previous visit (from the past 24 hour(s)).  Signed: Corky Sox, MD 05/20/2013, 1:49 PM   Time Spent on Discharge: 35 minutes Services Ordered on Discharge: none Equipment Ordered on Discharge: none

## 2013-05-22 ENCOUNTER — Encounter: Payer: Self-pay | Admitting: Internal Medicine

## 2013-05-22 ENCOUNTER — Ambulatory Visit (INDEPENDENT_AMBULATORY_CARE_PROVIDER_SITE_OTHER): Payer: No Typology Code available for payment source | Admitting: Internal Medicine

## 2013-05-22 VITALS — BP 111/74 | HR 78 | Temp 97.9°F | Ht 64.0 in | Wt 175.4 lb

## 2013-05-22 DIAGNOSIS — I10 Essential (primary) hypertension: Secondary | ICD-10-CM

## 2013-05-22 DIAGNOSIS — R55 Syncope and collapse: Secondary | ICD-10-CM

## 2013-05-22 DIAGNOSIS — F17200 Nicotine dependence, unspecified, uncomplicated: Secondary | ICD-10-CM

## 2013-05-22 DIAGNOSIS — Z720 Tobacco use: Secondary | ICD-10-CM

## 2013-05-22 MED ORDER — NICOTINE 14 MG/24HR TD PT24: 14.0000 mg | MEDICATED_PATCH | TRANSDERMAL | Status: DC

## 2013-05-22 MED ORDER — LISINOPRIL 10 MG PO TABS: 10.0000 mg | ORAL_TABLET | Freq: Every day | ORAL | Status: DC

## 2013-05-22 NOTE — Assessment & Plan Note (Addendum)
Patient is now motivated to quit smoking. He has cut down to just a few cigarettes per day. He requests a prescription for NicoDerm patches as these helped his cravings in the hospital. - NicoDerm patches 14mg Margo Aye ordered - Patient commended on his efforts to quit smoking

## 2013-05-22 NOTE — Patient Instructions (Addendum)
Thank you for your visit. - Please decrease your lisinopril dose to 10 mg daily. This is half of your current dose, so you may take 1/2 a pill until your next refill. - I commend you on your efforts to quit smoking. I have ordered you NicoDerm patches, they were sent electronically to your pharmacy. - Please follow up with Dr. Johnsie Cancel as scheduled. - Please let us know if you develop severe dizziness, fever, chills, increased urination, diarrhea or vomiting.

## 2013-05-22 NOTE — Progress Notes (Signed)
INTERNAL MEDICINE TEACHING ATTENDING ADDENDUM - Alexandera Kuntzman, MD: I reviewed and discussed at the time of visit with the resident Dr. Cater, the patient's medical history, physical examination, diagnosis and results of tests and treatment and I agree with the patient's care as documented. 

## 2013-05-22 NOTE — Assessment & Plan Note (Addendum)
No further episodes of syncope at home since hospital discharge, but he continues to endorse mild lightheadedness. He denies other symptoms including fevers, chills, decreased oral intake, polyuria, polydipsia, weight change, diarrhea, vomiting, weakness, dizziness. Blood pressure soft today, 95/66. Orthostatics were negative. We may be over medicating him with lisinopril. - Decrease lisinopril dose to 10 mg daily (1/2 current dose) - Followup with Dr. Johnsie Cancel on 06/10/13 for possible Holter monitor

## 2013-05-22 NOTE — Progress Notes (Signed)
Subjective:    Patient ID: Vernon Carpenter, male    DOB: 1960/02/19, 53 y.o.   MRN: [id number]  HPI  Vernon Carpenter is a 53 y.o.man with PMH significant for asthma, HTN, and cervical disc disease with chronic neck pain who presents for hospital follow up.  Patient was admitted to the hospital on 05/15/2013 for an episode of syncope. EKG was within normal limits. Orthostatics were negative. No events seen on telemetry. Troponins were trended and negative x3. They felt the most likely etiology was vasovagal syncope. Patient was instructed to followup with Dr. Johnsie Cancel in for evaluation for a Holter monitor to rule out arrhythmia as a cause.  Blood pressure today is 95/66. He takes lisinopril 20 mg daily. Patient reports he still feels a little bit lightheaded. It is constant, not associated with activity or any of his medicines. He has been eating and drinking normally. No weight changes. No polyuria. No fevers or chills. No nausea, vomiting, diarrhea.  He is trying to quit smoking and asks me for a prescription for patches. These really helped him in the hospital.   Current Outpatient Prescriptions on File Prior to Visit  Medication Sig Dispense Refill  . albuterol (PROVENTIL) (2.5 MG/3ML) 0.083% nebulizer solution Take 3 mLs (2.5 mg total) by nebulization every 6 (six) hours as needed for wheezing.  75 mL  12  . gabapentin (NEURONTIN) 300 MG capsule Take 300 mg by mouth 3 (three) times daily.      Marland Kitchen HYDROcodone-acetaminophen (NORCO) 10-325 MG per tablet Take 1 tablet by mouth every 6 (six) hours as needed.  120 tablet  0  . lisinopril (PRINIVIL,ZESTRIL) 20 MG tablet Take 1 tablet (20 mg total) by mouth daily.  30 tablet  11  . lovastatin (MEVACOR) 20 MG tablet Take 1 tablet (20 mg total) by mouth daily.  30 tablet  11  . ranitidine (ZANTAC) 150 MG tablet Take 1 tablet (150 mg total) by mouth 2 (two) times daily.  60 tablet  11    Review of Systems  Constitutional: Negative for fever,  chills and unexpected weight change.  Respiratory: Negative for cough and shortness of breath.   Cardiovascular: Negative for chest pain.  Gastrointestinal: Negative for nausea, vomiting, abdominal pain and diarrhea.  Endocrine: Negative for polyuria.  Genitourinary: Negative for dysuria.  Neurological: Positive for light-headedness. Negative for dizziness, syncope, weakness and numbness.      Objective:   Physical Exam  Constitutional: He is oriented to person, place, and time. He appears well-developed and well-nourished. No distress.  HENT:  Head: Normocephalic and atraumatic.  Eyes: Conjunctivae and EOM are normal. Pupils are equal, round, and reactive to light.  Neck: Normal range of motion. Neck supple.  Cardiovascular: Normal rate, regular rhythm and normal heart sounds.  Exam reveals no gallop and no friction rub.   No murmur heard. Pulmonary/Chest: Effort normal. No respiratory distress. He has wheezes (Mild inspiratory wheezes). He has no rales. He exhibits no tenderness.  Abdominal: Soft. Bowel sounds are normal. He exhibits no distension. There is no tenderness.  Musculoskeletal: Normal range of motion. He exhibits no edema and no tenderness.  Neurological: He is alert and oriented to person, place, and time. No cranial nerve deficit.  Skin: Skin is warm and dry.  Psychiatric: He has a normal mood and affect.    Filed Vitals:   05/22/13 1108  BP: 98/71  Pulse: 80  Temp:     Orthostatic vital signs: Lying - 108/72, HR  73 Sitting - 98/71, HR 80 Standing - 111/74, HR 78    Assessment & Plan:   Please see problem-based charting.

## 2013-05-22 NOTE — Assessment & Plan Note (Addendum)
BP Readings from Last 3 Encounters:  05/22/13 111/74  05/16/13 113/77  04/11/13 108/84    Lab Results  Component Value Date   NA 137 05/15/2013   K 4.5 05/15/2013   CREATININE 0.88 05/15/2013    Assessment: Blood pressure control:  Good Progress toward BP goal:   At goal Comments: Soft blood pressures noted here, 90s/60s-70s, he is not orthostatic  Plan: Medications:  Reduce lisinopril dose to 10 mg daily Other plans: Routine followup

## 2013-05-22 NOTE — Discharge Summary (Signed)
  Date: 05/22/2013  Patient name: Vernon Carpenter  Medical record number: [id number]  Date of birth: 1960/08/07   This patient has been seen and the plan of care was discussed with the house staff. Please see their note for complete details. I concur with their findings and plan.  Dominic Pea, DO, [facility] 05/22/2013, 2:43 PM

## 2013-05-30 ENCOUNTER — Ambulatory Visit: Payer: Medicaid Other

## 2013-06-10 ENCOUNTER — Ambulatory Visit (INDEPENDENT_AMBULATORY_CARE_PROVIDER_SITE_OTHER): Payer: No Typology Code available for payment source | Admitting: Cardiovascular Disease

## 2013-06-10 ENCOUNTER — Encounter: Payer: Self-pay | Admitting: Cardiovascular Disease

## 2013-06-10 VITALS — BP 102/72 | HR 83 | Ht 64.0 in | Wt 174.0 lb

## 2013-06-10 DIAGNOSIS — J4489 Other specified chronic obstructive pulmonary disease: Secondary | ICD-10-CM

## 2013-06-10 DIAGNOSIS — J45909 Unspecified asthma, uncomplicated: Secondary | ICD-10-CM

## 2013-06-10 DIAGNOSIS — J449 Chronic obstructive pulmonary disease, unspecified: Secondary | ICD-10-CM

## 2013-06-10 DIAGNOSIS — R55 Syncope and collapse: Secondary | ICD-10-CM

## 2013-06-10 NOTE — Assessment & Plan Note (Signed)
Doubt cardiac etiology with previously normal CT and myovue.  ECG is essentially normal  Will order dobutamine echo to r/o CAD  He cannot walk on treadmill due to COPD/Asthma  No need for monitor as he has no palpitations or symptoms to activate it and telemetry in hospital  normal

## 2013-06-10 NOTE — Patient Instructions (Addendum)
Your physician recommends that you schedule a follow-up appointment in: AS NEEDED  Your physician recommends that you continue on your current medications as directed. Please refer to the Current Medication list given to you today. Your physician has recommended that you have a pulmonary function test. Pulmonary Function Tests are a group of tests that measure how well air moves in and out of your lungs.  You have been referred to  PULMONARY     Your physician has requested that you have a dobutamine echocardiogram. For further information please visit [email or web address]. Please follow instruction sheet as given.

## 2013-06-10 NOTE — Progress Notes (Signed)
Patient ID: Vernon Carpenter, male   DOB: [birth year], 53 y.o.   MRN: [id number]   Vernon Carpenter is a 53 y.o.man with PMH significant for asthma, HTN, and cervical disc disease with chronic neck pain who presents for hospital follow up.  Patient was admitted to the hospital on 05/15/2013 for an episode of syncope. This was in the setting of severe coughing episode and ultimately thought to be Vasovagal  EKG was within normal limits. Orthostatics were negative. No events seen on telemetry. Troponins were trended and negative x3. They felt the most likely etiology was vasovagal syncope. Patient was instructed to followup with Dr. Johnsie Cancel in for evaluation for a Holter monitor to rule out arrhythmia as a cause.  Blood pressure today is low at his primaries office visit recently and lisinopril decreased to 10 mg  Patient reports he still feels a little bit lightheaded. It is constant, not associated with activity or any of his medicines. He has been eating and drinking normally. No weight changes. No polyuria. No fevers or chills. No nausea, vomiting, diarrhea.  Last seen in 2014  Has no known CAD  In 2012 had calcium score of 0 and no CAD  In 2014 had normal myovue except for diaphragmatic attenuation and EF 60%  He is unemployed He has a chronic bad cough with ? Reflux especially when is partials are not in  Has a nebulizer at home.  Cannot afford nicotine patches No chest pain He does not remember events surrounding hospitalization     ROS: Denies fever, malais, weight loss, blurry vision, decreased visual acuity, cough, sputum, SOB, hemoptysis, pleuritic pain, palpitaitons, heartburn, abdominal pain, melena, lower extremity edema, claudication, or rash.  All other systems reviewed and negative  General: Affect appropriate Chronically ill white male  HEENT: poor dentition  Neck supple with no adenopathy JVP normal no bruits no thyromegaly Lungs Diffuse rhonchi and wheezing  With increased dead  space/ air trapping and good diaphragmatic motion Heart:  S1/S2 no murmur, no rub, gallop or click PMI normal Abdomen: benighn, BS positve, no tenderness, no AAA no bruit.  No HSM or HJR Distal pulses intact with no bruits No edema Neuro non-focal Skin warm and dry No muscular weakness   Current Outpatient Prescriptions  Medication Sig Dispense Refill  . albuterol (PROVENTIL) (2.5 MG/3ML) 0.083% nebulizer solution Take 3 mLs (2.5 mg total) by nebulization every 6 (six) hours as needed for wheezing.  75 mL  12  . aspirin 81 MG tablet Take 81 mg by mouth daily.      Marland Kitchen gabapentin (NEURONTIN) 300 MG capsule Take 300 mg by mouth 3 (three) times daily.      Marland Kitchen HYDROcodone-acetaminophen (NORCO) 10-325 MG per tablet Take 1 tablet by mouth every 6 (six) hours as needed.  120 tablet  0  . lisinopril (PRINIVIL,ZESTRIL) 10 MG tablet Take 1 tablet (10 mg total) by mouth daily.  30 tablet  11  . lovastatin (MEVACOR) 20 MG tablet Take 1 tablet (20 mg total) by mouth daily.  30 tablet  11  . nicotine (NICODERM CQ - DOSED IN MG/24 HOURS) 14 mg/24hr patch Place 1 patch (14 mg total) onto the skin daily.  30 patch  11  . ranitidine (ZANTAC) 150 MG tablet Take 1 tablet (150 mg total) by mouth 2 (two) times daily.  60 tablet  11   No current facility-administered medications for this visit.    Allergies  Review of patient's allergies indicates no known allergies.  Electrocardiogram:  SR rate 81 PR short 106  QT normal 357  Normal ST segments   Assessment and Plan

## 2013-06-10 NOTE — Assessment & Plan Note (Signed)
Well controlled.  Continue current medications and low sodium Dash type diet.    

## 2013-06-10 NOTE — Assessment & Plan Note (Signed)
This is his biggest issue  Last PFTls 2013 Moderate airflow obstruction with bronchodilator response  Clinically he has hyperinflation and lots of dead space  Repeat PFTls and refer to pulmonary Counseled regarding smoking cessation but cannot afford nicotine replacement

## 2013-06-13 ENCOUNTER — Ambulatory Visit (INDEPENDENT_AMBULATORY_CARE_PROVIDER_SITE_OTHER): Payer: No Typology Code available for payment source | Admitting: Internal Medicine

## 2013-06-13 DIAGNOSIS — J45909 Unspecified asthma, uncomplicated: Secondary | ICD-10-CM

## 2013-06-13 LAB — PULMONARY FUNCTION TEST
DL/VA % pred: 103 %
DL/VA: 4.2 ml/min/mmHg/L
DLCO UNC % PRED: 107 %
DLCO unc: 23.99 ml/min/mmHg
FEF 25-75 POST: 2.27 L/s
FEF 25-75 Pre: 1.31 L/sec
FEF2575-%CHANGE-POST: 74 %
FEF2575-%Pred-Post: 84 %
FEF2575-%Pred-Pre: 48 %
FEV1-%CHANGE-POST: 16 %
FEV1-%PRED-POST: 79 %
FEV1-%PRED-PRE: 68 %
FEV1-POST: 2.33 L
FEV1-PRE: 2.01 L
FEV1FVC-%Change-Post: -2 %
FEV1FVC-%PRED-PRE: 92 %
FEV6-%Change-Post: 16 %
FEV6-%Pred-Post: 89 %
FEV6-%Pred-Pre: 76 %
FEV6-Post: 3.25 L
FEV6-Pre: 2.79 L
FEV6FVC-%CHANGE-POST: 0 %
FEV6FVC-%PRED-PRE: 103 %
FEV6FVC-%Pred-Post: 103 %
FVC-%Change-Post: 18 %
FVC-%Pred-Post: 88 %
FVC-%Pred-Pre: 74 %
FVC-Post: 3.34 L
FVC-Pre: 2.82 L
POST FEV1/FVC RATIO: 70 %
PRE FEV6/FVC RATIO: 99 %
Post FEV6/FVC ratio: 99 %
Pre FEV1/FVC ratio: 71 %
RV % pred: 150 %
RV: 2.51 L
TLC % pred: 114 %
TLC: 6.25 L

## 2013-06-13 NOTE — Progress Notes (Signed)
PFT done today. 

## 2013-06-19 ENCOUNTER — Telehealth: Payer: Self-pay | Admitting: Cardiovascular Disease

## 2013-06-19 DIAGNOSIS — J449 Chronic obstructive pulmonary disease, unspecified: Secondary | ICD-10-CM

## 2013-06-19 NOTE — Telephone Encounter (Signed)
New message ° ° ° ° °Returning Vernon Carpenter's call °

## 2013-06-20 NOTE — Telephone Encounter (Signed)
Message copied by Richmond Campbell on Thu Jun 20, 2013  8:54 AM ------      Message from: Josue Hector      Created: Sun Jun 16, 2013  9:10 PM       Moderate COPD with asthmatic component DLCO normal  Should have pulmonary f/u      ----- Message -----         From: Transcription Incoming Interface         Sent: 06/13/2013   8:03 PM           To: Josue Hector, MD                   ------

## 2013-06-20 NOTE — Telephone Encounter (Signed)
PT  AWARE OF   PFT  RESULTS   WILL FORWARD  MESSAGE TO [place]  TO  SCHEDULE PULMONARY APPT .Adonis Housekeeper

## 2013-06-26 ENCOUNTER — Telehealth: Payer: Self-pay | Admitting: Internal Medicine

## 2013-06-26 ENCOUNTER — Other Ambulatory Visit (HOSPITAL_COMMUNITY): Payer: Medicaid Other | Admitting: *Deleted

## 2013-06-26 ENCOUNTER — Ambulatory Visit (INDEPENDENT_AMBULATORY_CARE_PROVIDER_SITE_OTHER): Payer: No Typology Code available for payment source | Admitting: Internal Medicine

## 2013-06-26 ENCOUNTER — Ambulatory Visit (HOSPITAL_COMMUNITY): Payer: Medicaid Other | Attending: Cardiovascular Disease | Admitting: Radiology

## 2013-06-26 ENCOUNTER — Encounter: Payer: Self-pay | Admitting: Internal Medicine

## 2013-06-26 VITALS — BP 142/82 | HR 91 | Ht 64.0 in | Wt 176.2 lb

## 2013-06-26 DIAGNOSIS — I10 Essential (primary) hypertension: Secondary | ICD-10-CM

## 2013-06-26 DIAGNOSIS — F17200 Nicotine dependence, unspecified, uncomplicated: Secondary | ICD-10-CM | POA: Insufficient documentation

## 2013-06-26 DIAGNOSIS — R55 Syncope and collapse: Secondary | ICD-10-CM | POA: Diagnosis present

## 2013-06-26 DIAGNOSIS — J449 Chronic obstructive pulmonary disease, unspecified: Secondary | ICD-10-CM

## 2013-06-26 DIAGNOSIS — R079 Chest pain, unspecified: Secondary | ICD-10-CM

## 2013-06-26 DIAGNOSIS — Z720 Tobacco use: Secondary | ICD-10-CM

## 2013-06-26 MED ORDER — LOSARTAN POTASSIUM 25 MG PO TABS
25.0000 mg | ORAL_TABLET | Freq: Every day | ORAL | Status: DC
Start: 2013-06-26 — End: 2013-10-11

## 2013-06-26 MED ORDER — DOBUTAMINE INFUSION FOR EP/ECHO/NUC (1000 MCG/ML)
0.0000 ug/kg/min | Freq: Once | INTRAVENOUS | Status: AC
  Administered 2013-06-26: 40 ug/kg/min via INTRAVENOUS

## 2013-06-26 MED ORDER — PREDNISONE 10 MG PO TABS: ORAL_TABLET | ORAL | Status: DC

## 2013-06-26 NOTE — Assessment & Plan Note (Signed)
Change ace inhibitor to arb due to cough

## 2013-06-26 NOTE — Progress Notes (Signed)
Subjective:    Patient ID: Vernon Carpenter, male    DOB: 1960/05/08, 53 y.o.   MRN: [id number] [facility] Lesly Dukes, MD REferred by Dr Johnsie Cancel of cards HPI   IOV 06/26/2013  Chief Complaint  Patient presents with  . Pulmonary Consult    Referred by Dr. Johnsie Cancel for COPD.   53 year old male. VEry poor historian REports   Chronic cough: x several years. Insidious onset. Progressive. Moderate to severe in intensity. No clear cut aggravating factors. Improved by nebs prn and lying flat. Quality is dry cough. It is associated with ACE inhibitor intake and an asthma diagnosis and smoking hx.  There is associted dyspnea  Dyspnea: insidious onset x few years. Progressive. Exertional. Brought on with exertion of walking hallways. RElieved by rest but also present at rest.   Labs   Admitted 05/15/13 with syncope: cardiac issues ruled out including troponin and holter  fElt to be cardiac syncope and referred here  Pulmonary function test 06/13/2013 shows post bronchodilator FEV1 at 2.33 L/79%. FVC of 2.3 L/88% [PRe bronchodilator FEV1 was 2 L/68%] . THere was significant BD response of approx 16%. Post bd  Ratio 70. Total lung capacity 6.25 L 114% predicted and DLCO 24/107% predicted  CXR 05/15/13 - clear  CBC - hgb 15gm% 05/15/13   Has no known CAD In 2012 had calcium score of 0 and no CAD In 2014 had normal myovue except for diaphragmatic attenuation and EF 60%    reports that he has been smoking Cigarettes.  He has a  Age 39 but quit for 3 years in interim. Currently smoking. Always smoked < 1/2 ppd. Total smoking 17ppd or more   Past Medical History  Diagnosis Date  . Acute and chronic respiratory failure   . CHEST PAIN     12/2011 seen by Dr. Johnsie Cancel and cleared for hernia repair surgery  . Plantar fasciitis of left foot   . Neck pain   . Cough   . Umbilical hernia   . COPD (chronic obstructive pulmonary disease)   . Asthma   . Hypertension   . Depression   . GERD  (gastroesophageal reflux disease)   . High cholesterol   . Arthritis     "back" (05/15/2013)  . Chronic lower back pain   . Anxiety      Family History  Problem Relation Age of Onset  . Heart disease Father   . Diabetes Father   . Hypertension Father   . Diabetes Mother   . Hypertension Mother   . Colon cancer Neg Hx      History   Social History  . Marital Status: Married    Spouse Name: N/A    Number of Children: N/A  . Years of Education: N/A   Occupational History  . Unemployed    Social History Main Topics  . Smoking status: Current Every Day Smoker -- 0.50 packs/day for 30 years    Types: Cigarettes  . Smokeless tobacco: Never Used     Comment: Cutting back.  . Alcohol Use: No  . Drug Use: No  . Sexual Activity: Not on file   Other Topics Concern  . Not on file   Social History Narrative  . No narrative on file     No Known Allergies   Outpatient Prescriptions Prior to Visit  Medication Sig Dispense Refill  . albuterol (PROVENTIL) (2.5 MG/3ML) 0.083% nebulizer solution Take 3 mLs (2.5 mg total) by nebulization every 6 (six)  hours as needed for wheezing.  75 mL  12  . aspirin 81 MG tablet Take 81 mg by mouth daily.      Marland Kitchen gabapentin (NEURONTIN) 300 MG capsule Take 300 mg by mouth 3 (three) times daily.      Marland Kitchen HYDROcodone-acetaminophen (NORCO) 10-325 MG per tablet Take 1 tablet by mouth every 6 (six) hours as needed.  120 tablet  0  . lisinopril (PRINIVIL,ZESTRIL) 10 MG tablet Take 1 tablet (10 mg total) by mouth daily.  30 tablet  11  . lovastatin (MEVACOR) 20 MG tablet Take 1 tablet (20 mg total) by mouth daily.  30 tablet  11  . nicotine (NICODERM CQ - DOSED IN MG/24 HOURS) 14 mg/24hr patch Place 1 patch (14 mg total) onto the skin daily.  30 patch  11  . ranitidine (ZANTAC) 150 MG tablet Take 1 tablet (150 mg total) by mouth 2 (two) times daily.  60 tablet  11   No facility-administered medications prior to visit.       Review of Systems    Constitutional: Negative for fever and unexpected weight change.  HENT: Positive for sinus pressure. Negative for congestion, dental problem, ear pain, nosebleeds, postnasal drip, rhinorrhea, sneezing, sore throat and trouble swallowing.   Eyes: Negative for redness and itching.  Respiratory: Positive for cough, chest tightness and shortness of breath. Negative for wheezing.   Cardiovascular: Positive for chest pain. Negative for palpitations and leg swelling.  Gastrointestinal: Negative for nausea and vomiting.  Genitourinary: Negative for dysuria.  Musculoskeletal: Negative for joint swelling.  Skin: Negative for rash.  Neurological: Positive for headaches.  Hematological: Does not bruise/bleed easily.  Psychiatric/Behavioral: Negative for dysphoric mood. The patient is not nervous/anxious.        Objective:   Physical Exam  Nursing note and vitals reviewed. Constitutional: He is oriented to person, place, and time. He appears well-developed and well-nourished. No distress.  HENT:  Head: Normocephalic and atraumatic.  Right Ear: External ear normal.  Left Ear: External ear normal.  Mouth/Throat: Oropharynx is clear and moist. No oropharyngeal exudate.  Eyes: Conjunctivae and EOM are normal. Pupils are equal, round, and reactive to light. Right eye exhibits no discharge. Left eye exhibits no discharge. No scleral icterus.  Neck: Normal range of motion. Neck supple. No JVD present. No tracheal deviation present. No thyromegaly present.  Cardiovascular: Normal rate, regular rhythm and intact distal pulses.  Exam reveals no gallop and no friction rub.   No murmur heard. Pulmonary/Chest: Effort normal. No respiratory distress. He has wheezes. He has no rales. He exhibits no tenderness.  Scattered wheeze +  Abdominal: Soft. Bowel sounds are normal. He exhibits no distension and no mass. There is no tenderness. There is no rebound and no guarding.  Musculoskeletal: Normal range of motion.  He exhibits no edema and no tenderness.  Lymphadenopathy:    He has no cervical adenopathy.  Neurological: He is alert and oriented to person, place, and time. He has normal reflexes. No cranial nerve deficit. Coordination normal.  Skin: Skin is warm and dry. No rash noted. He is not diaphoretic. No erythema. No pallor.  Psychiatric: He has a normal mood and affect. His behavior is normal. Judgment and thought content normal.     Filed Vitals:   06/26/13 0926  BP: 142/82  Pulse: 91  Height: 5\' 4"  (1.626 m)  Weight: 176 lb 3.2 oz (79.924 kg)  SpO2: 97%         Assessment & Plan:  #  Obstructive lung disease  - asthma like disease; you likely in flare up now due to lack of regular meds  - need to quit smoking  - you need to get insurance soon - STOP LISINOPRIL; makes cough and asthma worse - Instead start losartan  25mg  once daily  - Take prednisone 40 mg daily x 2 days, then 20mg  daily x 2 days, then 10mg  daily x 2 days, then 5mg  daily x 2 days and stop  - start dulera 100/5, 2 puff twice daily - learn technique,   #SMoking  you need to quit asap  #Followup  4 weeks with my NP Consider spirometry at followup

## 2013-06-26 NOTE — Telephone Encounter (Signed)
Rx has been sent in per MR's OV note. Pt is aware. Nothing further is needed.

## 2013-06-26 NOTE — Patient Instructions (Addendum)
#  Obstructive lung disease  - asthma like disease; you likely in flare up now due to lack of regular meds  - need to quit smoking  - you need to get insurance soon - STOP LISINOPRIL; makes cough and asthma worse - Instead start losartan  25mg  once daily  - Take prednisone 40 mg daily x 2 days, then 20mg  daily x 2 days, then 10mg  daily x 2 days, then 5mg  daily x 2 days and stop  - start dulera 100/5, 2 puff twice daily - learn technique,   #SMoking  you need to quit asap  #Followup  4 weeks with my NP Consider spirometry at followup

## 2013-06-26 NOTE — Progress Notes (Signed)
Dobutamine stress echo was performed.

## 2013-06-26 NOTE — Assessment & Plan Note (Signed)
Needs to quit. He knows. Will counsel at future visit

## 2013-07-04 ENCOUNTER — Telehealth: Payer: Self-pay | Admitting: Cardiovascular Disease

## 2013-07-04 NOTE — Telephone Encounter (Signed)
New message     Pt is returning Christine's call

## 2013-07-04 NOTE — Telephone Encounter (Signed)
PT AWARE OF  DOBUTAMINE  ECHO  RESULTS .Adonis Housekeeper

## 2013-07-10 ENCOUNTER — Encounter: Payer: Self-pay | Admitting: Internal Medicine

## 2013-07-10 ENCOUNTER — Ambulatory Visit (INDEPENDENT_AMBULATORY_CARE_PROVIDER_SITE_OTHER): Payer: No Typology Code available for payment source | Admitting: Internal Medicine

## 2013-07-10 VITALS — BP 112/72 | HR 81 | Temp 98.2°F | Ht 64.0 in | Wt 176.9 lb

## 2013-07-10 DIAGNOSIS — M542 Cervicalgia: Secondary | ICD-10-CM

## 2013-07-10 DIAGNOSIS — F17200 Nicotine dependence, unspecified, uncomplicated: Secondary | ICD-10-CM

## 2013-07-10 DIAGNOSIS — J449 Chronic obstructive pulmonary disease, unspecified: Secondary | ICD-10-CM

## 2013-07-10 DIAGNOSIS — M5090 Cervical disc disorder, unspecified, unspecified cervical region: Secondary | ICD-10-CM

## 2013-07-10 DIAGNOSIS — I10 Essential (primary) hypertension: Secondary | ICD-10-CM

## 2013-07-10 DIAGNOSIS — Z720 Tobacco use: Secondary | ICD-10-CM

## 2013-07-10 MED ORDER — HYDROCODONE-ACETAMINOPHEN 10-325 MG PO TABS: 1.0000 | ORAL_TABLET | Freq: Four times a day (QID) | ORAL | Status: DC | PRN

## 2013-07-10 NOTE — Patient Instructions (Signed)
Thank you for your visit. - I have refilled your Norco x3 months. Please remember that we cannot replace these prescriptions if lost. - If you develop sudden numbness or weakness in your legs, or loss of bowel or bladder control, please call the clinic as this can be a sign of spinal cord issues. - Please continue to work towards quitting smoking. - Followup in 3 months.

## 2013-07-10 NOTE — Progress Notes (Signed)
Subjective:    Patient ID: Vernon Carpenter, male    DOB: 1960/04/17, 53 y.o.   MRN: [id number]  HPI  Vernon Carpenter is a 53 y.o.man with PMH significant for asthma, HTN, and cervical disc disease with chronic neck pain who presents for routine follow up.  HTN - Lisinopril was changed to losartan 25 mg daily by his pulmonologist due to cough. Stable today.  Obstructive lung disease - Saw Dr. Chase Caller with pulmonology on 06/26/2013, who prescribed Dulera and a prednisone taper and recommended smoking cessation. Patient has not had any trouble affording his new medications. Denies significant shortness of breath.  Chronic pain - Stable, in cervical spine and low back. He needs refills of his Norco starting 07/31/13.  Chronic chest pain - Dr. Johnsie Cancel with Cardiology has been following him. Seen most recently on 06/10/13. Dobutamine stress test was performed on 06/26/2013, it was nonischemic. A nuclear stress test was performed on 11/26/13, low risk. He has also has had a normal cardiac CT in the past.    Current Outpatient Prescriptions on File Prior to Visit  Medication Sig Dispense Refill  . albuterol (PROVENTIL) (2.5 MG/3ML) 0.083% nebulizer solution Take 3 mLs (2.5 mg total) by nebulization every 6 (six) hours as needed for wheezing.  75 mL  12  . aspirin 81 MG tablet Take 81 mg by mouth daily.      Marland Kitchen gabapentin (NEURONTIN) 300 MG capsule Take 300 mg by mouth 3 (three) times daily.      Marland Kitchen HYDROcodone-acetaminophen (NORCO) 10-325 MG per tablet Take 1 tablet by mouth every 6 (six) hours as needed.  120 tablet  0  . losartan (COZAAR) 25 MG tablet Take 1 tablet (25 mg total) by mouth daily.  30 tablet  5  . lovastatin (MEVACOR) 20 MG tablet Take 1 tablet (20 mg total) by mouth daily.  30 tablet  11  . nicotine (NICODERM CQ - DOSED IN MG/24 HOURS) 14 mg/24hr patch Place 1 patch (14 mg total) onto the skin daily.  30 patch  11  . predniSONE (DELTASONE) 10 MG tablet 40 mg daily x 2 days, then  20mg  daily x 2 days, then 10mg  daily x 2 days, then 5mg  daily x 2 days and stop  15 tablet  0  . ranitidine (ZANTAC) 150 MG tablet Take 1 tablet (150 mg total) by mouth 2 (two) times daily.  60 tablet  11    Review of Systems  Constitutional: Negative for fever.  Respiratory: Negative for shortness of breath.   Cardiovascular: Negative for chest pain.  Musculoskeletal: Positive for back pain and neck pain. Negative for gait problem.  Neurological: Negative for dizziness, weakness and numbness.       Objective:   Physical Exam  Constitutional: He is oriented to person, place, and time. He appears well-developed and well-nourished.  HENT:  Head: Normocephalic and atraumatic.  Eyes: Conjunctivae and EOM are normal. Pupils are equal, round, and reactive to light.  Neck: Normal range of motion. Neck supple.  Cardiovascular: Normal rate and regular rhythm.   Pulmonary/Chest: Effort normal and breath sounds normal.  Musculoskeletal: Normal range of motion. He exhibits no edema and no tenderness.  Neurological: He is alert and oriented to person, place, and time. No cranial nerve deficit.  Skin: Skin is warm and dry.  Psychiatric: He has a normal mood and affect.     Filed Vitals:   07/10/13 1413  BP: 112/72  Pulse: 81  Temp: 98.2 F (  36.8 C)       Assessment & Plan:   Please see problem based charting.

## 2013-07-10 NOTE — Assessment & Plan Note (Signed)
Using patches which help the cravings. Still smoking 1/2 PPD. - Counseled the patient again to quit smoking

## 2013-07-10 NOTE — Assessment & Plan Note (Signed)
Repeat PFTs on 06/13/2013 showed moderate airway obstruction and a response to bronchodilators, indicating asthma. Saw Dr. Chase Caller with pulmonology on 06/26/2013, who prescribed Dulera and a prednisone taper and recommended smoking cessation. Patient has not had any trouble affording his new medications. - Continue Dulera, albuterol nebulizer when necessary

## 2013-07-10 NOTE — Assessment & Plan Note (Signed)
Stable. Has seen Dr. Micheline Chapman in the past (07/14) who felt the patient was not a candidate for [facility] and should be managed symptomatically with limited PT and pain medication.  - Refilled Norco 10-325 #120/month for his back and neck pain - Paper scripts written for fill dates of: 07/31/13, 08/31/13, 10/01/13 - Personally handed these to patient, advised him to give them to his pharmacist, and told him we could not replace them if they were lost - Reassessment in 3 months

## 2013-07-10 NOTE — Assessment & Plan Note (Signed)
BP Readings from Last 3 Encounters:  07/10/13 112/72  06/26/13 142/82  06/10/13 102/72    Lab Results  Component Value Date   NA 137 05/15/2013   K 4.5 05/15/2013   CREATININE 0.88 05/15/2013    Assessment: Blood pressure control: controlled Progress toward BP goal:  at goal  Plan: Medications:  continue current medications - losartan 25 mg daily Educational resources provided: brochure Self management tools provided: home blood pressure logbook

## 2013-07-10 NOTE — Progress Notes (Signed)
Attending physician note: Presenting problems, physical findings, and medications, reviewed with resident physician Dr. Lesly Dukes and I concur with the management plan. Murriel Hopper, MD, [place]  Hematology-Oncology/Internal Medicine

## 2013-07-24 ENCOUNTER — Ambulatory Visit: Payer: No Typology Code available for payment source | Admitting: Adult Health

## 2013-08-08 ENCOUNTER — Encounter: Payer: Self-pay | Admitting: Adult Health

## 2013-08-08 ENCOUNTER — Ambulatory Visit (INDEPENDENT_AMBULATORY_CARE_PROVIDER_SITE_OTHER): Payer: No Typology Code available for payment source | Admitting: Adult Health

## 2013-08-08 VITALS — BP 124/84 | HR 65 | Temp 97.9°F | Ht 64.0 in | Wt 183.6 lb

## 2013-08-08 DIAGNOSIS — J4489 Other specified chronic obstructive pulmonary disease: Secondary | ICD-10-CM

## 2013-08-08 DIAGNOSIS — J449 Chronic obstructive pulmonary disease, unspecified: Secondary | ICD-10-CM

## 2013-08-08 NOTE — Patient Instructions (Signed)
#  1 goal is to quit smoking .  Continue on Dulera 100/5, 2 puff twice daily - rinse after use.  Follow up Dr. Chase Caller in 3-4 months and As needed

## 2013-08-12 NOTE — Progress Notes (Signed)
Subjective:    Patient ID: Vernon Carpenter, male    DOB: [birth year], 53 y.o.   MRN: [id number]  HPI 53 year old male. VEry poor historian REports   Chronic cough: x several years. Insidious onset. Progressive. Moderate to severe in intensity. No clear cut aggravating factors. Improved by nebs prn and lying flat. Quality is dry cough. It is associated with ACE inhibitor intake and an asthma diagnosis and smoking hx.  There is associted dyspnea  Dyspnea: insidious onset x few years. Progressive. Exertional. Brought on with exertion of walking hallways. RElieved by rest but also present at rest.   Labs   Admitted 05/15/13 with syncope: cardiac issues ruled out including troponin and holter  fElt to be cardiac syncope and referred here  Pulmonary function test 06/13/2013 shows post bronchodilator FEV1 at 2.33 L/79%. FVC of 2.3 L/88% [PRe bronchodilator FEV1 was 2 L/68%] . THere was significant BD response of approx 16%. Post bd  Ratio 70. Total lung capacity 6.25 L 114% predicted and DLCO 24/107% predicted  CXR 05/15/13 - clear  CBC - hgb 15gm% 05/15/13   Has no known CAD In 2012 had calcium score of 0 and no CAD In 2014 had normal myovue except for diaphragmatic attenuation and EF 60%    reports that he has been smoking Cigarettes.  He has a  Age 48 but quit for 3 years in interim. Currently smoking. Always smoked < 1/2 ppd. Total smoking 17ppd or more   08/08/13 Follow up  Returns for COPD follow up Reports breathing is doing well since ov.   Still taking Dulera BID, will need an rx No flare in cough or wheezing .  Discussed smoking cessation  Patient denies any hemoptysis, orthopnea, PND, or leg swelling.  No increased SABA use.     Review of Systems Constitutional:   No  weight loss, night sweats,  Fevers, chills, +fatigue, or  lassitude.  HEENT:   No headaches,  Difficulty swallowing,  Tooth/dental problems, or  Sore throat,                No sneezing, itching, ear ache,    +nasal congestion, post nasal drip,   CV:  No chest pain,  Orthopnea, PND, swelling in lower extremities, anasarca, dizziness, palpitations, syncope.   GI  No heartburn, indigestion, abdominal pain, nausea, vomiting, diarrhea, change in bowel habits, loss of appetite, bloody stools.   Resp:    No non-productive cough,  No coughing up of blood.  No change in color of mucus.  No wheezing.  No chest wall deformity  Skin: no rash or lesions.  GU: no dysuria, change in color of urine, no urgency or frequency.  No flank pain, no hematuria   MS:  No joint pain or swelling.  No decreased range of motion.  No back pain.  Psych:  No change in mood or affect. No depression or anxiety.  No memory loss.         Objective:   Physical Exam GEN: A/Ox3; pleasant , NAD, decreased BS in bases   HEENT:  [place]/AT,  EACs-clear, TMs-wnl, NOSE-clear, THROAT-clear, no lesions, no postnasal drip or exudate noted.   NECK:  Supple w/ fair ROM; no JVD; normal carotid impulses w/o bruits; no thyromegaly or nodules palpated; no lymphadenopathy.  RESP  Decreased BS in bases  No accessory muscle use, no dullness to percussion  CARD:  RRR, no m/r/g  , no peripheral edema, pulses intact, no cyanosis or clubbing.  GI:   Soft & nt; nml bowel sounds; no organomegaly or masses detected.  Musco: Warm bil, no deformities or joint swelling noted.   Neuro: alert, no focal deficits noted.    Skin: Warm, no lesions or rashes         Assessment & Plan:

## 2013-08-12 NOTE — Assessment & Plan Note (Signed)
Improved compensation  Smoking cessation discussed   Plan  #1 goal is to quit smoking .  Continue on Dulera 100/5, 2 puff twice daily - rinse after use.  Follow up Dr. Chase Caller in 3-4 months and As needed

## 2013-08-15 ENCOUNTER — Encounter: Payer: Self-pay | Admitting: Internal Medicine

## 2013-08-15 ENCOUNTER — Ambulatory Visit (INDEPENDENT_AMBULATORY_CARE_PROVIDER_SITE_OTHER): Payer: No Typology Code available for payment source | Admitting: Internal Medicine

## 2013-08-15 VITALS — BP 117/80 | HR 79 | Temp 97.8°F | Ht 64.0 in | Wt 181.5 lb

## 2013-08-15 DIAGNOSIS — M549 Dorsalgia, unspecified: Secondary | ICD-10-CM

## 2013-08-15 NOTE — Assessment & Plan Note (Signed)
Assessment -Back pain 2/2 arthritic changes found on MRI -Left sided numbness/tingling on his back may represent new changes but not consistent with results of straight-leg test  Plan -Offered lidocaine patches but patient could not afford (no job or insurance/orange card) -Offered referral to Sports Medicine for IM Depo-Medrol but patient refused -Will not prescribe more Norco today given that it's the first time I'm seeing the patient and that his reported use of pain medicine would mean he has a sufficient amount -Advised patient to maintain activity as tolerated and avoid bed rest -Will f/u with PCP in September

## 2013-08-15 NOTE — Progress Notes (Signed)
   Subjective:    Patient ID: Vernon Carpenter, male    DOB: [birth year], 53 y.o.   MRN: [id number]  HPI Vernon Carpenter is a 53 year old male with a history of cervical disk disease and chronic pain, asthma, HTN who presents today for back/hip pain.  Per EHR, he was seen by Sports Medicine about one year ago (08/20/12) where his low back pain was thought to be 2/2 lumbar degenerative disc disease and facet arthropathy upon reviewing a lumbar MRI from 2013. They tried him on a 7-day course of Mobic 15mg  which he doesn't remember helping and offered IM Depo-Medrol.  Today, he says pain is bilateral around his beltline. It keeps him up at night, and he sometimes struggles with getting out of bed. He cannot pinpoint a specific onset - just that it's been going on for several years. He describes the pain as sharp and has worsened since he last saw Dr. Lucila Maine back on 07/10/13. He feels mild relief with propping his legs on the couch. Pain is controlled with 2-3 tablets of Norco. He does not some pain/tingling in his arms and sometimes in his left leg. He denies any heavy lifting, back surgery, trauma, bowel/bladder dysfunction.   Review of Systems  Constitutional: Positive for appetite change.  Gastrointestinal:       No problems with bowel movements  Genitourinary: Negative for difficulty urinating.  Neurological: Positive for numbness.  All other systems reviewed and are negative.      Objective:   Physical Exam  Constitutional: He is oriented to person, place, and time. He appears well-developed. No distress.  HENT:  Head: Normocephalic and atraumatic.  Eyes: Conjunctivae are normal. Pupils are equal, round, and reactive to light.  Musculoskeletal: He exhibits no tenderness.  No pain with palpation around area of pain. Straight leg test elicited pain ipsilaterally with right leg, contralaterally with left leg.   Neurological: He is alert and oriented to person, place, and time. No cranial nerve  deficit.  Skin: He is not diaphoretic.  Psychiatric: He has a normal mood and affect. His behavior is normal.          Assessment & Plan:

## 2013-08-15 NOTE — Patient Instructions (Addendum)
Please continue activity as much as you can tolerate and follow-up with your PCP, Dr. Genene Churn, in September for your regular visit.   Thank you!

## 2013-08-16 NOTE — Progress Notes (Signed)
I saw and evaluated the patient.  I personally confirmed the key portions of the history and exam documented by Dr. Patel and I reviewed pertinent patient test results.  The assessment, diagnosis, and plan were formulated together and I agree with the documentation in the resident's note. 

## 2013-09-26 ENCOUNTER — Other Ambulatory Visit: Payer: Self-pay | Admitting: *Deleted

## 2013-09-26 MED ORDER — GABAPENTIN 300 MG PO CAPS: 300.0000 mg | ORAL_CAPSULE | Freq: Three times a day (TID) | ORAL | Status: DC

## 2013-10-10 ENCOUNTER — Encounter: Payer: No Typology Code available for payment source | Admitting: Internal Medicine

## 2013-10-11 ENCOUNTER — Encounter (HOSPITAL_COMMUNITY): Payer: Self-pay | Admitting: Emergency Medicine

## 2013-10-11 ENCOUNTER — Ambulatory Visit (HOSPITAL_COMMUNITY)
Admission: RE | Admit: 2013-10-11 | Discharge: 2013-10-11 | Disposition: A | Discharge status: Home / Self Care | Payer: Medicaid Other | Source: Ambulatory Visit | Attending: Family Medicine | Admitting: Family Medicine

## 2013-10-11 ENCOUNTER — Ambulatory Visit (INDEPENDENT_AMBULATORY_CARE_PROVIDER_SITE_OTHER): Payer: Medicaid Other | Admitting: Internal Medicine

## 2013-10-11 ENCOUNTER — Emergency Department (HOSPITAL_COMMUNITY)
Admission: EM | Admit: 2013-10-11 | Discharge: 2013-10-11 | Disposition: A | Discharge status: Home / Self Care | Payer: Medicaid Other | Attending: Emergency Medicine | Admitting: Emergency Medicine

## 2013-10-11 ENCOUNTER — Emergency Department (HOSPITAL_COMMUNITY): Payer: Medicaid Other

## 2013-10-11 ENCOUNTER — Encounter: Payer: Self-pay | Admitting: Internal Medicine

## 2013-10-11 VITALS — BP 121/81 | HR 72 | Temp 98.1°F | Ht 64.0 in | Wt 181.8 lb

## 2013-10-11 DIAGNOSIS — K219 Gastro-esophageal reflux disease without esophagitis: Secondary | ICD-10-CM | POA: Diagnosis not present

## 2013-10-11 DIAGNOSIS — F17200 Nicotine dependence, unspecified, uncomplicated: Secondary | ICD-10-CM | POA: Insufficient documentation

## 2013-10-11 DIAGNOSIS — G8929 Other chronic pain: Secondary | ICD-10-CM | POA: Diagnosis not present

## 2013-10-11 DIAGNOSIS — Z79899 Other long term (current) drug therapy: Secondary | ICD-10-CM | POA: Insufficient documentation

## 2013-10-11 DIAGNOSIS — M129 Arthropathy, unspecified: Secondary | ICD-10-CM | POA: Insufficient documentation

## 2013-10-11 DIAGNOSIS — R9431 Abnormal electrocardiogram [ECG] [EKG]: Secondary | ICD-10-CM | POA: Diagnosis not present

## 2013-10-11 DIAGNOSIS — Z7982 Long term (current) use of aspirin: Secondary | ICD-10-CM | POA: Diagnosis not present

## 2013-10-11 DIAGNOSIS — IMO0002 Reserved for concepts with insufficient information to code with codable children: Secondary | ICD-10-CM | POA: Diagnosis not present

## 2013-10-11 DIAGNOSIS — E7800 Pure hypercholesterolemia, unspecified: Secondary | ICD-10-CM | POA: Diagnosis not present

## 2013-10-11 DIAGNOSIS — J449 Chronic obstructive pulmonary disease, unspecified: Secondary | ICD-10-CM | POA: Insufficient documentation

## 2013-10-11 DIAGNOSIS — R55 Syncope and collapse: Secondary | ICD-10-CM | POA: Diagnosis not present

## 2013-10-11 DIAGNOSIS — R079 Chest pain, unspecified: Secondary | ICD-10-CM | POA: Insufficient documentation

## 2013-10-11 DIAGNOSIS — R059 Cough, unspecified: Secondary | ICD-10-CM | POA: Diagnosis not present

## 2013-10-11 DIAGNOSIS — J4489 Other specified chronic obstructive pulmonary disease: Secondary | ICD-10-CM | POA: Insufficient documentation

## 2013-10-11 DIAGNOSIS — M549 Dorsalgia, unspecified: Secondary | ICD-10-CM

## 2013-10-11 DIAGNOSIS — R0789 Other chest pain: Secondary | ICD-10-CM | POA: Diagnosis not present

## 2013-10-11 DIAGNOSIS — F411 Generalized anxiety disorder: Secondary | ICD-10-CM | POA: Diagnosis not present

## 2013-10-11 DIAGNOSIS — I10 Essential (primary) hypertension: Secondary | ICD-10-CM

## 2013-10-11 DIAGNOSIS — M545 Low back pain, unspecified: Secondary | ICD-10-CM | POA: Diagnosis not present

## 2013-10-11 DIAGNOSIS — R05 Cough: Secondary | ICD-10-CM | POA: Insufficient documentation

## 2013-10-11 DIAGNOSIS — M542 Cervicalgia: Secondary | ICD-10-CM | POA: Insufficient documentation

## 2013-10-11 LAB — BASIC METABOLIC PANEL
Anion gap: 12 (ref 5–15)
BUN: 14 mg/dL (ref 6–23)
CALCIUM: 9.6 mg/dL (ref 8.4–10.5)
CO2: 28 mEq/L (ref 19–32)
CREATININE: 0.95 mg/dL (ref 0.50–1.35)
Chloride: 101 mEq/L (ref 96–112)
GFR calc non Af Amer: >90 mL/min (ref >90)
Glucose, Bld: 96 mg/dL (ref 70–99)
Potassium: 4.5 mEq/L (ref 3.7–5.3)
Sodium: 141 mEq/L (ref 137–147)

## 2013-10-11 LAB — CBC
HCT: 44.8 % (ref 39.0–52.0)
Hemoglobin: 16 g/dL (ref 13.0–17.0)
MCH: 32.7 pg (ref 26.0–34.0)
MCHC: 35.7 g/dL (ref 30.0–36.0)
MCV: 91.6 fL (ref 78.0–100.0)
Platelets: 182 10*3/uL (ref 150–400)
RBC: 4.89 MIL/uL (ref 4.22–5.81)
RDW: 12.8 % (ref 11.5–15.5)
WBC: 8.6 10*3/uL (ref 4.0–10.5)

## 2013-10-11 LAB — PRO B NATRIURETIC PEPTIDE: Pro B Natriuretic peptide (BNP): 41.3 pg/mL (ref 0–125)

## 2013-10-11 LAB — I-STAT TROPONIN, ED: Troponin i, poc: 0 ng/mL (ref 0.00–0.08)

## 2013-10-11 MED ORDER — GABAPENTIN 300 MG PO CAPS: 300.0000 mg | ORAL_CAPSULE | Freq: Three times a day (TID) | ORAL | Status: DC

## 2013-10-11 MED ORDER — LOSARTAN POTASSIUM 25 MG PO TABS: 25.0000 mg | ORAL_TABLET | Freq: Every day | ORAL | Status: DC

## 2013-10-11 MED ORDER — HYDROCODONE-ACETAMINOPHEN 10-325 MG PO TABS: 1.0000 | ORAL_TABLET | Freq: Four times a day (QID) | ORAL | Status: DC | PRN

## 2013-10-11 MED ORDER — RANITIDINE HCL 150 MG PO TABS: 150.0000 mg | ORAL_TABLET | Freq: Two times a day (BID) | ORAL | Status: DC

## 2013-10-11 MED ORDER — ASPIRIN 325 MG PO TABS
325.0000 mg | ORAL_TABLET | ORAL | Status: AC
  Administered 2013-10-11: 325 mg via ORAL
  Filled 2013-10-11: qty 1

## 2013-10-11 MED ORDER — PANTOPRAZOLE SODIUM 40 MG PO TBEC: 40.0000 mg | DELAYED_RELEASE_TABLET | Freq: Every day | ORAL | Status: DC

## 2013-10-11 MED ORDER — NITROGLYCERIN 0.4 MG SL SUBL: 0.4000 mg | SUBLINGUAL_TABLET | SUBLINGUAL | Status: DC | PRN

## 2013-10-11 NOTE — Assessment & Plan Note (Addendum)
Has constant LBP in belt like pattern with occasional radiation to the legs. Has no neuro deficit on exam. Offered physical therapy but patient stated it didn't help. No alarm symptoms other than chronic pain. Feels norco helps control his pain somewhat.   Checked UDS - appropriate.   Refilled norco 120 tabs (q6hr PRN). Continue neurontin (refilled)
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
## 2013-10-11 NOTE — Progress Notes (Signed)
Subjective:    Patient ID: Vernon Carpenter, male    DOB: 1960/07/18, 53 y.o.   MRN: [id number]  HPI 53 yo male with hx of HTN, syncope, COPD, cervical neck pain with evidence of disc disease and disabling back pain comes here for follow up.  He states that he is in a lot of pain everyday. His lower back has 7/10 sharp pain in a belt like pattern on the back with intermittent radiation to the leg. Left is worse than right. Has been taking norco q6 hr PRN without much relief. He also has pain on his neck and shoulder with some intermittent numbness on the left hand. Has tried PT in the past without much relief. Had seen surgery consultant in the past but surgery was not an option for him.   He also has occasional dizziness happening twice a week on average. Usually happens in the morning when he is getting up and he has to lie down to feel better. Has been drinking 6-7 sodas daily but not much water. No chest pain or palpitations or other symptoms associated with the dizziness. He does however have chronic chest pain on the left chest which is tender to touch. No change with activities but some movements can worsen it. Also feels some distension in the abdomen. Has GERD for which he takes zantac.   Review of Systems  Constitutional: Negative.   HENT: Negative.  Negative for congestion, ear pain, hearing loss, nosebleeds, rhinorrhea, sinus pressure, sneezing and sore throat.   Eyes: Negative.   Respiratory: Positive for cough, shortness of breath and wheezing.   Cardiovascular: Positive for chest pain. Negative for palpitations and leg swelling.  Gastrointestinal: Positive for abdominal distention. Negative for nausea, vomiting, abdominal pain, diarrhea, constipation, blood in stool and anal bleeding.  Endocrine: Negative.   Genitourinary: Negative.   Musculoskeletal: Positive for back pain and neck pain.  Skin: Negative.   Allergic/Immunologic: Negative.   Neurological: Positive for dizziness.  Negative for tremors, seizures, syncope, speech difficulty, weakness, numbness and headaches.  Hematological: Negative.   Psychiatric/Behavioral: Negative.        Objective:   Physical Exam  Constitutional: He is oriented to person, place, and time. He appears well-developed and well-nourished. No distress.  HENT:  Head: Normocephalic and atraumatic.  Right Ear: External ear normal.  Left Ear: External ear normal.  Nose: Nose normal.  Mouth/Throat: Oropharynx is clear and moist.  Eyes: Conjunctivae and EOM are normal. Pupils are equal, round, and reactive to light. Right eye exhibits no discharge. Left eye exhibits no discharge. No scleral icterus.  Neck: Normal range of motion. Neck supple. No JVD present. No thyromegaly present.  Cardiovascular: Normal rate, regular rhythm, S1 normal, S2 normal, normal heart sounds and intact distal pulses.  Exam reveals no gallop and no friction rub.   No murmur heard.   TTP under left breast/chest wall.  Pulmonary/Chest: Effort normal and breath sounds normal. No respiratory distress. He has no wheezes. He has no rales. He exhibits no tenderness.  Abdominal: Soft. Bowel sounds are normal. He exhibits no distension and no mass. There is no tenderness. There is no rebound and no guarding.  Musculoskeletal: Normal range of motion. He exhibits no edema and no tenderness.  Lymphadenopathy:    He has no cervical adenopathy.  Neurological: He is alert and oriented to person, place, and time. He has normal strength and normal reflexes. No cranial nerve deficit or sensory deficit.  Skin: No rash noted. He  is not diaphoretic. No erythema. No pallor.  Psychiatric: He has a normal mood and affect. His behavior is normal.    orthostats negative EKG shows TWI in V1 and V2 which was not present in the past.      Assessment & Plan:  See problem based a&p.

## 2013-10-11 NOTE — ED Notes (Signed)
Patient presenting from [facility] clinic with ECG changes that show, per IM note, "normal sinus rhythm significant for biphasic T wave in V2 which is a change from previous ECG."  Sent to ED for stat troponin.  No nausea, vomiting, sweating.  Pain is not reproducible with palpation.  Patient states that the pain in his chest has been there for a long time and it is intermittent.  Left side mid clavicular pain, rated 3/10 and described as discomfort, worsens with movement and activity.  Does not remember when it first began.  Also has chronic "smokers cough"

## 2013-10-11 NOTE — Assessment & Plan Note (Signed)
Filed Vitals:   10/11/13 1555  BP: 121/81  Pulse: 72  Temp: 98.1 F (36.7 C)   BP under goal. Continue same regimen: losartan 25 mg daily. Refilled.

## 2013-10-11 NOTE — Discharge Instructions (Signed)

## 2013-10-11 NOTE — Progress Notes (Signed)
Patient ID: Vernon Carpenter, male   DOB: 09/04/1960, 53 y.o.   MRN: [id number]  Patient complaining of aggravated chest pain from before. EKG done in clinic shows normal sinus rhythm significant for biphasic T wave in V2 change from previous EKG, J point ST elevations in v4-6 same as before. Patient sent to the ER for stat troponin. Associated symptoms: No nausea, vomiting, sweating. Point tenderness on palpation at the pain location (precordium)  Other issues per resident note. I saw and evaluated the patient.  I personally confirmed the key portions of the history and exam documented by Dr. Genene Churn and I reviewed pertinent patient test results.  The assessment, diagnosis, and plan were formulated together and I agree with the documentation in the resident's note.

## 2013-10-11 NOTE — Patient Instructions (Addendum)
Cut down on the soda. Drink more water. You may be dehydrate which is causing dizziness. Let us know if dizziness continues. We may do further testing later.  For your chest pain, you can try protonix. It helps with acid in your stomach. Don't need to take zantac anymore.  Continue taking norco as needed. Filled your prescription today. Continue gabapentin.  Continue all other medicines as you are.

## 2013-10-12 LAB — PRESCRIPTION ABUSE MONITORING 15P, URINE
Amphetamine/Meth: NEGATIVE ng/mL
Barbiturate Screen, Urine: NEGATIVE ng/mL
Benzodiazepine Screen, Urine: NEGATIVE ng/mL
Buprenorphine, Urine: NEGATIVE ng/mL
COCAINE METABOLITES: NEGATIVE ng/mL
CREATININE, URINE: 185.26 mg/dL (ref >20.0)
Cannabinoid Scrn, Ur: NEGATIVE ng/mL
Carisoprodol, Urine: NEGATIVE ng/mL
Fentanyl, Ur: NEGATIVE ng/mL
Meperidine, Ur: NEGATIVE ng/mL
Methadone Screen, Urine: NEGATIVE ng/mL
OXYCODONE SCRN UR: NEGATIVE ng/mL
Propoxyphene: NEGATIVE ng/mL
TRAMADOL UR: NEGATIVE ng/mL
Zolpidem, Urine: NEGATIVE ng/mL

## 2013-10-13 ENCOUNTER — Encounter: Payer: Self-pay | Admitting: Internal Medicine

## 2013-10-13 DIAGNOSIS — K219 Gastro-esophageal reflux disease without esophagitis: Secondary | ICD-10-CM | POA: Insufficient documentation

## 2013-10-13 NOTE — Assessment & Plan Note (Signed)
Has been taking zantac for GERD. Continues to have some atypical chest pain which could be from GERD. Will change him to protonix and d/c zantac.

## 2013-10-13 NOTE — Assessment & Plan Note (Addendum)
Likely MSK pain on chest since it's chronic, not worsened by exertion, TTP on chest. Could also be GERD related. No other symptoms of MI such as n/v, palpitations, headache, sweating.   Gas/bloating could be another cause of cp. Started protonix, asked patient to take gas x OTC for bloating.    Checked EKG. Shows V1 and V2 TWI's which is new from the previous EKG. Sent to ED for troponins as clinic lab was closed. Trops were negative at the ED.

## 2013-10-13 NOTE — Assessment & Plan Note (Addendum)
Has hx of syncope but now has occasional dizziness. Cardiac cause was ruled out per cardiology previously with normal ECHO, normal myovue, normal ct. EKG was normal in the past. He didn't get long term cardiac monitoring as he was not having any palpitations which would activate monitoring.  EKG done today shows some TWI in V1 and V2. Sent to ED for trops and was normal. Orthostats in the clinic normal.  Dizziness likely from dehydration as he drinks 6-7 sodas daily. Asked patient to come back when he feels dizzy so we can catch it. Also asked him to drink more water and cut down on the soda to max 1 daily. It could also be from opioid use.

## 2013-10-15 LAB — OPIATES/OPIOIDS (LC/MS-MS)
Codeine Urine: NEGATIVE ng/mL (ref <50)
Hydrocodone: 2849 ng/mL — ABNORMAL HIGH (ref <50)
Hydromorphone: 649 ng/mL — ABNORMAL HIGH (ref <50)
MORPHINE: NEGATIVE ng/mL (ref <50)
NORHYDROCODONE, UR: 2917 ng/mL — AB (ref <50)
Noroxycodone, Ur: NEGATIVE ng/mL (ref <50)
OXYCODONE, UR: NEGATIVE ng/mL (ref <50)
OXYMORPHONE, URINE: NEGATIVE ng/mL (ref <50)

## 2013-10-28 ENCOUNTER — Other Ambulatory Visit: Payer: Self-pay | Admitting: *Deleted

## 2013-10-30 MED ORDER — LOVASTATIN 20 MG PO TABS
20.0000 mg | ORAL_TABLET | Freq: Every day | ORAL | Status: DC
Start: 2013-10-30 — End: 2014-10-27

## 2013-11-11 ENCOUNTER — Encounter: Payer: Self-pay | Admitting: Internal Medicine

## 2013-11-11 ENCOUNTER — Ambulatory Visit (INDEPENDENT_AMBULATORY_CARE_PROVIDER_SITE_OTHER): Payer: Medicaid Other | Admitting: Internal Medicine

## 2013-11-11 VITALS — BP 115/73 | HR 72 | Temp 98.0°F | Ht 64.0 in | Wt 179.9 lb

## 2013-11-11 DIAGNOSIS — Z720 Tobacco use: Secondary | ICD-10-CM

## 2013-11-11 DIAGNOSIS — M549 Dorsalgia, unspecified: Secondary | ICD-10-CM

## 2013-11-11 DIAGNOSIS — K219 Gastro-esophageal reflux disease without esophagitis: Secondary | ICD-10-CM

## 2013-11-11 DIAGNOSIS — F17200 Nicotine dependence, unspecified, uncomplicated: Secondary | ICD-10-CM

## 2013-11-11 DIAGNOSIS — Z23 Encounter for immunization: Secondary | ICD-10-CM

## 2013-11-11 DIAGNOSIS — IMO0001 Reserved for inherently not codable concepts without codable children: Secondary | ICD-10-CM

## 2013-11-11 DIAGNOSIS — M5090 Cervical disc disorder, unspecified, unspecified cervical region: Secondary | ICD-10-CM

## 2013-11-11 MED ORDER — HYDROCODONE-ACETAMINOPHEN 10-325 MG PO TABS: 1.0000 | ORAL_TABLET | Freq: Four times a day (QID) | ORAL | Status: DC | PRN

## 2013-11-11 NOTE — Progress Notes (Signed)
Subjective:    Patient ID: Vernon Carpenter, male    DOB: Oct 23, 1960, 53 y.o.   MRN: [id number]  Back Pain Associated symptoms include abdominal pain and numbness. Pertinent negatives include no chest pain, dysuria, fever, headaches or weakness.  Shoulder Pain  Associated symptoms include numbness. Pertinent negatives include no fever.    53 yo male with hx of HTN, syncope, COPD, cervical neck pain with evidence of disc disease and disabling back pain comes here for follow up.  He continues to have similar daily low back pain in belt like pattern. Also has his baseline neck pain and shoulder pain. No changes. Some numbness and tingling on the hands which is chronic.   Continues to have some epigastric pain, is on protonix for GERD.   Trying to quit smoking. Using patches. Wasn't sure how to use it so I educated him.  Has been taking norco with good relief of pain.   Has some SOB with exertion.   Review of Systems  Constitutional: Negative for fever, chills, activity change, appetite change and fatigue.  HENT: Negative for congestion, drooling, ear discharge, ear pain, nosebleeds, postnasal drip, rhinorrhea, sinus pressure and sore throat.   Eyes: Negative for pain, discharge and visual disturbance.  Respiratory: Positive for shortness of breath. Negative for cough, chest tightness and wheezing.   Cardiovascular: Negative for chest pain, palpitations and leg swelling.  Gastrointestinal: Positive for abdominal pain. Negative for nausea, diarrhea, constipation, abdominal distention and anal bleeding.  Genitourinary: Negative for dysuria, urgency, hematuria, decreased urine volume and difficulty urinating.  Musculoskeletal: Positive for arthralgias, back pain and neck pain. Negative for joint swelling, myalgias and neck stiffness.  Skin: Negative.   Allergic/Immunologic: Negative.   Neurological: Positive for numbness. Negative for dizziness, seizures, syncope, speech difficulty,  weakness, light-headedness and headaches.  Hematological: Negative.   Psychiatric/Behavioral: Negative.         Objective:   Physical Exam  Constitutional: He is oriented to person, place, and time. He appears well-developed and well-nourished. No distress.  HENT:  Head: Normocephalic and atraumatic.  Right Ear: External ear normal.  Left Ear: External ear normal.  Nose: Nose normal.  Mouth/Throat: Oropharynx is clear and moist.  Eyes: Conjunctivae and EOM are normal. Pupils are equal, round, and reactive to light. Right eye exhibits no discharge. Left eye exhibits no discharge. No scleral icterus.  Neck: Normal range of motion. Neck supple. No JVD present. Muscular tenderness present. No spinous process tenderness present. No thyromegaly present.    TTP on the neck musculature and paraspinal muscles.  Cardiovascular: Normal rate, regular rhythm, S1 normal, S2 normal, normal heart sounds and intact distal pulses.  Exam reveals no gallop and no friction rub.   No murmur heard. Pulmonary/Chest: Effort normal and breath sounds normal. No respiratory distress. He has no wheezes. He has no rales. He exhibits no tenderness.  Abdominal: Soft. Bowel sounds are normal. He exhibits no distension and no mass. There is tenderness. There is no rebound and no guarding.  Some TTP on epigastric region  Musculoskeletal: Normal range of motion. He exhibits no edema and no tenderness.  Lymphadenopathy:    He has no cervical adenopathy.  Neurological: He is alert and oriented to person, place, and time. He has normal strength and normal reflexes. No cranial nerve deficit or sensory deficit.  Fully intact, 5/5 strength. FROM on all joints. Has some pain with movement.  Skin: No rash noted. He is not diaphoretic. No erythema. No pallor.  Psychiatric:  He has a normal mood and affect. His behavior is normal.        Assessment & Plan:  See problem based a&p.

## 2013-11-11 NOTE — Patient Instructions (Signed)
Keep taking your medicines as you are.  I gave you refill for your norco for 2 months. Don't lose the prescriptions!  General Instructions:   Thank you for bringing your medicines today. This helps [place] keep you safe from mistakes.   Progress Toward Treatment Goals:  Treatment Goal 07/10/2013  Blood pressure at goal  Stop smoking -    Self Care Goals & Plans:  Self Care Goal 10/11/2013  Manage my medications take my medicines as prescribed; bring my medications to every visit; refill my medications on time  Monitor my health -  Eat healthy foods eat more vegetables; eat foods that are low in salt; eat baked foods instead of fried foods  Be physically active find an activity I enjoy  Stop smoking cut down the number of cigarettes smoked    No flowsheet data found.   Care Management & Community Referrals:  Referral 09/06/2012  Referrals made for care management support none needed

## 2013-11-11 NOTE — Assessment & Plan Note (Signed)
Continues to have her usual LBP in belt like pattern. Under control with norco. No neuro deficits on exam. No changes.   Pain has been stable for last several years. No changes in symptoms. Will not do any new imaging. Continue same medication (norco).   Refilled norco 10-325 #120 per month for 2 months for back and neck pain.   Re-assess in 3 months.

## 2013-11-11 NOTE — Assessment & Plan Note (Signed)
Is using nicotine patch to quit smoking. Was confused about how to use it. I have educated him.

## 2013-11-11 NOTE — Assessment & Plan Note (Signed)
Has been doing well on protonix for last 1 month. Has some epigastric pain still but it has improved.  Continue protonix.

## 2013-11-11 NOTE — Assessment & Plan Note (Signed)
Pain has been stable for last several years. No changes in symptoms. Will not do any new imaging. Continue same medication (norco).   Refilled norco 10-325 #120 per month for 2 months for back and neck pain.   Re-assess in 3 months.

## 2013-11-12 NOTE — Progress Notes (Signed)
Internal Medicine Clinic Attending  Case discussed with Dr. Ahmed at the time of the visit.  We reviewed the resident's history and exam and pertinent patient test results.  I agree with the assessment, diagnosis, and plan of care documented in the resident's note. 

## 2013-11-15 IMAGING — CR DG ORBITS FOR FOREIGN BODY
2 series · 2 of 2 positions shown · non-contrast
Comparison: None.

CLINICAL DATA: Previous metal exposure.  The patient is scheduled
for MRI.

ORBITS FOR FOREIGN BODY - 2 VIEW

[w waters pa (1 of 2)]
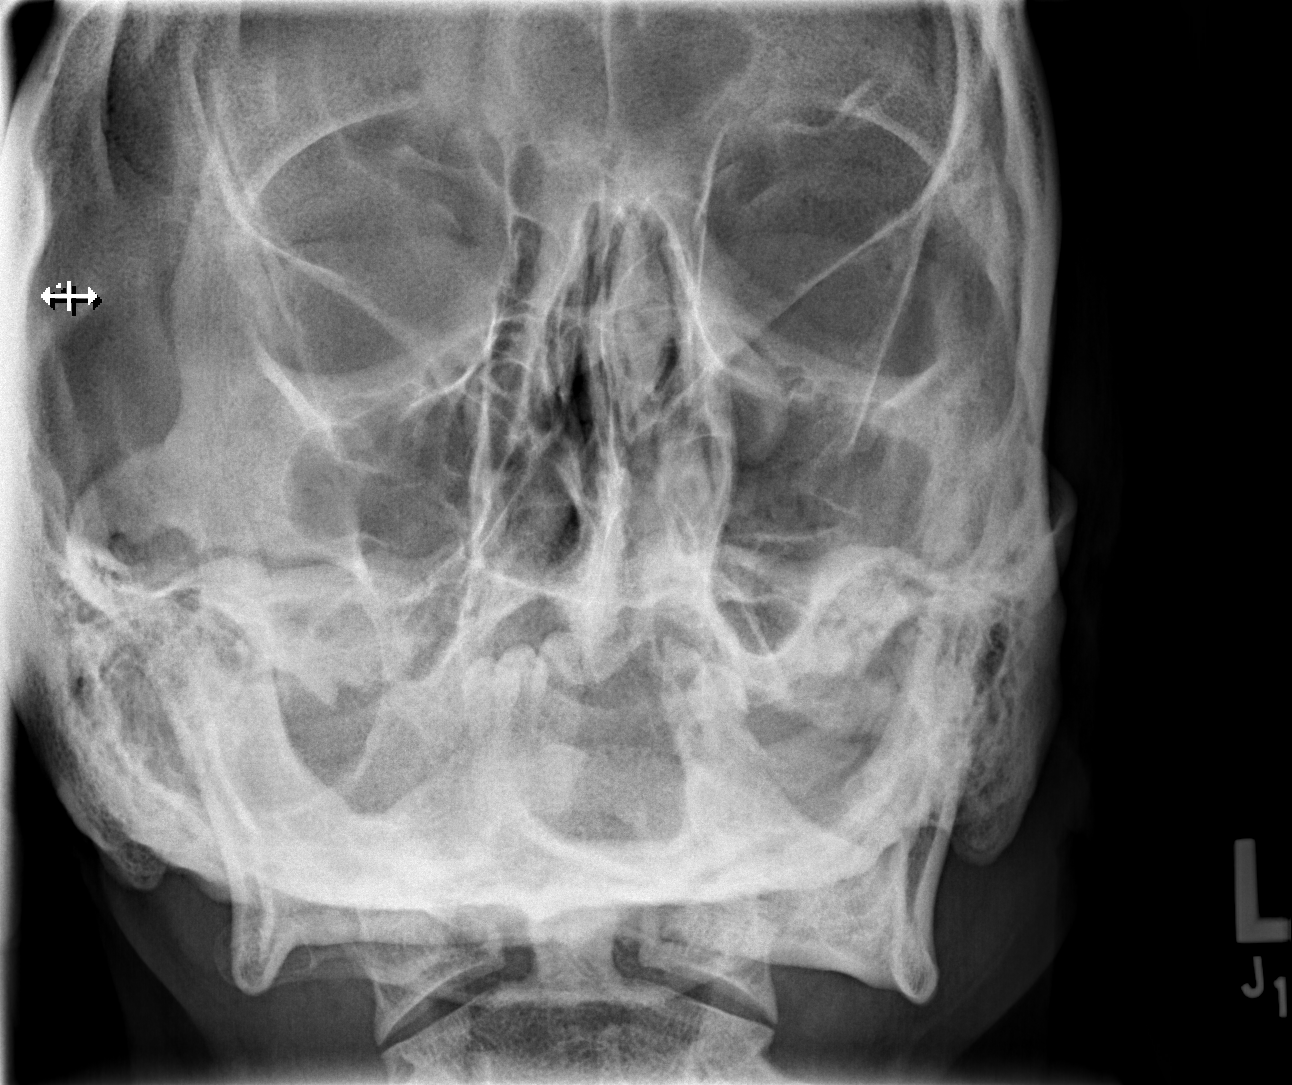

[w waters pa (2 of 2)]
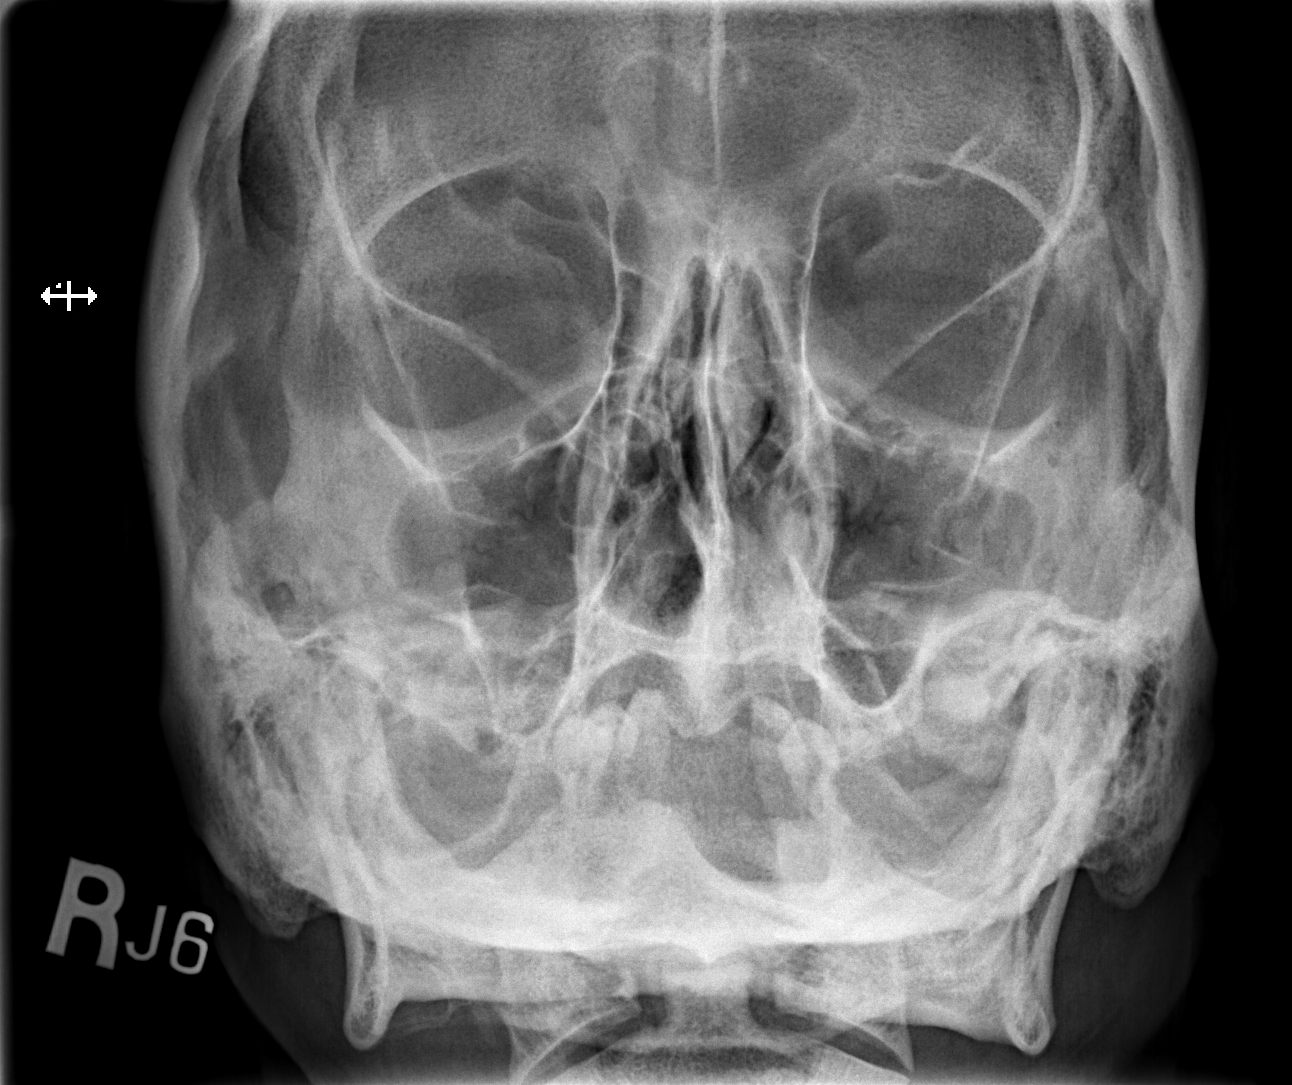

[2 of 2 positions shown; findings below may reference images not displayed]

FINDINGS: The no radiopaque foreign body is projected over the
orbits on either image.  The paranasal sinuses are clear.
IMPRESSION: No radiopaque foreign body is projected over the orbits.

## 2013-12-09 ENCOUNTER — Ambulatory Visit (INDEPENDENT_AMBULATORY_CARE_PROVIDER_SITE_OTHER): Payer: Medicaid Other | Admitting: Internal Medicine

## 2013-12-09 ENCOUNTER — Encounter: Payer: Self-pay | Admitting: Internal Medicine

## 2013-12-09 ENCOUNTER — Emergency Department (HOSPITAL_COMMUNITY)
Admission: EM | Admit: 2013-12-09 | Discharge: 2013-12-09 | Disposition: A | Discharge status: Home / Self Care | Payer: Medicaid Other | Attending: Emergency Medicine | Admitting: Emergency Medicine

## 2013-12-09 ENCOUNTER — Encounter (HOSPITAL_COMMUNITY): Payer: Self-pay | Admitting: Emergency Medicine

## 2013-12-09 ENCOUNTER — Emergency Department (HOSPITAL_COMMUNITY): Payer: Medicaid Other

## 2013-12-09 VITALS — BP 136/86 | HR 77 | Ht 64.0 in | Wt 182.0 lb

## 2013-12-09 DIAGNOSIS — Z7982 Long term (current) use of aspirin: Secondary | ICD-10-CM | POA: Insufficient documentation

## 2013-12-09 DIAGNOSIS — W108XXA Fall (on) (from) other stairs and steps, initial encounter: Secondary | ICD-10-CM | POA: Insufficient documentation

## 2013-12-09 DIAGNOSIS — Y998 Other external cause status: Secondary | ICD-10-CM | POA: Insufficient documentation

## 2013-12-09 DIAGNOSIS — G8929 Other chronic pain: Secondary | ICD-10-CM | POA: Insufficient documentation

## 2013-12-09 DIAGNOSIS — Z720 Tobacco use: Secondary | ICD-10-CM

## 2013-12-09 DIAGNOSIS — J449 Chronic obstructive pulmonary disease, unspecified: Secondary | ICD-10-CM | POA: Diagnosis not present

## 2013-12-09 DIAGNOSIS — K219 Gastro-esophageal reflux disease without esophagitis: Secondary | ICD-10-CM | POA: Diagnosis not present

## 2013-12-09 DIAGNOSIS — I10 Essential (primary) hypertension: Secondary | ICD-10-CM | POA: Insufficient documentation

## 2013-12-09 DIAGNOSIS — M1388 Other specified arthritis, other site: Secondary | ICD-10-CM | POA: Insufficient documentation

## 2013-12-09 DIAGNOSIS — M25519 Pain in unspecified shoulder: Secondary | ICD-10-CM

## 2013-12-09 DIAGNOSIS — W010XXA Fall on same level from slipping, tripping and stumbling without subsequent striking against object, initial encounter: Secondary | ICD-10-CM | POA: Insufficient documentation

## 2013-12-09 DIAGNOSIS — F419 Anxiety disorder, unspecified: Secondary | ICD-10-CM | POA: Diagnosis not present

## 2013-12-09 DIAGNOSIS — Y9301 Activity, walking, marching and hiking: Secondary | ICD-10-CM | POA: Insufficient documentation

## 2013-12-09 DIAGNOSIS — Y92019 Unspecified place in single-family (private) house as the place of occurrence of the external cause: Secondary | ICD-10-CM | POA: Insufficient documentation

## 2013-12-09 DIAGNOSIS — S4992XA Unspecified injury of left shoulder and upper arm, initial encounter: Secondary | ICD-10-CM | POA: Insufficient documentation

## 2013-12-09 DIAGNOSIS — M25512 Pain in left shoulder: Secondary | ICD-10-CM

## 2013-12-09 DIAGNOSIS — Z79899 Other long term (current) drug therapy: Secondary | ICD-10-CM | POA: Insufficient documentation

## 2013-12-09 DIAGNOSIS — J4541 Moderate persistent asthma with (acute) exacerbation: Secondary | ICD-10-CM

## 2013-12-09 DIAGNOSIS — E780 Pure hypercholesterolemia: Secondary | ICD-10-CM | POA: Insufficient documentation

## 2013-12-09 MED ORDER — MOMETASONE FURO-FORMOTEROL FUM 200-5 MCG/ACT IN AERO: 2.0000 | INHALATION_SPRAY | Freq: Two times a day (BID) | RESPIRATORY_TRACT | Status: DC

## 2013-12-09 MED ORDER — PREDNISONE 10 MG PO TABS: ORAL_TABLET | ORAL | Status: DC

## 2013-12-09 NOTE — ED Notes (Signed)
Fell on left shoulder last week and has been putting  off seeing someone so he came today hhurts to move shoulder has good pulse and can wiggle fingers and good neuro , < 3 sec blanche

## 2013-12-09 NOTE — ED Notes (Signed)
PT reports he slipped on the wet steps at his house . Pt reports he fell down 3 steps and broke the last step when he landed on his lt arm and shoulder.
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
## 2013-12-09 NOTE — Progress Notes (Signed)
Subjective:    Patient ID: Vernon Carpenter, male    DOB: 03/06/1960, 53 y.o.   MRN: [id number]  HPI  53 year old male. VEry poor historian REports   Chronic cough: x several years. Insidious onset. Progressive. Moderate to severe in intensity. No clear cut aggravating factors. Improved by nebs prn and lying flat. Quality is dry cough. It is associated with ACE inhibitor intake and an asthma diagnosis and smoking hx.  There is associted dyspnea  Dyspnea: insidious onset x few years. Progressive. Exertional. Brought on with exertion of walking hallways. RElieved by rest but also present at rest.   Labs   Admitted 05/15/13 with syncope: cardiac issues ruled out including troponin and holter  fElt to be cardiac syncope and referred here  Pulmonary function test 06/13/2013 shows post bronchodilator FEV1 at 2.33 L/79%. FVC of 2.3 L/88% [PRe bronchodilator FEV1 was 2 L/68%] . THere was significant BD response of approx 16%. Post bd  Ratio 70. Total lung capacity 6.25 L 114% predicted and DLCO 24/107% predicted  CXR 05/15/13 - clear  CBC - hgb 15gm% 05/15/13   Has no known CAD In 2012 had calcium score of 0 and no CAD In 2014 had normal myovue except for diaphragmatic attenuation and EF 60%    reports that he has been smoking Cigarettes.  He has a  Age 53 but quit for 3 years in interim. Currently smoking. Always smoked < 1/2 ppd.     08/08/13 Follow up  Returns for COPD follow up Reports breathing is doing well since ov.   Still taking Dulera BID, will need an rx No flare in cough or wheezing .  Discussed smoking cessation  Patient denies any hemoptysis, orthopnea, PND, or leg swelling.  No increased SABA use.   OV 12/09/2013  Chief Complaint  Patient presents with  . Follow-up    Pt c/o wheezing. Pt states he feels his SOB has worsened since last OV. Pt also c/o prod cough with clear mucus. Pt c/o chest tightness under left breast with actiivty     Obstructive lung disease  followup: 53 year old male.  His DLCO is normal and he had 16%  bronchodilator response on PFT May 2015. His smoking history is limited. No emphysema features on CXR in 2015; also it is clear. Therefore evidence favors asthma.  The features do NOT fit in with cOPD. Overall he is better since starting dulera but says over the last month or so he has declined and having more shortness of breath, cough and wheezing. However his compliance is only 50%. There is no fever or other symptoms.   Smoking:  reports that he has been smoking Cigarettes.  He has a 6 pack-year smoking history. He has never used smokeless tobacco.   Past medical history: A week ago he fell down the stairs in the rain he did the stairs broke. Since then his left shoulder is in pain and is unable to abduct or use this. He has not sought any medical treatment  Review of Systems  Constitutional: Negative for fever and unexpected weight change.  HENT: Negative for congestion, dental problem, ear pain, nosebleeds, postnasal drip, rhinorrhea, sinus pressure, sneezing, sore throat and trouble swallowing.   Eyes: Negative for redness and itching.  Respiratory: Positive for cough, chest tightness and shortness of breath. Negative for wheezing.   Cardiovascular: Negative for palpitations and leg swelling.  Gastrointestinal: Negative for nausea and vomiting.  Genitourinary: Negative for dysuria.  Musculoskeletal: Negative for  joint swelling.  Skin: Negative for rash.  Neurological: Negative for headaches.  Hematological: Does not bruise/bleed easily.  Psychiatric/Behavioral: Negative for dysphoric mood. The patient is not nervous/anxious.    Current outpatient prescriptions: albuterol (PROVENTIL) (2.5 MG/3ML) 0.083% nebulizer solution, Take 3 mLs (2.5 mg total) by nebulization every 6 (six) hours as needed for wheezing., Disp: 75 mL, Rfl: 12;  aspirin 81 MG tablet, Take 81 mg by mouth daily., Disp: , Rfl: ;  gabapentin (NEURONTIN) 300 MG  capsule, Take 300 mg by mouth 3 (three) times daily., Disp: , Rfl:  HYDROcodone-acetaminophen (NORCO) 10-325 MG per tablet, Take 1 tablet by mouth every 6 (six) hours as needed for moderate pain., Disp: 120 tablet, Rfl: 0;  losartan (COZAAR) 25 MG tablet, Take 1 tablet (25 mg total) by mouth daily., Disp: 30 tablet, Rfl: 5;  lovastatin (MEVACOR) 20 MG tablet, Take 1 tablet (20 mg total) by mouth daily., Disp: 30 tablet, Rfl: 11 mometasone-formoterol (DULERA) 100-5 MCG/ACT AERO, Inhale 2 puffs into the lungs 2 (two) times daily., Disp: , Rfl: ;  nicotine (NICODERM CQ - DOSED IN MG/24 HOURS) 14 mg/24hr patch, Place 1 patch (14 mg total) onto the skin daily., Disp: 30 patch, Rfl: 11;  pantoprazole (PROTONIX) 40 MG tablet, Take 1 tablet (40 mg total) by mouth daily., Disp: 30 tablet, Rfl: 3 ranitidine (ZANTAC) 150 MG tablet, Take 1 tablet (150 mg total) by mouth 2 (two) times daily., Disp: 60 tablet, Rfl: 11     Objective:   Physical Exam  Constitutional: He is oriented to person, place, and time. He appears well-developed and well-nourished. No distress.  HENT:  Head: Normocephalic and atraumatic.  Right Ear: External ear normal.  Left Ear: External ear normal.  Mouth/Throat: Oropharynx is clear and moist. No oropharyngeal exudate.  Eyes: Conjunctivae and EOM are normal. Pupils are equal, round, and reactive to light. Right eye exhibits no discharge. Left eye exhibits no discharge. No scleral icterus.  Neck: Normal range of motion. Neck supple. No JVD present. No tracheal deviation present. No thyromegaly present.  Cardiovascular: Normal rate, regular rhythm and intact distal pulses.  Exam reveals no gallop and no friction rub.   No murmur heard. Pulmonary/Chest: Effort normal and breath sounds normal. No respiratory distress. He has no wheezes. He has no rales. He exhibits no tenderness.  Abdominal: Soft. Bowel sounds are normal. He exhibits no distension and no mass. There is no tenderness. There  is no rebound and no guarding.  Physical obesity present  Musculoskeletal: Normal range of motion. He exhibits no edema or tenderness.  Unable to abduct or flex his left shoulder. Left shoulder is slightly swollen and warm but no obvious redness or bruising or deformity detected  Lymphadenopathy:    He has no cervical adenopathy.  Neurological: He is alert and oriented to person, place, and time. He has normal reflexes. No cranial nerve deficit. Coordination normal.  Skin: Skin is warm and dry. No rash noted. He is not diaphoretic. No erythema. No pallor.  Psychiatric: He has a normal mood and affect. His behavior is normal. Judgment and thought content normal.  Nursing note and vitals reviewed.   Filed Vitals:   12/09/13 1129  BP: 136/86  Pulse: 77  Height: 5\' 4"  (1.626 m)  Weight: 182 lb (82.555 kg)  SpO2: 98%         Assessment & Plan:  #Obstructive lung disease  - asthma like disease; you likely in flare up again  now due to lack of  taking your dulera regularly - Please take prednisone 40 mg x1 day, then 30 mg x1 day, then 20 mg x1 day, then 10 mg x1 day, and then 5 mg x1 day and stop  - continue dulera but at increased dose of 200/5, 2 puff twice daily - and make sure you take it 100% of the time  -if not responding, consider add on Therapy with spiriva or singulair  #SMoking  you need to quit asap  #LEft shoulder pain   - got to ER or urgent care; concerned you have a fracture   #Followup 8 weeks with my NP Consider spirometry at followup  Dr. Brand Males, M.D., [facility].C.P Pulmonary and Critical Care Medicine Staff Physician [facility] Pager: [phone], If no answer or between  15:00h - 7:00h: call [phone]  12/09/2013 11:53 AM

## 2013-12-09 NOTE — Discharge Instructions (Signed)
Acromioclavicular Injuries °The AC (acromioclavicular) joint is the joint in the shoulder where the collarbone (clavicle) meets the shoulder blade (scapula). The part of the shoulder blade connected to the collarbone is called the acromion. Common problems with and treatments for the AC joint are detailed below. °ARTHRITIS °Arthritis occurs when the joint has been injured and the smooth padding between the joints (cartilage) is lost. This is the wear and tear seen in most joints of the body if they have been overused. This causes the joint to produce pain and swelling which is worse with activity.  °AC JOINT SEPARATION °AC joint separation means that the ligaments connecting the acromion of the shoulder blade and collarbone have been damaged, and the two bones no longer line up. AC separations can be anywhere from mild to severe, and are "graded" depending upon which ligaments are torn and how badly they are torn. °· Grade I Injury: the least damage is done, and the AC joint still lines up. °· Grade II Injury: damage to the ligaments which reinforce the AC joint. In a Grade II injury, these ligaments are stretched but not entirely torn. When stressed, the AC joint becomes painful and unstable. °· Grade III Injury: AC and secondary ligaments are completely torn, and the collarbone is no longer attached to the shoulder blade. This results in deformity; a prominence of the end of the clavicle. °AC JOINT FRACTURE °AC joint fracture means that there has been a break in the bones of the AC joint, usually the end of the clavicle. °TREATMENT °TREATMENT OF AC ARTHRITIS °· There is currently no way to replace the cartilage damaged by arthritis. The best way to improve the condition is to decrease the activities which aggravate the problem. Application of ice to the joint helps decrease pain and soreness (inflammation). The use of non-steroidal anti-inflammatory medication is helpful. °· If less conservative measures do not  work, then cortisone shots (injections) may be used. These are anti-inflammatories; they decrease the soreness in the joint and swelling. °· If non-surgical measures fail, surgery may be recommended. The procedure is generally removal of a portion of the end of the clavicle. This is the part of the collarbone closest to your acromion which is stabilized with ligaments to the acromion of the shoulder blade. This surgery may be performed using a tube-like instrument with a light (arthroscope) for looking into a joint. It may also be performed as an open surgery through a small incision by the surgeon. Most patients will have good range of motion within 6 weeks and may return to all activity including sports by 8-12 weeks, barring complications. °TREATMENT OF AN AC SEPARATION °· The initial treatment is to decrease pain. This is best accomplished by immobilizing the arm in a sling and placing an ice pack to the shoulder for 20 to 30 minutes every 2 hours as needed. As the pain starts to subside, it is important to begin moving the fingers, wrist, elbow and eventually the shoulder in order to prevent a stiff or "frozen" shoulder. Instruction on when and how much to move the shoulder will be provided by your caregiver. The length of time needed to regain full motion and function depends on the amount or grade of the injury. Recovery from a Grade I AC separation usually takes 10 to 14 days, whereas a Grade III may take 6 to 8 weeks. °· Grade I and II separations usually do not require surgery. Even Grade III injuries usually allow return to full   activity with few restrictions. Treatment is also based on the activity demands of the injured shoulder. For example, a high level quarterback with an injured throwing arm will receive more aggressive treatment than someone with a desk job who rarely uses his/her arm for strenuous activities. In some cases, a painful lump may persist which could require a later surgery. Surgery  can be very successful, but the benefits must be weighed against the potential risks. °TREATMENT OF AN AC JOINT FRACTURE °Fracture treatment depends on the type of fracture. Sometimes a splint or sling may be all that is required. Other times surgery may be required for repair. This is more frequently the case when the ligaments supporting the clavicle are completely torn. Your caregiver will help you with these decisions and together you can decide what will be the best treatment. °HOME CARE INSTRUCTIONS  °· Apply ice to the injury for 15-20 minutes each hour while awake for 2 days. Put the ice in a plastic bag and place a towel between the bag of ice and skin. °· If a sling has been applied, wear it constantly for as long as directed by your caregiver, even at night. The sling or splint can be removed for bathing or showering or as directed. Be sure to keep the shoulder in the same place as when the sling is on. Do not lift the arm. °· If a figure-of-eight splint has been applied it should be tightened gently by another person every day. Tighten it enough to keep the shoulders held back. Allow enough room to place the index finger between the body and strap. Loosen the splint immediately if there is numbness or tingling in the hands. °· Take over-the-counter or prescription medicines for pain, discomfort or fever as directed by your caregiver. °· If you or your child has received a follow up appointment, it is very important to keep that appointment in order to avoid long term complications, chronic pain or disability. °SEEK MEDICAL CARE IF:  °· The pain is not relieved with medications. °· There is increased swelling or discoloration that continues to get worse rather than better. °· You or your child has been unable to follow up as instructed. °· There is progressive numbness and tingling in the arm, forearm or hand. °SEEK IMMEDIATE MEDICAL CARE IF:  °· The arm is numb, cold or pale. °· There is increasing pain  in the hand, forearm or fingers. °MAKE SURE YOU:  °· Understand these instructions. °· Will watch your condition. °· Will get help right away if you are not doing well or get worse. °Document Released: 10/27/2004 Document Revised: 04/11/2011 Document Reviewed: 04/21/2008 °[employer]® Patient Information ©2015 [employer]. This information is not intended to replace advice given to you by your health care provider. Make sure you discuss any questions you have with your health care provider. ° °

## 2013-12-09 NOTE — Patient Instructions (Addendum)
#  Obstructive lung disease  - asthma like disease; you likely in flare up again  now due to lack of taking your dulera regularly - Please take prednisone 40 mg x1 day, then 30 mg x1 day, then 20 mg x1 day, then 10 mg x1 day, and then 5 mg x1 day and stop  - continue dulera but at increased dose of 200/5, 2 puff twice daily - and make sure you take it 100% of the time  -if not responding, consider add on Therapy with spiriva or singulair  #SMoking  you need to quit asap  #LEft shoulder pain   - got to ER or urgent care; concerned you have a fracture   #Followup 8 weeks with my NP Consider spirometry at followup

## 2013-12-09 NOTE — ED Notes (Signed)
Declined W/C at D/C and was escorted to lobby by RN. 

## 2013-12-11 ENCOUNTER — Encounter: Payer: Self-pay | Admitting: Internal Medicine

## 2013-12-11 ENCOUNTER — Ambulatory Visit (INDEPENDENT_AMBULATORY_CARE_PROVIDER_SITE_OTHER): Payer: Medicaid Other | Admitting: Internal Medicine

## 2013-12-11 VITALS — BP 116/76 | HR 73 | Temp 98.1°F | Ht 64.0 in | Wt 181.5 lb

## 2013-12-11 DIAGNOSIS — Z720 Tobacco use: Secondary | ICD-10-CM

## 2013-12-11 DIAGNOSIS — I10 Essential (primary) hypertension: Secondary | ICD-10-CM

## 2013-12-11 DIAGNOSIS — M25519 Pain in unspecified shoulder: Secondary | ICD-10-CM | POA: Insufficient documentation

## 2013-12-11 DIAGNOSIS — M25512 Pain in left shoulder: Secondary | ICD-10-CM

## 2013-12-11 MED ORDER — DICLOFENAC SODIUM 1 % TD GEL: 2.0000 g | Freq: Four times a day (QID) | TRANSDERMAL | Status: DC

## 2013-12-11 NOTE — Patient Instructions (Signed)
It was a pleasure to see you today. We have given you a prescription for gel to apply to your shoulder four times a day as needed for pain. We have also given you a referral to sports medicine. Please return to clinic or seek medical attention if you have any new or worsening pain, shortness of breath, or other worrisome medical condition. We look forward to seeing you again as needed.  Lottie Mussel, MD  General Instructions:   Please bring your medicines with you each time you come to clinic.  Medicines may include prescription medications, over-the-counter medications, herbal remedies, eye drops, vitamins, or other pills.   Progress Toward Treatment Goals:  Treatment Goal 07/10/2013  Blood pressure at goal  Stop smoking -    Self Care Goals & Plans:  Self Care Goal 10/11/2013  Manage my medications take my medicines as prescribed; bring my medications to every visit; refill my medications on time  Monitor my health -  Eat healthy foods eat more vegetables; eat foods that are low in salt; eat baked foods instead of fried foods  Be physically active find an activity I enjoy  Stop smoking cut down the number of cigarettes smoked    No flowsheet data found.   Care Management & Community Referrals:  Referral 09/06/2012  Referrals made for care management support none needed

## 2013-12-11 NOTE — Assessment & Plan Note (Signed)
BP Readings from Last 3 Encounters:  12/11/13 116/76  12/09/13 115/73  12/09/13 136/86    Lab Results  Component Value Date   NA 141 10/11/2013   K 4.5 10/11/2013   CREATININE 0.95 10/11/2013    Assessment: Blood pressure control: controlled Progress toward BP goal:  at goal Comments: none  Plan: Medications:  continue current medications losartan 25 mg daily Educational resources provided:   Self management tools provided:   Other plans: none

## 2013-12-11 NOTE — Assessment & Plan Note (Signed)
  Assessment: Progress toward smoking cessation:  smoking more Barriers to progress toward smoking cessation:    Comments: He smokes 1/2 ppd and says he is smoking a little more now that he is in more pain. He says it is difficult to consider quitting when his wife smokes too  Plan: Instruction/counseling given:  I counseled patient on the dangers of tobacco use, advised patient to stop smoking, and reviewed strategies to maximize success. Educational resources provided:    Self management tools provided:    Medications to assist with smoking cessation:  patient says he has patches already at home Patient agreed to the following self-care plans for smoking cessation:    Other plans: continue to encourage cessation

## 2013-12-11 NOTE — Assessment & Plan Note (Addendum)
See HPI. This is secondary to mechanical fall. He says his regular prescription of norco helps and does not request any additional medication. This is likely muscular strain/sprain as there was no evidence of acute fracture or dislocation on x-ray. He feels it is generally improving. He would benefit from sports medicine referral. -voltaren gel qidprn -referral to sports medicine

## 2013-12-11 NOTE — Progress Notes (Signed)
   Subjective:    Patient ID: [name], male    DOB: Nov 11, 1960, 53 y.o.   MRN: [id number]  HPI  Mr Kimrey is a 53 year old male with HTN, asthma, chronic neck and back pain here for ED follow-up. He went to the ED yesterday for constant L shoulder pain x 1 week. He fell down wet steps (~3 steps) and landed on his shoulder. He had film with small Hill-Sachs defect indicative of previous anterior dislocation. There was no acute fracture or dislocation. He was given a sling but said he could use his home norco 10-325 rx for the pain.  He says the pain is a little better (6/10 now). He describes it as "not sharp or dull" and is unsure how to characterize it. It does not radiate. It is worse when laying down and abduction. He says the norco makes it better (3-4 10-325 pills a day and normally prescribed 120 a month). He said he tried to call to make an orthopedics follow-up Susa Day MD) with the number provided in the ED but was directed by the woman on the phone Control and instrumentation engineer?) to see your PCP.  Review of Systems  Constitutional: Positive for activity change. Negative for fever, chills, diaphoresis and fatigue.  Respiratory: Negative for shortness of breath.   Cardiovascular: Negative for chest pain.  Gastrointestinal: Negative for nausea, vomiting, abdominal pain, diarrhea and constipation.  Musculoskeletal: Positive for back pain, arthralgias and neck pain. Negative for joint swelling.  Neurological: Negative for dizziness, weakness, light-headedness, numbness and headaches.       Objective:   Physical Exam  Constitutional: He is oriented to person, place, and time. He appears well-developed and well-nourished. No distress.  HENT:  Head: Normocephalic and atraumatic.  Mouth/Throat: Oropharynx is clear and moist.  Eyes: Pupils are equal, round, and reactive to light.  Cardiovascular: Normal rate, regular rhythm, normal heart sounds and intact distal pulses.  Exam reveals no gallop  and no friction rub.   No murmur heard. Pulmonary/Chest: Effort normal and breath sounds normal. No respiratory distress. He has no wheezes.  Abdominal: Soft. Bowel sounds are normal. He exhibits no distension. There is no tenderness.  Musculoskeletal:  L arm initially in sling. R shoulder full ROM without pain. L shoulder no sign of trauma, mild pain on anterior palpation. Full ROM but generalized shoulder pain once reach 90 degrees on abduction. Pain when arm extended and pronated (Neers test without pushing down)  Neurological: He is alert and oriented to person, place, and time. No cranial nerve deficit.  Skin: He is not diaphoretic.  Vitals reviewed.         Assessment & Plan:

## 2013-12-15 NOTE — Progress Notes (Signed)
INTERNAL MEDICINE TEACHING ATTENDING ADDENDUM - Aldine Contes, MD: I personally saw and evaluated Mr. Nary in this clinic visit in conjunction with the resident, Dr. Ethelene Hal. I have discussed patient's plan of care with medical resident during this visit. I have confirmed the physical exam findings and have read and agree with the clinic note including the plan with the following addition: - BP is well controlled. Continue with current meds - Will refer patient to sports medicine for shoulder pain. C/w pain control with voltaren gel - smoking cessation education provided

## 2013-12-16 ENCOUNTER — Other Ambulatory Visit: Payer: Self-pay | Admitting: *Deleted

## 2013-12-16 MED ORDER — GABAPENTIN 300 MG PO CAPS: 300.0000 mg | ORAL_CAPSULE | Freq: Three times a day (TID) | ORAL | Status: DC

## 2013-12-16 NOTE — Addendum Note (Signed)
Addended by: Hulan Fray on: 12/16/2013 06:39 PM   Modules accepted: Orders

## 2013-12-28 DIAGNOSIS — M25512 Pain in left shoulder: Secondary | ICD-10-CM | POA: Insufficient documentation

## 2013-12-28 DIAGNOSIS — J4540 Moderate persistent asthma, uncomplicated: Secondary | ICD-10-CM | POA: Insufficient documentation

## 2014-01-20 ENCOUNTER — Encounter: Payer: Self-pay | Admitting: Internal Medicine

## 2014-01-20 ENCOUNTER — Ambulatory Visit (INDEPENDENT_AMBULATORY_CARE_PROVIDER_SITE_OTHER): Payer: Medicaid Other | Admitting: Internal Medicine

## 2014-01-20 VITALS — BP 138/80 | HR 82 | Temp 97.9°F | Ht 64.0 in | Wt 181.6 lb

## 2014-01-20 DIAGNOSIS — M5080 Other cervical disc disorders, unspecified cervical region: Secondary | ICD-10-CM

## 2014-01-20 DIAGNOSIS — M5090 Cervical disc disorder, unspecified, unspecified cervical region: Secondary | ICD-10-CM

## 2014-01-20 DIAGNOSIS — M549 Dorsalgia, unspecified: Secondary | ICD-10-CM

## 2014-01-20 DIAGNOSIS — M25512 Pain in left shoulder: Secondary | ICD-10-CM

## 2014-01-20 DIAGNOSIS — G8929 Other chronic pain: Secondary | ICD-10-CM

## 2014-01-20 DIAGNOSIS — F17210 Nicotine dependence, cigarettes, uncomplicated: Secondary | ICD-10-CM

## 2014-01-20 DIAGNOSIS — Z720 Tobacco use: Secondary | ICD-10-CM

## 2014-01-20 MED ORDER — HYDROCODONE-ACETAMINOPHEN 10-325 MG PO TABS: 1.0000 | ORAL_TABLET | Freq: Four times a day (QID) | ORAL | Status: DC | PRN

## 2014-01-20 NOTE — Assessment & Plan Note (Signed)
Pain on left shoulder since fall on November 2015. Xray negative for fracture. Has some waxing/wanning in pain, didn't go to sports medicine last time as pain seemed to improve but now has 8/10 pain. Active ROM limited to 90 degrees of shoulder extension, but passive motion is fine. Drop arm test smooth arch.   This is likely rotator cuff tendonitis or bursitis. Will refer to sports medicine again for conservative therapy such as steroid injection.   Continue voltaren gel if it helps and continue norco 10-325 mg 4 tabs daily (takes it for chronic back pain).  Asked patient to move his arm to avoid frozen shoulder.

## 2014-01-20 NOTE — Patient Instructions (Addendum)
Keep taking norco for your back pain and shoulder pain. See the sports medicine doctor for your shoulder pain. Follow up with [place] as needed. Keep taking all of your other meds.  Refilled norco #120 tabs for 2 months.   General Instructions:   Thank you for bringing your medicines today. This helps [place] keep you safe from mistakes.   Progress Toward Treatment Goals:  Treatment Goal 12/11/2013  Blood pressure at goal  Stop smoking smoking more  Prevent falls at goal    Self Care Goals & Plans:  Self Care Goal 01/20/2014  Manage my medications take my medicines as prescribed; bring my medications to every visit; refill my medications on time  Monitor my health -  Eat healthy foods eat more vegetables; eat foods that are low in salt; eat baked foods instead of fried foods  Be physically active -  Stop smoking set a quit date and stop smoking    No flowsheet data found.   Care Management & Community Referrals:  Referral 09/06/2012  Referrals made for care management support none needed

## 2014-01-20 NOTE — Assessment & Plan Note (Signed)
Pain stable. Continue norco. Refilled norco 10-325 #120 for 2 months.

## 2014-01-20 NOTE — Progress Notes (Signed)
Subjective:    Patient ID: Vernon Carpenter, male    DOB: 03-31-1960, 53 y.o.   MRN: [id number]  HPI  53 yo male with HTN, chronic asthma, chronic chest pain, chronic back pain, and sub-acute shoulder pain here for follow up.   Patient had a fall on wet steps and landed on his left shoulder in November 2015. Was seen at ED, xray left shoulder showed small hill-sacs defect likely chronic but no acute fracture or dislocation. Was seen by Dr. Ethelene Hal in clinic for follow up. That time his pain was controlled with norco. Was prescribed voltaren gel and asked to see sports medicine. Patient didn't see sports medicine as he felt better. But today he says his pain has worsened again. It's 8/10, on left shoulder/tricep area with radiation to the neck with active motion. He cannot extend his shoulder past 90 degrees. voltaren gel doesn't help much. norco controls the pain somewhat, takes 4 tabs 10-325mg  norco daily for his chronic back pain. No numbness, weakness, tingling. Not able to sleep at night because of pain on arm.   Denies any chest pain, sob, n/v, fever, chills. Saw pulm Dr. Chase Caller on 12/09/13 for chronic asthma. Was asked to keep using dulera daily and got prednisone taper. Breathing fine now. Compliant with dulera. Has f/up with pulm early next month. No other complaints.  Back pain stable.  Continues to smoke cigarette. Asked him to quit. Already has nicotine patches.  BP well controlled.  Review of Systems  Constitutional: Negative for fever, chills, activity change, appetite change and fatigue.  HENT: Negative for congestion, drooling, ear discharge, ear pain, nosebleeds, postnasal drip, rhinorrhea, sinus pressure and sore throat.   Eyes: Negative for pain, discharge and visual disturbance.  Respiratory: Negative for cough, chest tightness, shortness of breath and wheezing.   Cardiovascular: Positive for chest pain. Negative for palpitations and leg swelling.       Chronic chest  pain on left rib area. No changes.  Gastrointestinal: Negative for nausea, diarrhea, constipation, abdominal distention and anal bleeding.  Genitourinary: Negative for dysuria, urgency, hematuria, decreased urine volume and difficulty urinating.  Musculoskeletal: Positive for back pain, arthralgias and neck pain. Negative for joint swelling and neck stiffness.       Left shoulder pain, with radiation to neck and tricep area.  Skin: Negative.   Allergic/Immunologic: Negative.   Neurological: Negative for dizziness, seizures, syncope, speech difficulty, weakness, light-headedness, numbness and headaches.  Hematological: Negative.   Psychiatric/Behavioral: Negative.        Objective:   Physical Exam  Constitutional: He is oriented to person, place, and time. He appears well-developed and well-nourished. He appears distressed.  Holding his left arm with right hand.   HENT:  Head: Normocephalic and atraumatic.  Right Ear: External ear normal.  Left Ear: External ear normal.  Nose: Nose normal.  Mouth/Throat: Oropharynx is clear and moist.  Eyes: Conjunctivae and EOM are normal. Pupils are equal, round, and reactive to light. Right eye exhibits no discharge. Left eye exhibits no discharge. No scleral icterus.  Neck: Normal range of motion. Neck supple. No JVD present. No thyromegaly present.  Cardiovascular: Normal rate, regular rhythm, S1 normal, S2 normal, normal heart sounds and intact distal pulses.  Exam reveals no gallop and no friction rub.   No murmur heard. Pulmonary/Chest: Effort normal and breath sounds normal. No respiratory distress. He has no wheezes. He has no rales. He exhibits no tenderness.  Abdominal: Soft. Bowel sounds are normal. He exhibits  no distension and no mass. There is no tenderness. There is no rebound and no guarding.  Musculoskeletal: He exhibits no edema or tenderness.       Right shoulder: Normal.       Left shoulder: He exhibits decreased range of motion.  He exhibits no tenderness, no bony tenderness, no swelling, no effusion and no crepitus.  Left shoulder: cannot do active movement past 90 degrees. Able to move past 90 degrees with assistance/passively. Smooth arc on drop test. Has pain on "empty can" test. No TTP on shoulder joint or musculature.   Strength 5/5, ROM limited on left shoulder due to pain.  Lymphadenopathy:    He has no cervical adenopathy.  Neurological: He is alert and oriented to person, place, and time. He has normal strength and normal reflexes. No cranial nerve deficit or sensory deficit. GCS eye subscore is 4. GCS verbal subscore is 5. GCS motor subscore is 6.  Skin: No rash noted. He is not diaphoretic. No erythema. No pallor.  Psychiatric: He has a normal mood and affect. His behavior is normal.          Assessment & Plan:  See problem based a&p.

## 2014-01-20 NOTE — Assessment & Plan Note (Signed)
Asked him to quit. Has nicotine patch already.

## 2014-01-20 NOTE — Assessment & Plan Note (Signed)
Chronic back pain stable. Refilled norco 10-325mg  #120 tab for 2 months.

## 2014-01-21 NOTE — Progress Notes (Signed)
Internal Medicine Clinic Attending  I saw and evaluated the patient.  I personally confirmed the key portions of the history and exam documented by Dr. Ahmed and I reviewed pertinent patient test results.  The assessment, diagnosis, and plan were formulated together and I agree with the documentation in the resident's note.   

## 2014-02-03 ENCOUNTER — Encounter: Payer: Self-pay | Admitting: Adult Health

## 2014-02-03 ENCOUNTER — Ambulatory Visit (INDEPENDENT_AMBULATORY_CARE_PROVIDER_SITE_OTHER): Payer: Medicaid Other | Admitting: Adult Health

## 2014-02-03 VITALS — BP 120/78 | HR 87 | Temp 97.7°F | Ht 64.0 in | Wt 182.4 lb

## 2014-02-03 DIAGNOSIS — J449 Chronic obstructive pulmonary disease, unspecified: Secondary | ICD-10-CM

## 2014-02-03 NOTE — Patient Instructions (Signed)
Continue on Dulera 2 puffs twice daily, rinse after use. Continue to work on stopping smoking Follow Dr. Chase Caller in 3 months and as needed

## 2014-02-03 NOTE — Progress Notes (Signed)
Subjective:    Patient ID: Vernon Carpenter, male    DOB: May 29, 1960, 54 y.o.   MRN: [id number]  HPI  54 year old male. VEry poor historian REports   Chronic cough: x several years. Insidious onset. Progressive. Moderate to severe in intensity. No clear cut aggravating factors. Improved by nebs prn and lying flat. Quality is dry cough. It is associated with ACE inhibitor intake and an asthma diagnosis and smoking hx.  There is associted dyspnea  Dyspnea: insidious onset x few years. Progressive. Exertional. Brought on with exertion of walking hallways. RElieved by rest but also present at rest.   Labs   Admitted 05/15/13 with syncope: cardiac issues ruled out including troponin and holter  fElt to be cardiac syncope and referred here  Pulmonary function test 06/13/2013 shows post bronchodilator FEV1 at 2.33 L/79%. FVC of 2.3 L/88% [PRe bronchodilator FEV1 was 2 L/68%] . THere was significant BD response of approx 16%. Post bd  Ratio 70. Total lung capacity 6.25 L 114% predicted and DLCO 24/107% predicted  CXR 05/15/13 - clear  CBC - hgb 15gm% 05/15/13   Has no known CAD In 2012 had calcium score of 0 and no CAD In 2014 had normal myovue except for diaphragmatic attenuation and EF 60%    reports that he has been smoking Cigarettes.  He has a  Age 36 but quit for 3 years in interim. Currently smoking. Always smoked < 1/2 ppd.     08/08/13 Follow up  Returns for COPD follow up Reports breathing is doing well since ov.   Still taking Dulera BID, will need an rx No flare in cough or wheezing .  Discussed smoking cessation  Patient denies any hemoptysis, orthopnea, PND, or leg swelling.  No increased SABA use.   OV 12/09/2013  Chief Complaint  Patient presents with  . Follow-up    Pt c/o wheezing. Pt states he feels his SOB has worsened since last OV. Pt also c/o prod cough with clear mucus. Pt c/o chest tightness under left breast with actiivty     Obstructive lung disease  followup: 54 year old male.  His DLCO is normal and he had 16%  bronchodilator response on PFT May 2015. His smoking history is limited. No emphysema features on CXR in 2015; also it is clear. Therefore evidence favors asthma.  The features do NOT fit in with cOPD. Overall he is better since starting dulera but says over the last month or so he has declined and having more shortness of breath, cough and wheezing. However his compliance is only 50%. There is no fever or other symptoms.   Smoking:  reports that he has been smoking Cigarettes.  He has a 6 pack-year smoking history. He has never used smokeless tobacco.   Past medical history: A week ago he fell down the stairs in the rain he did the stairs broke. Since then his left shoulder is in pain and is unable to abduct or use this. He has not sought any medical treatment  02/03/2014 Follow up  Returns for a  2 month Asthma follow up. Reports breathing is unchanged since last ov.  Does feel that cough and wheezing are less. Last visit. He was started on [facility] and given a prednisone taper.  Working on decreasing his smoking.  no new complaints. He denies any increased cough or wheezing. He denies hemoptysis, chest pain, orthopnea, PND or leg swelling.  Review of Systems  Constitutional:   No  weight loss,  night sweats,  Fevers, chills, fatigue, or  lassitude.  HEENT:   No headaches,  Difficulty swallowing,  Tooth/dental problems, or  Sore throat,                No sneezing, itching, ear ache,  +nasal congestion, post nasal drip,   CV:  No chest pain,  Orthopnea, PND, swelling in lower extremities, anasarca, dizziness, palpitations, syncope.   GI  No heartburn, indigestion, abdominal pain, nausea, vomiting, diarrhea, change in bowel habits, loss of appetite, bloody stools.   Resp:  .  No chest wall deformity  Skin: no rash or lesions.  GU: no dysuria, change in color of urine, no urgency or frequency.  No flank pain, no hematuria    MS:  No joint pain or swelling.  No decreased range of motion.  No back pain.  Psych:  No change in mood or affect. No depression or anxiety.  No memory loss.          Objective:   Physical Exam  GEN: A/Ox3; pleasant , NAD, well nourished   HEENT:  [place]/AT,  EACs-clear, TMs-wnl, NOSE-clear, poor dentition,  THROAT-clear, no lesions, no postnasal drip or exudate noted.   NECK:  Supple w/ fair ROM; no JVD; normal carotid impulses w/o bruits; no thyromegaly or nodules palpated; no lymphadenopathy.  RESP  Decreased BS in bases w/o, wheezes/ rales/ or rhonchi.no accessory muscle use, no dullness to percussion  CARD:  RRR, no m/r/g  , no peripheral edema, pulses intact, no cyanosis or clubbing.  GI:   Soft & nt; nml bowel sounds; no organomegaly or masses detected.  Musco: Warm bil, no deformities or joint swelling noted.   Neuro: alert, no focal deficits noted.    Skin: Warm, no lesions or rashes          Assessment & Plan:

## 2014-02-03 NOTE — Assessment & Plan Note (Signed)
Recent flare now resolving  Smoking cessation is key   Plan  Continue on Dulera 2 puffs twice daily, rinse after use. Continue to work on stopping smoking Follow Dr. Chase Caller in 3 months and as needed

## 2014-02-05 ENCOUNTER — Ambulatory Visit (INDEPENDENT_AMBULATORY_CARE_PROVIDER_SITE_OTHER): Payer: Medicaid Other | Admitting: Sports Medicine

## 2014-02-05 ENCOUNTER — Encounter: Payer: Self-pay | Admitting: Sports Medicine

## 2014-02-05 VITALS — BP 130/84 | Ht 64.0 in | Wt 182.0 lb

## 2014-02-05 DIAGNOSIS — M25512 Pain in left shoulder: Secondary | ICD-10-CM

## 2014-02-05 NOTE — Assessment & Plan Note (Addendum)
Ultrasound findings demonstrate tear in supraspinatus. History, physical exam findings, and ultrasound findings concerning for a large full-thickness rotator cuff tear.  1) MRI to rule out rotator cuff tear.  2) Recommend shoulder pendulum excercises to prevent adhesive capsulitis 3) Phone follow up after results return to discuss further management (potenitally surgery).

## 2014-02-05 NOTE — Progress Notes (Signed)
   Subjective:    Patient ID: Vernon Carpenter, male    DOB: 09-12-1960, 54 y.o.   MRN: [id number]  HPI Vernon Carpenter is a 54 year old male who presents for evaluation of a  2 month history of left shoulder pain (dominant arm). His pain began when he fell down his stairs 2 months ago. He landed on his left shoulder, but does not believe he dislocated it. He was evaluated in the ER and the x-rays did not show any fractures. He believes the doctor may have told him he had a "bone bruise." He was fitted for a sling in the ER and he wore the sling for approximately 2 weeks. His pain has remained constant since the initial injury. The pain is concentrated at the anterior and lateral aspect of his shoulder. He reports difficulty with shoulder abduction, forward flexion and extension. He denies any numbness or tingling into his fingers or along his shoulder. He regularly takes norco for his back pain, but does not believe this is helping his shoulder. He denies previous trauma to the shoulder.  Review of Systems  Gen: No malaise, chills or weight loss.  CV: No chest pain. No SOB     Objective:   Physical Exam Well developed, well-nourished. No acute distress. Vital signs reviewed.  Left Shoulder: No swelling noted. No atrophy. Tender to palpation along anterior and lateral shoulder. Shoulder abduction range of motion limited to 45 degrees actively. Passively he can achieve 120 degrees.. Active forward flexion of shoulder limited to 50 degrees. Unable to extend left arm behind back. Empty Can test positive. Strength reduced in left shoulder partially due to pain. Neurovascularly intact distally.   Right Shoulder: No swelling noted. No tenderness to palpation. Normal range of motion in all planes. Normal strength. Neurovascularly intact.   X-ray left shoulder (12/09/13):  IMPRESSION: Small Hill-Sachs defect which is probably chronic. No acute fracture or dislocation is appreciable. No appreciable  arthropathic change.  Very Limited MSK ultrasound of the right shoulder was performed. Study was limited primarily due to pain. Supraspinatus was not well visualized at the footprint of the greater tuberosity. Significant surrounding hypoechoic changes well. Findings concerning for significant rotator cuff tear.     Assessment & Plan:

## 2014-02-11 ENCOUNTER — Ambulatory Visit
Admission: RE | Admit: 2014-02-11 | Discharge: 2014-02-11 | Disposition: A | Discharge status: Home / Self Care | Payer: Medicaid Other | Source: Ambulatory Visit | Attending: Sports Medicine | Admitting: Sports Medicine

## 2014-02-11 DIAGNOSIS — M25512 Pain in left shoulder: Secondary | ICD-10-CM

## 2014-02-12 ENCOUNTER — Other Ambulatory Visit: Payer: Self-pay | Admitting: Internal Medicine

## 2014-02-13 ENCOUNTER — Telehealth: Payer: Self-pay | Admitting: Sports Medicine

## 2014-02-13 ENCOUNTER — Other Ambulatory Visit: Payer: Self-pay | Admitting: *Deleted

## 2014-02-13 DIAGNOSIS — M75102 Unspecified rotator cuff tear or rupture of left shoulder, not specified as traumatic: Secondary | ICD-10-CM

## 2014-02-13 NOTE — Telephone Encounter (Signed)
I spoke with the patient on the phone today after reviewing the MRI of his left shoulder. 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 No atrophy is seen. 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 It is isolated only to this portion of his rotator cuff. No evidence of infraspinatus atrophy. At this point I have recommended surgical consultation with Dr. Mardelle Matte. Patient is in agreement with this. Further workup and treatment will be per Dr. Luanna Cole discretion. Patient will follow-up with me when necessary.

## 2014-02-17 ENCOUNTER — Telehealth: Payer: Self-pay | Admitting: *Deleted

## 2014-02-17 NOTE — Telephone Encounter (Signed)
Called and made appt with Dr. Mardelle Matte for 02/19/14 @ 9am, faxing our notes over.  Called pt to make aware of this appt.

## 2014-02-20 ENCOUNTER — Other Ambulatory Visit: Payer: Self-pay | Admitting: Orthopedic Surgery

## 2014-02-26 ENCOUNTER — Other Ambulatory Visit: Payer: Self-pay | Admitting: Internal Medicine

## 2014-02-26 DIAGNOSIS — M549 Dorsalgia, unspecified: Secondary | ICD-10-CM

## 2014-03-03 ENCOUNTER — Encounter: Payer: Self-pay | Admitting: Adult Health

## 2014-03-03 ENCOUNTER — Ambulatory Visit (INDEPENDENT_AMBULATORY_CARE_PROVIDER_SITE_OTHER): Payer: Self-pay | Admitting: Adult Health

## 2014-03-03 VITALS — BP 112/80 | HR 81 | Temp 97.4°F | Resp 18 | Ht 64.0 in | Wt 180.2 lb

## 2014-03-03 DIAGNOSIS — Z720 Tobacco use: Secondary | ICD-10-CM

## 2014-03-03 NOTE — Progress Notes (Signed)
   Subjective:    Patient ID: Vernon Carpenter, male    DOB: Oct 02, 1960, 54 y.o.   MRN: [id number]  HPI  54 year old male. Chronic cough: x several years. Insidious onset. Progressive. Moderate to severe in intensity. No clear cut aggravating factors. Improved by nebs prn and lying flat. Quality is dry cough. It is associated with ACE inhibitor intake and an asthma diagnosis and smoking hx.  There is associted dyspnea  Dyspnea: insidious onset x few years. Progressive. Exertional. Brought on with exertion of walking hallways. RElieved by rest but also present at rest.   Labs   Admitted 05/15/13 with syncope: cardiac issues ruled out including troponin and holter  fElt to be cardiac syncope and referred here  Pulmonary function test 06/13/2013 shows post bronchodilator FEV1 at 2.33 L/79%. FVC of 2.3 L/88% [PRe bronchodilator FEV1 was 2 L/68%] . THere was significant BD response of approx 16%. Post bd  Ratio 70. Total lung capacity 6.25 L 114% predicted and DLCO 24/107% predicted  CXR 05/15/13 - clear  CBC - hgb 15gm% 05/15/13   Has no known CAD In 2012 had calcium score of 0 and no CAD In 2014 had normal myovue except for diaphragmatic attenuation and EF 60%    reports that he has been smoking Cigarettes.  He has a  Age 60 but quit for 3 years in interim. Currently smoking. Always smoked < 1/2 ppd.      03/03/2014 Research Visit/Screening Visit  Screening visit for [employer] Asthma study: Subject returns to complete requirements for screening. He has demonstrated 15% reversibility on PFT's after 3 tries. His last CXR was April 2015, within 12 months of requirement. All labs have been completed and we will finish ECG today   Unfortunately pt is active smoker, that excludes him from study.  No charge for visit . Pt explained this by research CMA .  Not seen by myself.

## 2014-03-03 NOTE — Progress Notes (Signed)
Screening visit for [employer] Asthma study:  Subject returns to complete requirements for screening. He has demonstrated 15% reversibility on PFT's after 3 tries.  His last CXR was April 2015, within 12 months of requirement.  All labs have been completed and we will finish ECG today and see Vernon Edison, NP for exam level 3.  Subject will return next week for Randomization after final review.

## 2014-03-03 NOTE — Assessment & Plan Note (Signed)
Excludes from research study.

## 2014-03-10 NOTE — Progress Notes (Signed)
Patient ID: FRANCE NOYCE, male   DOB: 07/22/1960, 54 y.o.   MRN: [id number]   VALON GLASSCOCK is a 53 y.o. .man with PMH significant for asthma, HTN, and cervical disc disease with chronic neck pain who presents for hospital follow up.  Patient was admitted to the hospital on 05/15/2013 for an episode of syncope. This was in the setting of severe coughing episode and ultimately thought to be Vasovagal  EKG was within normal limits. Orthostatics were negative. No events seen on telemetry. Troponins were trended and negative x3. They felt the most likely etiology was vasovagal syncope. Patient was instructed to followup with me  in for evaluation for a Holter monitor to rule out arrhythmia as a cause.  Blood pressure today is low at his primaries office visit recently and lisinopril decreased to 10 mg  Patient reports he still feels a little bit lightheaded. It is constant, not associated with activity or any of his medicines. He has been eating and drinking normally. No weight changes. No polyuria. No fevers or chills. No nausea, vomiting, diarrhea.  Last seen in 2014  Has no known CAD  In 2012 had calcium score of 0 and no CAD  In 2014 had normal myovue except for diaphragmatic attenuation and EF 60%  He is unemployed He has a chronic bad cough with ? Reflux especially when is partials are not in  Has a nebulizer at home.  Cannot afford nicotine patches No chest pain He does not remember events surrounding hospitalization   Seeing pulmonary a lot for bronchitis/asthma Recent left shoulder arthroscopy with Dr Mardelle Matte Will have surgery this Friday  Ok from cardiac perspective     ROS: Denies fever, malais, weight loss, blurry vision, decreased visual acuity, cough, sputum, SOB, hemoptysis, pleuritic pain, palpitaitons, heartburn, abdominal pain, melena, lower extremity edema, claudication, or rash.  All other systems reviewed and negative  General: Affect appropriate Chronically ill white male   HEENT: poor dentition  Neck supple with no adenopathy JVP normal no bruits no thyromegaly Lungs Diffuse rhonchi and wheezing  With increased dead space/ air trapping and good diaphragmatic motion Heart:  S1/S2 no murmur, no rub, gallop or click PMI normal Abdomen: benighn, BS positve, no tenderness, no AAA no bruit.  No HSM or HJR Distal pulses intact with no bruits No edema Neuro non-focal Skin warm and dry No muscular weakness   Current Outpatient Prescriptions  Medication Sig Dispense Refill  . albuterol (PROVENTIL) (2.5 MG/3ML) 0.083% nebulizer solution Take 3 mLs (2.5 mg total) by nebulization every 6 (six) hours as needed for wheezing. 75 mL 12  . aspirin 81 MG tablet Take 81 mg by mouth daily.    . diclofenac sodium (VOLTAREN) 1 % GEL Apply 2 g topically 4 (four) times daily. 100 g 1  . gabapentin (NEURONTIN) 300 MG capsule Take 1 capsule (300 mg total) by mouth 3 (three) times daily. 90 capsule 3  . HYDROcodone-acetaminophen (NORCO) 10-325 MG per tablet Take 1 tablet by mouth every 6 (six) hours as needed for moderate pain. 120 tablet 0  . losartan (COZAAR) 25 MG tablet Take 1 tablet (25 mg total) by mouth daily. 30 tablet 5  . lovastatin (MEVACOR) 20 MG tablet Take 1 tablet (20 mg total) by mouth daily. 30 tablet 11  . mometasone-formoterol (DULERA) 200-5 MCG/ACT AERO Inhale 2 puffs into the lungs 2 (two) times daily. 1 Inhaler 5  . nicotine (NICODERM CQ - DOSED IN MG/24 HOURS) 14 mg/24hr patch Place  1 patch (14 mg total) onto the skin daily. 30 patch 11  . pantoprazole (PROTONIX) 40 MG tablet TAKE 1 TABLET BY MOUTH DAILY. 30 tablet 3  . ranitidine (ZANTAC) 150 MG tablet Take 1 tablet (150 mg total) by mouth 2 (two) times daily. 60 tablet 11   No current facility-administered medications for this visit.    Allergies  Review of patient's allergies indicates no known allergies.  Electrocardiogram:  SR rate 81 PR short 106  QT normal 357  Normal ST segments  06/10/13    Assessment and Plan

## 2014-03-11 ENCOUNTER — Ambulatory Visit (INDEPENDENT_AMBULATORY_CARE_PROVIDER_SITE_OTHER): Payer: Medicaid Other | Admitting: Cardiovascular Disease

## 2014-03-11 ENCOUNTER — Encounter: Payer: Self-pay | Admitting: Cardiovascular Disease

## 2014-03-11 VITALS — BP 120/72 | HR 92 | Ht 64.0 in | Wt 182.1 lb

## 2014-03-11 DIAGNOSIS — I10 Essential (primary) hypertension: Secondary | ICD-10-CM

## 2014-03-11 DIAGNOSIS — R072 Precordial pain: Secondary | ICD-10-CM

## 2014-03-11 DIAGNOSIS — R55 Syncope and collapse: Secondary | ICD-10-CM

## 2014-03-11 DIAGNOSIS — J449 Chronic obstructive pulmonary disease, unspecified: Secondary | ICD-10-CM

## 2014-03-11 DIAGNOSIS — J4489 Other specified chronic obstructive pulmonary disease: Secondary | ICD-10-CM

## 2014-03-11 NOTE — Assessment & Plan Note (Signed)
Improved no active wheezing  Yearly CXR

## 2014-03-11 NOTE — Assessment & Plan Note (Signed)
Well controlled.  Continue current medications and low sodium Dash type diet.    

## 2014-03-11 NOTE — Assessment & Plan Note (Signed)
Non recurrent  Thought to be vasovagal no structural heart issue or arrhythmia noted

## 2014-03-11 NOTE — Assessment & Plan Note (Signed)
Multiple normal stress tests Calcium score has been 0 despite smoking  Ok for shoulder surgery on Friday

## 2014-03-11 NOTE — Patient Instructions (Signed)
Your physician wants you to follow-up in:  6 MONTHS WITH DR NISHAN  You will receive a reminder letter in the mail two months in advance. If you don't receive a letter, please call our office to schedule the follow-up appointment. Your physician recommends that you continue on your current medications as directed. Please refer to the Current Medication list given to you today. 

## 2014-03-13 ENCOUNTER — Other Ambulatory Visit: Payer: Self-pay | Admitting: Internal Medicine

## 2014-03-13 DIAGNOSIS — M5090 Cervical disc disorder, unspecified, unspecified cervical region: Secondary | ICD-10-CM

## 2014-03-13 DIAGNOSIS — M549 Dorsalgia, unspecified: Secondary | ICD-10-CM

## 2014-03-17 ENCOUNTER — Encounter (HOSPITAL_BASED_OUTPATIENT_CLINIC_OR_DEPARTMENT_OTHER): Payer: Self-pay | Admitting: *Deleted

## 2014-03-17 NOTE — Progress Notes (Signed)
Pt had negative stress echo-5/15-sleep study 2014-did not need cpap-will come in for bmet-to bring all meds and overnight bag dos

## 2014-03-18 ENCOUNTER — Encounter (HOSPITAL_BASED_OUTPATIENT_CLINIC_OR_DEPARTMENT_OTHER)
Admission: RE | Admit: 2014-03-18 | Discharge: 2014-03-18 | Disposition: A | Discharge status: Home / Self Care | Payer: Medicaid Other | Source: Ambulatory Visit | Attending: Orthopedic Surgery | Admitting: Orthopedic Surgery

## 2014-03-18 ENCOUNTER — Other Ambulatory Visit: Payer: Self-pay | Admitting: *Deleted

## 2014-03-18 DIAGNOSIS — J449 Chronic obstructive pulmonary disease, unspecified: Secondary | ICD-10-CM | POA: Diagnosis not present

## 2014-03-18 DIAGNOSIS — W109XXA Fall (on) (from) unspecified stairs and steps, initial encounter: Secondary | ICD-10-CM | POA: Diagnosis not present

## 2014-03-18 DIAGNOSIS — I10 Essential (primary) hypertension: Secondary | ICD-10-CM | POA: Diagnosis not present

## 2014-03-18 DIAGNOSIS — S46012A Strain of muscle(s) and tendon(s) of the rotator cuff of left shoulder, initial encounter: Secondary | ICD-10-CM | POA: Diagnosis not present

## 2014-03-18 DIAGNOSIS — Z9889 Other specified postprocedural states: Secondary | ICD-10-CM | POA: Diagnosis not present

## 2014-03-18 DIAGNOSIS — Y999 Unspecified external cause status: Secondary | ICD-10-CM | POA: Diagnosis not present

## 2014-03-18 DIAGNOSIS — Z79891 Long term (current) use of opiate analgesic: Secondary | ICD-10-CM | POA: Diagnosis not present

## 2014-03-18 DIAGNOSIS — J45909 Unspecified asthma, uncomplicated: Secondary | ICD-10-CM | POA: Diagnosis not present

## 2014-03-18 DIAGNOSIS — J9620 Acute and chronic respiratory failure, unspecified whether with hypoxia or hypercapnia: Secondary | ICD-10-CM | POA: Diagnosis not present

## 2014-03-18 DIAGNOSIS — Y929 Unspecified place or not applicable: Secondary | ICD-10-CM | POA: Diagnosis not present

## 2014-03-18 DIAGNOSIS — F17210 Nicotine dependence, cigarettes, uncomplicated: Secondary | ICD-10-CM | POA: Diagnosis not present

## 2014-03-18 DIAGNOSIS — Z79899 Other long term (current) drug therapy: Secondary | ICD-10-CM | POA: Diagnosis not present

## 2014-03-18 DIAGNOSIS — E780 Pure hypercholesterolemia: Secondary | ICD-10-CM | POA: Diagnosis not present

## 2014-03-18 DIAGNOSIS — M549 Dorsalgia, unspecified: Secondary | ICD-10-CM

## 2014-03-18 DIAGNOSIS — M25512 Pain in left shoulder: Secondary | ICD-10-CM | POA: Diagnosis present

## 2014-03-18 DIAGNOSIS — M4690 Unspecified inflammatory spondylopathy, site unspecified: Secondary | ICD-10-CM | POA: Diagnosis not present

## 2014-03-18 DIAGNOSIS — M545 Low back pain: Secondary | ICD-10-CM | POA: Diagnosis not present

## 2014-03-18 DIAGNOSIS — Z7982 Long term (current) use of aspirin: Secondary | ICD-10-CM | POA: Diagnosis not present

## 2014-03-18 DIAGNOSIS — M7542 Impingement syndrome of left shoulder: Secondary | ICD-10-CM | POA: Diagnosis not present

## 2014-03-18 DIAGNOSIS — G8929 Other chronic pain: Secondary | ICD-10-CM | POA: Diagnosis not present

## 2014-03-18 DIAGNOSIS — M94212 Chondromalacia, left shoulder: Secondary | ICD-10-CM | POA: Diagnosis not present

## 2014-03-18 DIAGNOSIS — M5090 Cervical disc disorder, unspecified, unspecified cervical region: Secondary | ICD-10-CM

## 2014-03-18 DIAGNOSIS — Y939 Activity, unspecified: Secondary | ICD-10-CM | POA: Diagnosis not present

## 2014-03-18 DIAGNOSIS — K219 Gastro-esophageal reflux disease without esophagitis: Secondary | ICD-10-CM | POA: Diagnosis not present

## 2014-03-18 DIAGNOSIS — Z7951 Long term (current) use of inhaled steroids: Secondary | ICD-10-CM | POA: Diagnosis not present

## 2014-03-18 LAB — BASIC METABOLIC PANEL
ANION GAP: 3 — AB (ref 5–15)
BUN: 15 mg/dL (ref 6–23)
CALCIUM: 9.5 mg/dL (ref 8.4–10.5)
CO2: 32 mmol/L (ref 19–32)
Chloride: 100 mmol/L (ref 96–112)
Creatinine, Ser: 1.05 mg/dL (ref 0.50–1.35)
GFR, EST NON AFRICAN AMERICAN: 79 mL/min — AB (ref >90)
Glucose, Bld: 131 mg/dL — ABNORMAL HIGH (ref 70–99)
Potassium: 4.2 mmol/L (ref 3.5–5.1)
Sodium: 135 mmol/L (ref 135–145)

## 2014-03-18 NOTE — Telephone Encounter (Signed)
Pt called in for refill.  Not due until 2/29 He will call back

## 2014-03-19 ENCOUNTER — Encounter (HOSPITAL_BASED_OUTPATIENT_CLINIC_OR_DEPARTMENT_OTHER): Payer: Self-pay | Admitting: *Deleted

## 2014-03-21 ENCOUNTER — Ambulatory Visit (HOSPITAL_BASED_OUTPATIENT_CLINIC_OR_DEPARTMENT_OTHER)
Admission: RE | Admit: 2014-03-21 | Discharge: 2014-03-21 | Disposition: A | Discharge status: Home / Self Care | Payer: Medicaid Other | Source: Ambulatory Visit | Attending: Orthopedic Surgery | Admitting: Orthopedic Surgery

## 2014-03-21 ENCOUNTER — Encounter (HOSPITAL_BASED_OUTPATIENT_CLINIC_OR_DEPARTMENT_OTHER)
Admission: RE | Disposition: A | Discharge status: Home / Self Care | Payer: Self-pay | Source: Ambulatory Visit | Attending: Orthopedic Surgery

## 2014-03-21 ENCOUNTER — Ambulatory Visit (HOSPITAL_BASED_OUTPATIENT_CLINIC_OR_DEPARTMENT_OTHER): Payer: Medicaid Other | Admitting: Certified Registered"

## 2014-03-21 ENCOUNTER — Encounter (HOSPITAL_BASED_OUTPATIENT_CLINIC_OR_DEPARTMENT_OTHER): Payer: Self-pay | Admitting: Certified Registered"

## 2014-03-21 DIAGNOSIS — J45909 Unspecified asthma, uncomplicated: Secondary | ICD-10-CM | POA: Insufficient documentation

## 2014-03-21 DIAGNOSIS — Z9889 Other specified postprocedural states: Secondary | ICD-10-CM | POA: Insufficient documentation

## 2014-03-21 DIAGNOSIS — J9620 Acute and chronic respiratory failure, unspecified whether with hypoxia or hypercapnia: Secondary | ICD-10-CM | POA: Diagnosis not present

## 2014-03-21 DIAGNOSIS — F17210 Nicotine dependence, cigarettes, uncomplicated: Secondary | ICD-10-CM | POA: Insufficient documentation

## 2014-03-21 DIAGNOSIS — G8929 Other chronic pain: Secondary | ICD-10-CM | POA: Insufficient documentation

## 2014-03-21 DIAGNOSIS — J449 Chronic obstructive pulmonary disease, unspecified: Secondary | ICD-10-CM | POA: Insufficient documentation

## 2014-03-21 DIAGNOSIS — Y939 Activity, unspecified: Secondary | ICD-10-CM | POA: Insufficient documentation

## 2014-03-21 DIAGNOSIS — Y929 Unspecified place or not applicable: Secondary | ICD-10-CM | POA: Insufficient documentation

## 2014-03-21 DIAGNOSIS — M4690 Unspecified inflammatory spondylopathy, site unspecified: Secondary | ICD-10-CM | POA: Insufficient documentation

## 2014-03-21 DIAGNOSIS — S46012A Strain of muscle(s) and tendon(s) of the rotator cuff of left shoulder, initial encounter: Secondary | ICD-10-CM | POA: Diagnosis not present

## 2014-03-21 DIAGNOSIS — W109XXA Fall (on) (from) unspecified stairs and steps, initial encounter: Secondary | ICD-10-CM | POA: Insufficient documentation

## 2014-03-21 DIAGNOSIS — M545 Low back pain: Secondary | ICD-10-CM | POA: Insufficient documentation

## 2014-03-21 DIAGNOSIS — Z79891 Long term (current) use of opiate analgesic: Secondary | ICD-10-CM | POA: Insufficient documentation

## 2014-03-21 DIAGNOSIS — M75122 Complete rotator cuff tear or rupture of left shoulder, not specified as traumatic: Secondary | ICD-10-CM

## 2014-03-21 DIAGNOSIS — M7542 Impingement syndrome of left shoulder: Secondary | ICD-10-CM | POA: Diagnosis not present

## 2014-03-21 DIAGNOSIS — M94212 Chondromalacia, left shoulder: Secondary | ICD-10-CM | POA: Diagnosis not present

## 2014-03-21 DIAGNOSIS — Z79899 Other long term (current) drug therapy: Secondary | ICD-10-CM | POA: Insufficient documentation

## 2014-03-21 DIAGNOSIS — I10 Essential (primary) hypertension: Secondary | ICD-10-CM | POA: Insufficient documentation

## 2014-03-21 DIAGNOSIS — Z7982 Long term (current) use of aspirin: Secondary | ICD-10-CM | POA: Insufficient documentation

## 2014-03-21 DIAGNOSIS — K219 Gastro-esophageal reflux disease without esophagitis: Secondary | ICD-10-CM | POA: Insufficient documentation

## 2014-03-21 DIAGNOSIS — E780 Pure hypercholesterolemia: Secondary | ICD-10-CM | POA: Insufficient documentation

## 2014-03-21 DIAGNOSIS — Y999 Unspecified external cause status: Secondary | ICD-10-CM | POA: Insufficient documentation

## 2014-03-21 DIAGNOSIS — Z7951 Long term (current) use of inhaled steroids: Secondary | ICD-10-CM | POA: Insufficient documentation

## 2014-03-21 HISTORY — DX: Complete rotator cuff tear or rupture of left shoulder, not specified as traumatic: M75.122

## 2014-03-21 HISTORY — PX: SHOULDER ACROMIOPLASTY: SHX6093

## 2014-03-21 HISTORY — DX: Presence of dental prosthetic device (complete) (partial): Z97.2

## 2014-03-21 HISTORY — PX: SHOULDER ARTHROSCOPY WITH ROTATOR CUFF REPAIR AND SUBACROMIAL DECOMPRESSION: SHX5686

## 2014-03-21 LAB — POCT HEMOGLOBIN-HEMACUE: HEMOGLOBIN: 17.6 g/dL — AB (ref 13.0–17.0)

## 2014-03-21 SURGERY — SHOULDER ARTHROSCOPY WITH ROTATOR CUFF REPAIR AND SUBACROMIAL DECOMPRESSION
Anesthesia: General | Site: Shoulder | Laterality: Left

## 2014-03-21 MED ORDER — DEXAMETHASONE SODIUM PHOSPHATE 4 MG/ML IJ SOLN
INTRAMUSCULAR | Status: DC | PRN
  Administered 2014-03-21: 10 mg via INTRAVENOUS

## 2014-03-21 MED ORDER — LACTATED RINGERS IV SOLN
INTRAVENOUS | Status: DC
  Administered 2014-03-21 (×2): via INTRAVENOUS

## 2014-03-21 MED ORDER — KETOROLAC TROMETHAMINE 30 MG/ML IJ SOLN: 30.0000 mg | Freq: Once | INTRAMUSCULAR | Status: DC | PRN

## 2014-03-21 MED ORDER — DIAZEPAM 5 MG PO TABS: 5.0000 mg | ORAL_TABLET | Freq: Four times a day (QID) | ORAL | Status: DC | PRN

## 2014-03-21 MED ORDER — FENTANYL CITRATE 0.05 MG/ML IJ SOLN
INTRAMUSCULAR | Status: AC
  Filled 2014-03-21: qty 6

## 2014-03-21 MED ORDER — CEFAZOLIN SODIUM-DEXTROSE 2-3 GM-% IV SOLR
INTRAVENOUS | Status: AC
  Filled 2014-03-21: qty 50

## 2014-03-21 MED ORDER — OXYCODONE-ACETAMINOPHEN 10-325 MG PO TABS: 1.0000 | ORAL_TABLET | Freq: Four times a day (QID) | ORAL | Status: DC | PRN

## 2014-03-21 MED ORDER — MIDAZOLAM HCL 5 MG/5ML IJ SOLN
INTRAMUSCULAR | Status: DC | PRN
  Administered 2014-03-21: 1 mg via INTRAVENOUS

## 2014-03-21 MED ORDER — ONDANSETRON HCL 4 MG/2ML IJ SOLN
INTRAMUSCULAR | Status: DC | PRN
  Administered 2014-03-21: 4 mg via INTRAVENOUS

## 2014-03-21 MED ORDER — PROPOFOL 10 MG/ML IV BOLUS
INTRAVENOUS | Status: DC | PRN
Start: 2014-03-21 — End: 2014-03-21
  Administered 2014-03-21: 150 mg via INTRAVENOUS

## 2014-03-21 MED ORDER — FENTANYL CITRATE 0.05 MG/ML IJ SOLN
INTRAMUSCULAR | Status: DC | PRN
  Administered 2014-03-21: 50 ug via INTRAVENOUS

## 2014-03-21 MED ORDER — FENTANYL CITRATE 0.05 MG/ML IJ SOLN
50.0000 ug | INTRAMUSCULAR | Status: DC | PRN
  Administered 2014-03-21: 100 ug via INTRAVENOUS

## 2014-03-21 MED ORDER — MIDAZOLAM HCL 2 MG/2ML IJ SOLN
1.0000 mg | INTRAMUSCULAR | Status: DC | PRN
Start: 2014-03-21 — End: 2014-03-21
  Administered 2014-03-21: 2 mg via INTRAVENOUS

## 2014-03-21 MED ORDER — SENNA-DOCUSATE SODIUM 8.6-50 MG PO TABS
2.0000 | ORAL_TABLET | Freq: Every day | ORAL | Status: DC
Start: 2014-03-21 — End: 2015-11-25

## 2014-03-21 MED ORDER — ONDANSETRON HCL 4 MG PO TABS: 4.0000 mg | ORAL_TABLET | Freq: Three times a day (TID) | ORAL | Status: DC | PRN

## 2014-03-21 MED ORDER — MIDAZOLAM HCL 2 MG/2ML IJ SOLN
INTRAMUSCULAR | Status: AC
  Filled 2014-03-21: qty 2

## 2014-03-21 MED ORDER — PROMETHAZINE HCL 25 MG/ML IJ SOLN: 6.2500 mg | INTRAMUSCULAR | Status: DC | PRN

## 2014-03-21 MED ORDER — FENTANYL CITRATE 0.05 MG/ML IJ SOLN
INTRAMUSCULAR | Status: AC
  Filled 2014-03-21: qty 2

## 2014-03-21 MED ORDER — SODIUM CHLORIDE 0.9 % IR SOLN
Status: DC | PRN
  Administered 2014-03-21: 15000 mL

## 2014-03-21 MED ORDER — FENTANYL CITRATE 0.05 MG/ML IJ SOLN: 25.0000 ug | INTRAMUSCULAR | Status: DC | PRN

## 2014-03-21 MED ORDER — SUCCINYLCHOLINE CHLORIDE 20 MG/ML IJ SOLN
INTRAMUSCULAR | Status: DC | PRN
  Administered 2014-03-21: 100 mg via INTRAVENOUS

## 2014-03-21 MED ORDER — CEFAZOLIN SODIUM-DEXTROSE 2-3 GM-% IV SOLR
2.0000 g | INTRAVENOUS | Status: AC
  Administered 2014-03-21: 2 g via INTRAVENOUS

## 2014-03-21 SURGICAL SUPPLY — 64 items
ANCHOR SUT BIO SW 4.75X19.1 (Anchor) ×6 IMPLANT
BLADE CUTTER GATOR 3.5 (BLADE) ×2 IMPLANT
BLADE GREAT WHITE 4.2 (BLADE) IMPLANT
BLADE SURG 15 STRL LF DISP TIS (BLADE) IMPLANT
BLADE SURG 15 STRL SS (BLADE)
BUR OVAL 6.0 (BURR) ×2 IMPLANT
CANNULA 5.75X71 LONG (CANNULA) ×2 IMPLANT
CANNULA TWIST IN 8.25X7CM (CANNULA) ×4 IMPLANT
CLSR STERI-STRIP ANTIMIC 1/2X4 (GAUZE/BANDAGES/DRESSINGS) ×2 IMPLANT
DECANTER SPIKE VIAL GLASS SM (MISCELLANEOUS) IMPLANT
DRAPE INCISE IOBAN 66X45 STRL (DRAPES) ×2 IMPLANT
DRAPE SHOULDER BEACH CHAIR (DRAPES) ×2 IMPLANT
DRAPE U 20/CS (DRAPES) ×2 IMPLANT
DRAPE U-SHAPE 47X51 STRL (DRAPES) ×2 IMPLANT
DRSG PAD ABDOMINAL 8X10 ST (GAUZE/BANDAGES/DRESSINGS) ×2 IMPLANT
DURAPREP 26ML APPLICATOR (WOUND CARE) ×2 IMPLANT
ELECT REM PT RETURN 9FT ADLT (ELECTROSURGICAL)
ELECTRODE REM PT RTRN 9FT ADLT (ELECTROSURGICAL) IMPLANT
FIBERSTICK 2 (SUTURE) IMPLANT
GAUZE SPONGE 4X4 12PLY STRL (GAUZE/BANDAGES/DRESSINGS) ×2 IMPLANT
GLOVE BIO SURGEON STRL SZ7 (GLOVE) ×2 IMPLANT
GLOVE BIO SURGEON STRL SZ8 (GLOVE) ×2 IMPLANT
GLOVE BIOGEL PI IND STRL 7.0 (GLOVE) ×1 IMPLANT
GLOVE BIOGEL PI IND STRL 7.5 (GLOVE) ×1 IMPLANT
GLOVE BIOGEL PI IND STRL 8 (GLOVE) ×2 IMPLANT
GLOVE BIOGEL PI INDICATOR 7.0 (GLOVE) ×1
GLOVE BIOGEL PI INDICATOR 7.5 (GLOVE) ×1
GLOVE BIOGEL PI INDICATOR 8 (GLOVE) ×2
GLOVE ECLIPSE 6.5 STRL STRAW (GLOVE) ×2 IMPLANT
GLOVE ORTHO TXT STRL SZ7.5 (GLOVE) ×2 IMPLANT
GOWN STRL REUS W/ TWL LRG LVL3 (GOWN DISPOSABLE) ×2 IMPLANT
GOWN STRL REUS W/ TWL XL LVL3 (GOWN DISPOSABLE) ×2 IMPLANT
GOWN STRL REUS W/TWL LRG LVL3 (GOWN DISPOSABLE) ×2
GOWN STRL REUS W/TWL XL LVL3 (GOWN DISPOSABLE) ×2
IMMOBILIZER SHOULDER FOAM XLGE (SOFTGOODS) IMPLANT
KIT SHOULDER TRACTION (DRAPES) ×2 IMPLANT
LASSO 90 CVE QUICKPAS (DISPOSABLE) ×2 IMPLANT
MANIFOLD NEPTUNE II (INSTRUMENTS) ×2 IMPLANT
NEEDLE SCORPION MULTI FIRE (NEEDLE) ×2 IMPLANT
PACK ARTHROSCOPY DSU (CUSTOM PROCEDURE TRAY) ×2 IMPLANT
PACK BASIN DAY SURGERY FS (CUSTOM PROCEDURE TRAY) ×2 IMPLANT
SET ARTHROSCOPY TUBING (MISCELLANEOUS) ×1
SET ARTHROSCOPY TUBING LN (MISCELLANEOUS) ×1 IMPLANT
SHEET MEDIUM DRAPE 40X70 STRL (DRAPES) ×2 IMPLANT
SLEEVE SCD COMPRESS KNEE MED (MISCELLANEOUS) ×2 IMPLANT
SLING ARM IMMOBILIZER LRG (SOFTGOODS) IMPLANT
SLING ARM IMMOBILIZER MED (SOFTGOODS) IMPLANT
SLING ARM LRG ADULT FOAM STRAP (SOFTGOODS) IMPLANT
SLING ARM MED ADULT FOAM STRAP (SOFTGOODS) IMPLANT
SLING ARM XL FOAM STRAP (SOFTGOODS) IMPLANT
SUT FIBERWIRE #2 38 T-5 BLUE (SUTURE) ×2
SUT MNCRL AB 4-0 PS2 18 (SUTURE) ×2 IMPLANT
SUT PDS AB 1 CT  36 (SUTURE)
SUT PDS AB 1 CT 36 (SUTURE) IMPLANT
SUT TIGER TAPE 7 IN WHITE (SUTURE) ×4 IMPLANT
SUT VIC AB 3-0 SH 27 (SUTURE)
SUT VIC AB 3-0 SH 27X BRD (SUTURE) IMPLANT
SUTURE FIBERWR #2 38 T-5 BLUE (SUTURE) ×1 IMPLANT
TAPE FIBER 2MM 7IN #2 BLUE (SUTURE) ×4 IMPLANT
TOWEL OR 17X24 6PK STRL BLUE (TOWEL DISPOSABLE) ×2 IMPLANT
TOWEL OR NON WOVEN STRL DISP B (DISPOSABLE) ×2 IMPLANT
TUBE CONNECTING 20X1/4 (TUBING) IMPLANT
WAND STAR VAC 90 (SURGICAL WAND) ×2 IMPLANT
WATER STERILE IRR 1000ML POUR (IV SOLUTION) ×2 IMPLANT

## 2014-03-21 NOTE — Progress Notes (Signed)
Assisted Dr. Rose with left, ultrasound guided, interscalene  block. Side rails up, monitors on throughout procedure. See vital signs in flow sheet. Tolerated Procedure well.  

## 2014-03-21 NOTE — Anesthesia Procedure Notes (Addendum)
Anesthesia Regional Block:  Interscalene brachial plexus block  Pre-Anesthetic Checklist: ,, timeout performed, Correct Patient, Correct Site, Correct Laterality, Correct Procedure, Correct Position, site marked, Risks and benefits discussed,  Surgical consent,  Pre-op evaluation,  At surgeon's request and post-op pain management   Prep: chloraprep       Needles:  Injection technique: Single-shot  Needle Type: Echogenic Stimulator Needle     Needle Length: 9cm 9 cm Needle Gauge: 21 and 21 G    Additional Needles:  Procedures: ultrasound guided (picture in chart) Interscalene brachial plexus block Narrative:  Start time: 03/21/2014 9:30 AM End time: 03/21/2014 9:40 AM Injection made incrementally with aspirations every 5 mL.  Performed by: Personally  Anesthesiologist: ROSE, Iona Beard  Additional Notes: Patient tolerated the procedure well without complications   Procedure Name: Intubation Date/Time: 03/21/2014 10:00 AM Performed by: Basheer Molchan Pre-anesthesia Checklist: Patient identified, Emergency Drugs available, Suction available and Patient being monitored Patient Re-evaluated:Patient Re-evaluated prior to inductionOxygen Delivery Method: Circle System Utilized Preoxygenation: Pre-oxygenation with 100% oxygen Intubation Type: IV induction Ventilation: Mask ventilation without difficulty Laryngoscope Size: Mac and 3 Grade View: Grade I Tube type: Oral Tube size: 7.0 mm Number of attempts: 1 Airway Equipment and Method: Stylet and Oral airway Placement Confirmation: ETT inserted through vocal cords under direct vision,  positive ETCO2 and breath sounds checked- equal and bilateral Secured at: 21 cm Tube secured with: Tape Dental Injury: Teeth and Oropharynx as per pre-operative assessment

## 2014-03-21 NOTE — Transfer of Care (Signed)
Immediate Anesthesia Transfer of Care Note  Patient: Vernon Carpenter  Procedure(s) Performed: Procedure(s): LEFT SHOULDER ARTHROSCOPY,  DEBRIDEMENT ACROMIOPLASTY, ROTATOR CUFF REPAIR  (Left) SHOULDER ACROMIOPLASTY (Left)  Patient Location: PACU  Anesthesia Type:GA combined with regional for post-op pain  Level of Consciousness: awake and alert   Airway & Oxygen Therapy: Patient Spontanous Breathing and Patient connected to face mask oxygen  Post-op Assessment: Report given to RN and Post -op Vital signs reviewed and stable  Post vital signs: Reviewed and stable  Last Vitals:  Filed Vitals:   03/21/14 0951  BP:   Pulse: 93  Temp:   Resp: 15    Complications: No apparent anesthesia complications

## 2014-03-21 NOTE — Anesthesia Preprocedure Evaluation (Signed)
Anesthesia Evaluation  Patient identified by MRN, date of birth, ID band Patient awake    Reviewed: Allergy & Precautions, NPO status , Patient's Chart, lab work & pertinent test results  Airway Mallampati: II  TM Distance: >3 FB Neck ROM: Full    Dental no notable dental hx.    Pulmonary asthma , COPD COPD inhaler, Current Smoker,  breath sounds clear to auscultation  Pulmonary exam normal       Cardiovascular hypertension, Pt. on medications Rhythm:Regular Rate:Normal     Neuro/Psych negative neurological ROS  negative psych ROS   GI/Hepatic Neg liver ROS, GERD-  Medicated,  Endo/Other  negative endocrine ROS  Renal/GU negative Renal ROS  negative genitourinary   Musculoskeletal negative musculoskeletal ROS (+)   Abdominal   Peds negative pediatric ROS (+)  Hematology negative hematology ROS (+)   Anesthesia Other Findings   Reproductive/Obstetrics negative OB ROS                             Anesthesia Physical Anesthesia Plan  ASA: III  Anesthesia Plan: General   Post-op Pain Management:    Induction: Intravenous  Airway Management Planned: Oral ETT  Additional Equipment:   Intra-op Plan:   Post-operative Plan: Extubation in OR  Informed Consent: I have reviewed the patients History and Physical, chart, labs and discussed the procedure including the risks, benefits and alternatives for the proposed anesthesia with the patient or authorized representative who has indicated his/her understanding and acceptance.   Dental advisory given  Plan Discussed with: CRNA and Surgeon  Anesthesia Plan Comments:         Anesthesia Quick Evaluation

## 2014-03-21 NOTE — Op Note (Signed)
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  Second assistant: Reita Cliche, PA student  ANESTHESIA:   General  PREOPERATIVE INDICATIONS:  Vernon Carpenter is a  54 y.o. male with a diagnosis of other atricular cartilage disorders, left shoulder impingment syndrome of left shoulder strain of muscle and tendon of the rotator cuff on left shoulder initial encounter  who failed conservative measures and elected for surgical management.    The risks benefits and alternatives were discussed with the patient preoperatively including but not limited to the risks of infection, bleeding, nerve injury, cardiopulmonary complications, the need for revision surgery, among others, and the patient was willing to proceed.  OPERATIVE IMPLANTS: Arthrex bio composite 4.75 mm swivel lock 3, one for the subscapularis, one for the infraspinatus and one for the supraspinatus.  OPERATIVE FINDINGS: The shoulder had full motion during examination under anesthesia. The biceps tendon and pulley were intact. Subscapularis had substantial fraying, and delamination superiorly, with os of tension of the middle glenohumeral ligament. This was a partial upper border tear.  The supraspinatus and infraspinatus were completely torn off of the tuberosity, and a reverse L-type shape configuration. The tissue was fairly mobile, but it was a bilaminar tear. There was significant retraction. Tissue quality was mediocre. There was moderate CA  ligament fraying as well as undersurface spurring, which was debrided. Bone quality was fair.  OPERATIVE PROCEDURE: The patient is brought to the operating room and placed in supine position. Gen. anesthesia was administered. IV antibiotics were given. Examination under anesthesia demonstrated the above-named findings.  He was placed in the semilateral decubitus position and 10 pounds of traction and the left upper extremity was prepped and draped in usual sterile fashion. Time out performed. Diagnostic arthroscopy carried out the above-named findings and I used the arthroscopic shaver to debride a small area of the glenoid face which had a little chondromalacia, and I also debrided the subscapularis as well as portions of frayed tissue in the interval and in the supraspinatus. I elected to repair the subscapularis, sore placed 2 anterior cannulas, prepared the lesser tuberosity with a bur, and then passed a simple FiberWire suture using a certain curved suture lasso. I then brought this into a swivel lock into the head and had excellent fixation and restoration of soft tissue tension over the subscapularis.  I then went to the subacromial space. Complete bursectomy and extensive debridement carried out, followed by a CA ligament release and a light acromioplasty. The before meals joint was not violated, and appeared to be in good condition. He is not hurting there, preoperatively, so I did not do any distal clavicle work.  I then completed the debridement with a complete bursectomy, assessed the tear pattern configuration, and then used a bird beak suture passer to place a posterior fiber tape. I then attempted to place a fiber tape more anteriorly, but the bilaminar configuration of the tear was so thick that the scorpion suture passer would not pass the stitch. Therefore I used a curved suture passer that I used on the subscapularis for this portion of the case.  A horizontal mattress inverted  configuration was utilized, through the anterior portion of the tear. I then anchored the posterior cuff with a swivel lock and then the anterior cuff closing the L with the anterior swivel lock. Excellent fixation was achieved, and the shoulder moved as a single unit.  The fiber tape sutures were cut, the portals closed with Monocryl followed by Steri-Strips and sterile gauze. He was awakened and returned to the PACU in stable and satisfactory condition. There were no complications and he tolerated the procedure well.

## 2014-03-21 NOTE — Discharge Instructions (Signed)
Diet: As you were doing prior to hospitalization  ° °Shower:  May shower but keep the wounds dry, use an occlusive plastic wrap, NO SOAKING IN TUB.  If the bandage gets wet, change with a clean dry gauze. ° °Dressing:  You may change your dressing 3-5 days after surgery.  Then change the dressing daily with sterile gauze dressing.   ° °There are sticky tapes (steri-strips) on your wounds and all the stitches are absorbable.  Leave the steri-strips in place when changing your dressings, they will peel off with time, usually 2-3 weeks. ° °Activity:  Increase activity slowly as tolerated, but follow the weight bearing instructions below.  No lifting or driving for 6 weeks. ° °Weight Bearing:   Sling at all times.   ° °To prevent constipation: you may use a stool softener such as - ° °Colace (over the counter) 100 mg by mouth twice a day  °Drink plenty of fluids (prune juice may be helpful) and high fiber foods °Miralax (over the counter) for constipation as needed.   ° °Itching:  If you experience itching with your medications, try taking only a single pain pill, or even half a pain pill at a time.  You may take up to 10 pain pills per day, and you can also use benadryl over the counter for itching or also to help with sleep.  ° °Precautions:  If you experience chest pain or shortness of breath - call 911 immediately for transfer to the hospital emergency department!! ° °If you develop a fever greater that 101 F, purulent drainage from wound, increased redness or drainage from wound, or calf pain -- Call the office at [phone]                                                °Follow- Up Appointment:  Please call for an appointment to be seen in 2 weeks [facility] - [phone] ° ° ° ° °Regional Anesthesia Blocks ° °1. Numbness or the inability to move the "blocked" extremity may last from 3-48 hours after placement. The length of time depends on the medication injected and your individual response to the  medication. If the numbness is not going away after 48 hours, call your surgeon. ° °2. The extremity that is blocked will need to be protected until the numbness is gone and the  Strength has returned. Because you cannot feel it, you will need to take extra care to avoid injury. Because it may be weak, you may have difficulty moving it or using it. You may not know what position it is in without looking at it while the block is in effect. ° °3. For blocks in the legs and feet, returning to weight bearing and walking needs to be done carefully. You will need to wait until the numbness is entirely gone and the strength has returned. You should be able to move your leg and foot normally before you try and bear weight or walk. You will need someone to be with you when you first try to ensure you do not fall and possibly risk injury. ° °4. Bruising and tenderness at the needle site are common side effects and will resolve in a few days. ° °5. Persistent numbness or new problems with movement should be communicated to the surgeon or the [facility] ([phone])/ [facility] ([phone]). ° ° ° °Post Anesthesia Home Care Instructions ° °Activity: °Get plenty of rest for the remainder of the day. A responsible adult should stay with you for 24 hours following the procedure.  °For the next 24 hours, DO NOT: °-Drive a car °-Operate machinery °-Drink alcoholic beverages °-Take any medication unless instructed by your physician °-Make any legal decisions or sign important papers. ° °Meals: °Start with liquid foods such as gelatin or soup. Progress to regular foods as tolerated. Avoid greasy, spicy, heavy foods. If nausea and/or vomiting occur, drink only clear liquids until the nausea and/or vomiting subsides. Call your physician if vomiting continues. ° °Special Instructions/Symptoms: °Your throat may feel dry or sore from the anesthesia or the breathing tube placed in your throat during surgery.  If this causes discomfort, gargle with warm salt water. The discomfort should disappear within 24 hours. ° °

## 2014-03-21 NOTE — Anesthesia Postprocedure Evaluation (Signed)
  Anesthesia Post-op Note  Patient: Vernon Carpenter  Procedure(s) Performed: Procedure(s): LEFT SHOULDER ARTHROSCOPY,  DEBRIDEMENT ACROMIOPLASTY, ROTATOR CUFF REPAIR  (Left) SHOULDER ACROMIOPLASTY (Left)  Patient Location: PACU  Anesthesia Type:General and GA combined with regional for post-op pain  Level of Consciousness: awake and alert   Airway and Oxygen Therapy: Patient Spontanous Breathing  Post-op Pain: mild  Post-op Assessment: Post-op Vital signs reviewed  Post-op Vital Signs: stable  Last Vitals:  Filed Vitals:   03/21/14 1300  BP: 152/92  Pulse: 91  Temp:   Resp: 15    Complications: No apparent anesthesia complications
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
## 2014-03-25 ENCOUNTER — Encounter (HOSPITAL_BASED_OUTPATIENT_CLINIC_OR_DEPARTMENT_OTHER): Payer: Self-pay | Admitting: Orthopedic Surgery

## 2014-03-31 ENCOUNTER — Other Ambulatory Visit: Payer: Self-pay | Admitting: *Deleted

## 2014-03-31 NOTE — Telephone Encounter (Signed)
Pt called for refill of vicodin.  Last filled 1/29 for # 120 Pt also had surgery and recieved Percocet # 75 on 2/19.  He has another Rx from Dr Mardelle Matte for percocet  Pending.  Will hold request for vicodin until I see if percocet is ordered from [facility]

## 2014-04-01 NOTE — Telephone Encounter (Signed)
Richardson Landry from op pharm at [facility] calls and states pt brought in a script for #75 percocet 10/325 today from dr Mardelle Matte, he also got #75 percocet on 2/19 from dr Mardelle Matte

## 2014-04-01 NOTE — Telephone Encounter (Signed)
It is OK if he temp gets pain meds from his surgeon. That would be best since I will not give him RF of Vicodin today since too early (got on 1/29 but then also got #75 percocet on 2/19). We will need written notice from ortho that they are no longer Rx opioids and then we will resume his nl Vicodin.

## 2014-04-01 NOTE — Telephone Encounter (Signed)
Talked with Dr Lynnae January and Vicodin will be held while pt is post-op and getting percocet.  Pharmacy is informed and will dispense percocet # 75 per Dr Denton [facility]. Pt will get a note from Dr Mardelle Matte at end of treatment with percocet.  He understands.

## 2014-04-14 ENCOUNTER — Encounter: Payer: Self-pay | Admitting: Internal Medicine

## 2014-04-14 ENCOUNTER — Ambulatory Visit (INDEPENDENT_AMBULATORY_CARE_PROVIDER_SITE_OTHER): Payer: Medicaid Other | Admitting: Internal Medicine

## 2014-04-14 VITALS — BP 139/90 | HR 83 | Temp 98.0°F | Wt 179.7 lb

## 2014-04-14 DIAGNOSIS — G4700 Insomnia, unspecified: Secondary | ICD-10-CM

## 2014-04-14 DIAGNOSIS — Z114 Encounter for screening for human immunodeficiency virus [HIV]: Secondary | ICD-10-CM

## 2014-04-14 DIAGNOSIS — I10 Essential (primary) hypertension: Secondary | ICD-10-CM

## 2014-04-14 DIAGNOSIS — M549 Dorsalgia, unspecified: Secondary | ICD-10-CM

## 2014-04-14 DIAGNOSIS — M25512 Pain in left shoulder: Secondary | ICD-10-CM

## 2014-04-14 DIAGNOSIS — G8929 Other chronic pain: Secondary | ICD-10-CM

## 2014-04-14 MED ORDER — HYDROCODONE-ACETAMINOPHEN 10-325 MG PO TABS: 1.0000 | ORAL_TABLET | Freq: Four times a day (QID) | ORAL | Status: DC | PRN

## 2014-04-14 MED ORDER — TRAZODONE HCL 50 MG PO TABS: 50.0000 mg | ORAL_TABLET | Freq: Every day | ORAL | Status: DC

## 2014-04-14 MED ORDER — GABAPENTIN 300 MG PO CAPS: 300.0000 mg | ORAL_CAPSULE | Freq: Three times a day (TID) | ORAL | Status: AC

## 2014-04-14 MED ORDER — HYDROCODONE-ACETAMINOPHEN 10-325 MG PO TABS
1.0000 | ORAL_TABLET | Freq: Four times a day (QID) | ORAL | Status: DC | PRN
Start: 2014-04-14 — End: 2014-04-14

## 2014-04-14 NOTE — Progress Notes (Signed)
   Subjective:    Patient ID: Vernon Carpenter, male    DOB: 06/28/1960, 54 y.o.   MRN: [id number]  HPI  54 yo male with HTN, chronic asthma, chronic disabling back pain, and also recent shoulder pain s/p surgery for rotator cuff tear on 03/21/2014 with Dr. Mardelle Matte here for follow up.  Shoulder pain is still persistent 8 out of 10, on left shoulder. Still painful to move around the shoulder. Was taking percocet for pain that was given by Dr. Mardelle Matte but is almost done. Wants to go back to norco that he usually takes.   Is on norco 10-325mg  #120 tab monthly for back pain. Pain is unchanged, belt like pattern, without radiation on to the leg. He states his activities are affected due to the pain, cannot sit straight due to pain or move around too much.   Continues to smoke. On dulera + albuterol prn for asthma.    Review of Systems  Constitutional: Negative for fever, chills and fatigue.  HENT: Negative.   Respiratory: Negative for chest tightness and shortness of breath.   Cardiovascular: Negative for chest pain and leg swelling.  Gastrointestinal: Negative.   Endocrine: Negative.   Genitourinary: Negative for dysuria and difficulty urinating.  Musculoskeletal: Positive for back pain and arthralgias. Negative for myalgias and neck pain.  Skin: Negative.   Allergic/Immunologic: Negative.   Neurological: Negative for dizziness, weakness and numbness.  Psychiatric/Behavioral: Negative.        Objective:   Physical Exam  Constitutional: He is oriented to person, place, and time. He appears well-developed and well-nourished. No distress.  HENT:  Head: Normocephalic and atraumatic.  Nose: Nose normal.  Mouth/Throat: Oropharynx is clear and moist.  Eyes: EOM are normal. Pupils are equal, round, and reactive to light.  Neck: Normal range of motion.  Cardiovascular: Normal rate and regular rhythm.  Exam reveals no gallop and no friction rub.   No murmur heard. Pulmonary/Chest: Effort  normal and breath sounds normal. No respiratory distress. He has no wheezes. He has no rales. He exhibits no tenderness.  Abdominal: Soft. Bowel sounds are normal. He exhibits no mass. There is no rebound.  Musculoskeletal:  Left shoulder TTP, rom limited due to pain. 5/5 strength on both hands.  Lower ext: negative straight leg test. 5/5 strength.  Back: no TTP on spinous processes or musculature.   Neurological: He is alert and oriented to person, place, and time. No cranial nerve deficit. Coordination normal.  Skin: He is not diaphoretic.        Assessment & Plan:  See problem based a&p.

## 2014-04-14 NOTE — Patient Instructions (Signed)
Please follow up with sports medicine for your back pain.  Continue norco (refilled for 2 months #120 tablets monthly).  Take trazodone for insomnia as needed.

## 2014-04-15 LAB — HIV ANTIBODY (ROUTINE TESTING W REFLEX): HIV: NONREACTIVE

## 2014-04-15 NOTE — Assessment & Plan Note (Signed)
Has trouble staying asleep at night. Was taking diazepam given by Dr. Mardelle Matte for this. I asked him to rather take trazodone as needed to see if it helps.

## 2014-04-15 NOTE — Assessment & Plan Note (Signed)
Well controlled 139/90 today. On losartan 25 daily. Continue this.

## 2014-04-15 NOTE — Addendum Note (Signed)
Addended by: Dellia Nims on: 04/15/2014 01:52 PM   Modules accepted: Level of Service

## 2014-04-15 NOTE — Assessment & Plan Note (Signed)
MRI showed rotator cuff tear, had surgery on 03/21/2014 by Dr. Mardelle Matte. Continues to have pain 8/10. No signs of infection on my exam. ROM limited due to pain. Was taking percocet for pain that was given by Dr. Mardelle Matte but is almost done. Wants to go back to norco that he usually takes.  Has f/up appt in few weeks with Dr. Mardelle Matte.

## 2014-04-15 NOTE — Assessment & Plan Note (Signed)
Chronic back pain in belt like pattern. Neuro exam intact, neg straight leg test. No weakness, numbness, tingling on radiation of pain to the legs. Bowel and bladder function normal.  MRI L spine 06/2011 showed vertebral spurring and foraminal narrowing.   Will refer back to sports medicine again this time for back pain.  Refilled norco 10-325mg  #120 tabs Refilled gabapentin.

## 2014-04-16 NOTE — Addendum Note (Signed)
Addended by: Dellia Nims on: 04/16/2014 12:58 PM   Modules accepted: Level of Service

## 2014-04-17 NOTE — Progress Notes (Signed)
Internal Medicine Clinic Attending Date of Visit: 04/14/2014  Case discussed with Dr. Genene Churn at the time of the visit.  We reviewed the resident's history and exam and pertinent patient test results.  I agree with the assessment and plan of care documented in the resident's note.

## 2014-04-30 NOTE — Addendum Note (Signed)
Addended by: Hulan Fray on: 04/30/2014 06:40 PM   Modules accepted: Orders

## 2014-05-05 ENCOUNTER — Encounter: Payer: Self-pay | Admitting: Physical Therapy

## 2014-05-05 ENCOUNTER — Ambulatory Visit: Payer: Medicaid Other | Attending: Orthopedic Surgery | Admitting: Physical Therapy

## 2014-05-05 DIAGNOSIS — M25511 Pain in right shoulder: Secondary | ICD-10-CM | POA: Diagnosis not present

## 2014-05-05 DIAGNOSIS — R6889 Other general symptoms and signs: Secondary | ICD-10-CM | POA: Diagnosis not present

## 2014-05-05 DIAGNOSIS — R293 Abnormal posture: Secondary | ICD-10-CM
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
## 2014-05-05 NOTE — Patient Instructions (Signed)
Cryotherapy Cryotherapy means treatment with cold. Ice or gel packs can be used to reduce both pain and swelling. Ice is the most helpful within the first 24 to 48 hours after an injury or flare-up from overusing a muscle or joint. Sprains, strains, spasms, burning pain, shooting pain, and aches can all be eased with ice. Ice can also be used when recovering from surgery. Ice is effective, has very few side effects, and is safe for most people to use. PRECAUTIONS  Ice is not a safe treatment option for people with:  Raynaud phenomenon. This is a condition affecting small blood vessels in the extremities. Exposure to cold may cause your problems to return.  Cold hypersensitivity. There are many forms of cold hypersensitivity, including:  Cold urticaria. Red, itchy hives appear on the skin when the tissues begin to warm after being iced.  Cold erythema. This is a red, itchy rash caused by exposure to cold.  Cold hemoglobinuria. Red blood cells break down when the tissues begin to warm after being iced. The hemoglobin that carry oxygen are passed into the urine because they cannot combine with blood proteins fast enough.  Numbness or altered sensitivity in the area being iced. If you have any of the following conditions, do not use ice until you have discussed cryotherapy with your caregiver:  Heart conditions, such as arrhythmia, angina, or chronic heart disease.  High blood pressure.  Healing wounds or open skin in the area being iced.  Current infections.  Rheumatoid arthritis.  Poor circulation.  Diabetes. Ice slows the blood flow in the region it is applied. This is beneficial when trying to stop inflamed tissues from spreading irritating chemicals to surrounding tissues. However, if you expose your skin to cold temperatures for too long or without the proper protection, you can damage your skin or nerves. Watch for signs of skin damage due to cold. HOME CARE INSTRUCTIONS Follow  these tips to use ice and cold packs safely.  Place a dry or damp towel between the ice and skin. A damp towel will cool the skin more quickly, so you may need to shorten the time that the ice is used.  For a more rapid response, add gentle compression to the ice.  Ice for no more than 10 to 20 minutes at a time. The bonier the area you are icing, the less time it will take to get the benefits of ice.  Check your skin after 5 minutes to make sure there are no signs of a poor response to cold or skin damage.  Rest 20 minutes or more between uses.  Once your skin is numb, you can end your treatment. You can test numbness by very lightly touching your skin. The touch should be so light that you do not see the skin dimple from the pressure of your fingertip. When using ice, most people will feel these normal sensations in this order: cold, burning, aching, and numbness.  Do not use ice on someone who cannot communicate their responses to pain, such as small children or people with dementia. HOW TO MAKE AN ICE PACK Ice packs are the most common way to use ice therapy. Other methods include ice massage, ice baths, and cryosprays. Muscle creams that cause a cold, tingly feeling do not offer the same benefits that ice offers and should not be used as a substitute unless recommended by your caregiver. To make an ice pack, do one of the following:  Place crushed ice or a  bag of frozen vegetables in a sealable plastic bag. Squeeze out the excess air. Place this bag inside another plastic bag. Slide the bag into a pillowcase or place a damp towel between your skin and the bag.  Mix 3 parts water with 1 part rubbing alcohol. Freeze the mixture in a sealable plastic bag. When you remove the mixture from the freezer, it will be slushy. Squeeze out the excess air. Place this bag inside another plastic bag. Slide the bag into a pillowcase or place a damp towel between your skin and the bag. SEEK MEDICAL CARE  IF:  You develop white spots on your skin. This may give the skin a blotchy (mottled) appearance.  Your skin turns blue or pale.  Your skin becomes waxy or hard.  Your swelling gets worse. MAKE SURE YOU:   Understand these instructions.  Will watch your condition.  Will get help right away if you are not doing well or get worse. Document Released: 09/13/2010 Document Revised: 06/03/2013 Document Reviewed: 09/13/2010 [facility] Patient Information 2015 [place], [place]. This information is not intended to replace advice given to you by your health care provider. Make sure you discuss any questions you have with your health care provider.  You were given handout for Pendulum exercises  3 x a day for 2 sets of 10  Also for shoulder shrugs, elbow flex/ext and grip squeeze with ball with elbow supported by table.  Voncille Lo, PT 05/05/2014 12:20 PM Phone: [phone] Fax: [phone]

## 2014-05-09 ENCOUNTER — Ambulatory Visit (INDEPENDENT_AMBULATORY_CARE_PROVIDER_SITE_OTHER): Payer: Medicaid Other | Admitting: Internal Medicine

## 2014-05-09 ENCOUNTER — Encounter: Payer: Self-pay | Admitting: Internal Medicine

## 2014-05-09 VITALS — BP 124/84 | HR 80 | Ht 64.0 in | Wt 178.2 lb

## 2014-05-09 DIAGNOSIS — Z720 Tobacco use: Secondary | ICD-10-CM | POA: Diagnosis not present

## 2014-05-09 DIAGNOSIS — J4541 Moderate persistent asthma with (acute) exacerbation: Secondary | ICD-10-CM | POA: Diagnosis not present

## 2014-05-09 DIAGNOSIS — F17200 Nicotine dependence, unspecified, uncomplicated: Secondary | ICD-10-CM

## 2014-05-09 MED ORDER — PREDNISONE 10 MG PO TABS: ORAL_TABLET | ORAL | Status: DC

## 2014-05-09 MED ORDER — ALBUTEROL SULFATE (2.5 MG/3ML) 0.083% IN NEBU: 2.5000 mg | INHALATION_SOLUTION | Freq: Four times a day (QID) | RESPIRATORY_TRACT | Status: AC | PRN

## 2014-05-09 MED ORDER — MOMETASONE FURO-FORMOTEROL FUM 200-5 MCG/ACT IN AERO: 2.0000 | INHALATION_SPRAY | Freq: Two times a day (BID) | RESPIRATORY_TRACT | Status: AC
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
## 2014-05-09 NOTE — Patient Instructions (Addendum)
ICD-9-CM ICD-10-CM   1. Asthma, moderate persistent, with acute exacerbation 493.92 J45.41   2. Smoking 305.1 Z72.0    Current flare up is due to social stressors, smoking and stopping dulera You need to quit smoking Please researt dulera 200/5 , 2 puff twice daily - never forget to use this daily USe albuterol as needed Take prednisone 40 mg daily x 2 days, then 20mg  daily x 2 days, then 10mg  daily x 2 days, then 5mg  daily x 2 days and stop  Followup  6 months or sooner if needed

## 2014-05-19 ENCOUNTER — Ambulatory Visit: Payer: Medicaid Other | Admitting: Physical Therapy

## 2014-05-19 DIAGNOSIS — R6889 Other general symptoms and signs: Secondary | ICD-10-CM

## 2014-05-19 DIAGNOSIS — R293 Abnormal posture: Secondary | ICD-10-CM

## 2014-05-19 DIAGNOSIS — M25511 Pain in right shoulder: Secondary | ICD-10-CM

## 2014-05-19 NOTE — Patient Instructions (Addendum)
  SHOULDER: External Rotation - Supine (Cane)   Hold cane with both hands. Rotate arm away from body. Keep elbow on floor and next to body. _10__ reps per set, 3___ sets per day, ___ days per week Add towel to keep elbow at side.  Copyright  VHI. All rights reserved.  Cane Horizontal - Supine   With straight arms holding cane above shoulders, bring cane out to right, center, out to left, and back to above head. Repeat _3 x 10__ times. Do 1-2___ times per day. Hold for 5 sec Copyright  VHI. All rights reserved.  Cane Exercise: Flexion   Lie on back, holding cane above chest. Keeping arms as straight as possible, lower cane toward floor beyond head. Hold __5__ seconds. Repeat _3 x 10___ times. Do ___1-2_ sessions per day.  http://gt2.exer.us/91   Copyright  VHI. All rights reserved.  SHOULDER: External Rotation - Supine (Cane)   Hold cane with both hands. Rotate arm away from body. Keep elbow on floor and next to body. ___ reps per set, ___ sets per day, ___ days per week Add towel to keep elbow at side.  Copyright  VHI. All rights reserved.  Cane Horizontal - Supine   With straight arms holding cane above shoulders, bring cane out to right, center, out to left, and back to above head. Repeat ___ times. Do ___ times per day.  Copyright  VHI. All rights reserved.  Cane Exercise: Flexion   Lie on back, holding cane above chest. Keeping arms as straight as possible, lower cane toward floor beyond head. Hold ____ seconds. Repeat ____ times. Do ____ sessions per day.  http://gt2.exer.us/91   Copyright  VHI. All rights reserved.   Flexibility: Neck Retraction   LIe down with ear over shoulder with one pillow. Chin tuck and hold for 3 seconds each Repeat ___10_ times per set. Do __2__ sets per session. Do __1-2__ sessions per day.  http://orth.exer.us/344   Levator Stretch   Grasp seat or sit on hand on side to be stretched. Turn head toward other side and look  down. Use hand on head to gently stretch neck in that position. Hold ____ seconds. Repeat on other side. Repeat ____ times. Do ____ sessions per day.  http://gt2.exer.us/30   Copyright  VHI. All rights reserved.  Side-Bending   One hand on opposite side of head, pull head to side as far as is comfortable. Stop if there is pain. Hold _30 ___ seconds. Repeat with other hand to other side. Repeat _2-3___ times. Do _2-3___ sessions per day.  Voncille Lo, PT 05/19/2014 1:44 PM Phone: [phone] Fax: [phone]
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
## 2014-06-02 ENCOUNTER — Telehealth: Payer: Self-pay | Admitting: *Deleted

## 2014-06-02 ENCOUNTER — Ambulatory Visit: Payer: Medicaid Other | Attending: Orthopedic Surgery | Admitting: Physical Therapy

## 2014-06-02 DIAGNOSIS — R6889 Other general symptoms and signs: Secondary | ICD-10-CM | POA: Insufficient documentation

## 2014-06-02 DIAGNOSIS — M25511 Pain in right shoulder: Secondary | ICD-10-CM

## 2014-06-02 DIAGNOSIS — R293 Abnormal posture: Secondary | ICD-10-CM | POA: Insufficient documentation

## 2014-06-02 NOTE — Patient Instructions (Addendum)
Strengthening: Resisted Internal Rotation   Hold tubing in left hand, elbow at side and forearm out. Rotate forearm in across body. Repeat _10___ times per set. Do _3___ sets per session. Do __1-2__ sessions per day.  http://orth.exer.us/830   Copyright  VHI. All rights reserved.  Strengthening: Resisted External Rotation   Hold tubing in right hand, elbow at side and forearm across body. Rotate forearm out. Have arm at 90-90 position Repeat __10__ times per set. Do __3__ sets per session. Do _1-2___ sessions per day.  http://orth.exer.us/828   Copyright  VHI. All rights reserved.  Strengthening: Resisted Flexion   Hold tubing with left arm at side. Pull forward and up. Move shoulder through pain-free range of motion. Repeat __10__ times per set. Do 3____ sets per session. Do _1-2___ sessions per day.  http://orth.exer.us/824   Copyright  VHI. All rights reserved.  Strengthening: Resisted Extension   EXTENSION: Standing - Resistance Band: Stable (Active)   Stand, right arm at side. Against yellow resistance band, draw arm backward, as far as possible, keeping elbow straight. Complete _3__ sets of _10__ repetitions with both arms.  Perform _1-2__ sessions per day.  Copyright  VHI. All rights reserved.  Row: Mid-Range - Standing   Copyright  VHI. All rights reserved.  Resistive Band Rowing   With resistive band anchored in door, grasp both ends. Keeping elbows bent, pull back, squeezing shoulder blades together. Hold ____ seconds. Repeat _10 x 3___ times. Do __1-2__ sessions per day.  http://gt2.exer.us/97   Copyright  VHI. All rights reserved.   IONTOPHORESIS PATIENT PRECAUTIONS & CONTRAINDICATIONS:  . Redness under one or both electrodes can occur.  This characterized by a uniform redness that usually disappears within 12 hours of treatment. . Small pinhead size blisters may result in response to the drug.  Contact your physician if the problem persists  more than 24 hours. . On rare occasions, iontophoresis therapy can result in temporary skin reactions such as rash, inflammation, irritation or burns.  The skin reactions may be the result of individual sensitivity to the ionic solution used, the condition of the skin at the start of treatment, reaction to the materials in the electrodes, allergies or sensitivity to dexamethasone, or a poor connection between the patch and your skin.  Discontinue using iontophoresis if you have any of these reactions and report to your therapist. . Remove the Patch or electrodes if you have any undue sensation of pain or burning during the treatment and report discomfort to your therapist. . Tell your Therapist if you have had known adverse reactions to the application of electrical current. . If using the Patch, the LED light will turn off when treatment is complete and the patch can be removed.  Approximate treatment time is 1-3 hours.  Remove the patch when light goes off or after 6 hours. . The Patch can be worn during normal activity, however excessive motion where the electrodes have been placed can cause poor contact between the skin and the electrode or uneven electrical current resulting in greater risk of skin irritation. Marland Kitchen Keep out of the reach of children.   . DO NOT use if you have a cardiac pacemaker or any other electrically sensitive implanted device. . DO NOT use if you have a known sensitivity to dexamethasone. . DO NOT use during Magnetic Resonance Imaging (MRI). . DO NOT use over broken or compromised skin (e.g. sunburn, cuts, or acne) due to the increased risk of skin reaction. . DO NOT SHAVE over  the area to be treated:  To establish good contact between the Patch and the skin, excessive hair may be clipped. . DO NOT place the Patch or electrodes on or over your eyes, directly over your heart, or brain. . DO NOT reuse the Patch or electrodes as this may cause burns to occur.  Towel  exercise as shown in clinic.  Hold for 30 seconds as shown by PT. 2-3 times  2 -3 times a day. Also perform wall clock starting at 7:00 o clock for stretch for 30 to 60 seconds 2-3 times a day. Pt given red Tband to start and to progress to green when red is easy.  Sleeper Stretch on left sidelying, shoulder pulled out and Right hand on wrist.  As shown in clinic.  Remember 90 90 position  Vernon Carpenter, [place] 06/02/2014 11:44 AM Phone: [phone] Fax: [phone]

## 2014-06-02 NOTE — Telephone Encounter (Signed)
Spoke with Butch Penny at outpatient pharmacy, who says Losartan now needs PA. It looks like per Epic PCP prescribes this medication. Butch Penny says patient has hard copy on file from Dr. Chase Caller. PA obtained good thru 45/2/17. [phone]. Called pharmacy and notified of approval and they will contact patient.
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
## 2014-06-10 ENCOUNTER — Other Ambulatory Visit: Payer: Self-pay | Admitting: *Deleted

## 2014-06-10 DIAGNOSIS — M549 Dorsalgia, unspecified: Secondary | ICD-10-CM

## 2014-06-11 MED ORDER — HYDROCODONE-ACETAMINOPHEN 10-325 MG PO TABS: 1.0000 | ORAL_TABLET | Freq: Four times a day (QID) | ORAL | Status: DC | PRN

## 2014-06-11 NOTE — Telephone Encounter (Signed)
Called, lm for rtc 

## 2014-06-16 ENCOUNTER — Other Ambulatory Visit: Payer: Self-pay | Admitting: Internal Medicine

## 2014-06-16 ENCOUNTER — Encounter: Payer: Medicaid Other | Admitting: Physical Therapy

## 2014-06-17 ENCOUNTER — Ambulatory Visit: Payer: Medicaid Other | Admitting: Physical Therapy

## 2014-06-24 ENCOUNTER — Other Ambulatory Visit: Payer: Self-pay | Admitting: Internal Medicine

## 2014-07-02 ENCOUNTER — Other Ambulatory Visit: Payer: Self-pay | Admitting: Internal Medicine

## 2014-07-03 NOTE — Telephone Encounter (Signed)
Patient was given a hard copy  Patient saw TP in Jan 2016 Per AVS patient needed 3 mos f/u with MR. No appt scheduled therefore Losartan refilled #30/0 RF and note to scheduled appointment. No future refills.

## 2014-07-21 ENCOUNTER — Ambulatory Visit (INDEPENDENT_AMBULATORY_CARE_PROVIDER_SITE_OTHER): Payer: Medicaid Other | Admitting: Internal Medicine

## 2014-07-21 ENCOUNTER — Encounter: Payer: Self-pay | Admitting: Internal Medicine

## 2014-07-21 VITALS — BP 138/86 | HR 82 | Temp 98.4°F | Ht 64.0 in | Wt 179.5 lb

## 2014-07-21 DIAGNOSIS — J4489 Other specified chronic obstructive pulmonary disease: Secondary | ICD-10-CM

## 2014-07-21 DIAGNOSIS — M75122 Complete rotator cuff tear or rupture of left shoulder, not specified as traumatic: Secondary | ICD-10-CM | POA: Diagnosis not present

## 2014-07-21 DIAGNOSIS — F17200 Nicotine dependence, unspecified, uncomplicated: Secondary | ICD-10-CM

## 2014-07-21 DIAGNOSIS — J45909 Unspecified asthma, uncomplicated: Secondary | ICD-10-CM

## 2014-07-21 DIAGNOSIS — M549 Dorsalgia, unspecified: Secondary | ICD-10-CM

## 2014-07-21 DIAGNOSIS — G4700 Insomnia, unspecified: Secondary | ICD-10-CM | POA: Diagnosis not present

## 2014-07-21 DIAGNOSIS — E785 Hyperlipidemia, unspecified: Secondary | ICD-10-CM | POA: Diagnosis not present

## 2014-07-21 DIAGNOSIS — J449 Chronic obstructive pulmonary disease, unspecified: Secondary | ICD-10-CM | POA: Diagnosis not present

## 2014-07-21 DIAGNOSIS — I10 Essential (primary) hypertension: Secondary | ICD-10-CM | POA: Diagnosis not present

## 2014-07-21 DIAGNOSIS — Z114 Encounter for screening for human immunodeficiency virus [HIV]: Secondary | ICD-10-CM | POA: Diagnosis not present

## 2014-07-21 LAB — LIPID PANEL
Cholesterol: 135 mg/dL (ref 0–200)
HDL: 38 mg/dL — ABNORMAL LOW (ref >=40)
LDL Cholesterol: 58 mg/dL (ref 0–99)
Total CHOL/HDL Ratio: 3.6 Ratio
Triglycerides: 196 mg/dL — ABNORMAL HIGH (ref <150)
VLDL: 39 mg/dL (ref 0–40)

## 2014-07-21 MED ORDER — NICOTINE 14 MG/24HR TD PT24: 14.0000 mg | MEDICATED_PATCH | TRANSDERMAL | Status: AC

## 2014-07-21 MED ORDER — HYDROCODONE-ACETAMINOPHEN 10-325 MG PO TABS: 1.0000 | ORAL_TABLET | Freq: Four times a day (QID) | ORAL | Status: DC | PRN

## 2014-07-21 MED ORDER — LOSARTAN POTASSIUM 25 MG PO TABS: 25.0000 mg | ORAL_TABLET | Freq: Every day | ORAL | Status: DC

## 2014-07-21 NOTE — Progress Notes (Signed)
   Subjective:    Patient ID: Vernon Carpenter, male    DOB: 20-Nov-1960, 54 y.o.   MRN: [id number]  HPI  54 yo male with hx oF COPD/Asthma, cervical disc disease, disabling back pain, also recent rupture of left rotator cuff s/p sx 03/21/14, here for follow up and med refill  Stopped taking trazodone b/c it's not helping with insomnia but making it worse.  Continues to have some left arm pain but better. Received PT for this with improvement.  Takes norco 10-325mg  #120 tab monthly for back pain. Continues to have back pain but somewhat better after steroid injection by ortho. Has another injection planned for coming Friday.  No other complaints.   Review of Systems  Constitutional: Negative for fever, chills and fatigue.  HENT: Negative for congestion and sore throat.   Eyes: Negative for photophobia and visual disturbance.  Respiratory: Negative for chest tightness, shortness of breath and wheezing.   Cardiovascular: Negative for chest pain, palpitations and leg swelling.  Gastrointestinal: Negative for nausea, vomiting, abdominal pain, diarrhea and abdominal distention.  Endocrine: Negative.   Genitourinary: Negative.   Musculoskeletal: Positive for back pain and arthralgias.  Skin: Negative.   Allergic/Immunologic: Negative.   Neurological: Negative for dizziness and numbness.  Psychiatric/Behavioral: Negative.       Objective:   Physical Exam  Constitutional: He is oriented to person, place, and time. He appears well-developed and well-nourished. No distress.  HENT:  Head: Normocephalic and atraumatic.  Mouth/Throat: Oropharynx is clear and moist.  Eyes: Conjunctivae are normal. Pupils are equal, round, and reactive to light. Right eye exhibits no discharge. Left eye exhibits no discharge.  Neck: Normal range of motion.  Cardiovascular: Normal rate and regular rhythm.  Exam reveals no gallop and no friction rub.   No murmur heard. Pulmonary/Chest: Effort normal and breath  sounds normal. No respiratory distress. He has no wheezes. He has no rales. He exhibits no tenderness.  Abdominal: Soft. Bowel sounds are normal. He exhibits no distension. There is no tenderness.  Musculoskeletal: Normal range of motion. He exhibits no edema.  Has ttp on left shoulder. ROM full but pain with movement. Right shoulder normal.  Has tender to palpation on left lower para spinal area.   Neurological: He is alert and oriented to person, place, and time.  Skin: Skin is warm. He is not diaphoretic.    Filed Vitals:   07/21/14 1326  BP: 138/86  Pulse: 82  Temp: 98.4 F (36.9 C)         Assessment & Plan:  See problem based a&p.

## 2014-07-21 NOTE — Assessment & Plan Note (Signed)
Continues to have back pain. Has ttp on lower back, L>R. Intermittent radiation to the left leg. Some weakness.  Had steroid injection with Dr. Ron Agee, has another appt for injection per patient on coming Friday.  Stable on norco 10-325mg  q6hr prn, refilled #120 tabs for 3 months.  This should last him until about 10/21/2014.

## 2014-07-21 NOTE — Assessment & Plan Note (Signed)
Trazodone not working, makes insomnia worse.  Asked him to not take anything now, explained the risk of mixing opiate and sedatives.   He said he will try without any meds.

## 2014-07-21 NOTE — Assessment & Plan Note (Signed)
Well controlled. Cont losartan 25mg  daily

## 2014-07-21 NOTE — Patient Instructions (Signed)
Please follow up with your ortho doctor for your shoulder pain and back pain.   We will check your lipid and will let you know whether you need to be on a new med.  Follow up in 3 months.

## 2014-07-21 NOTE — Assessment & Plan Note (Addendum)
Is on lovastatin 20 mg daily.  No recent lipid panel.  Will check lipids today and may change his med based on findings.   Addendum:  Lipid Panel     Component Value Date/Time   CHOL 135 07/21/2014 1405   TRIG 196* 07/21/2014 1405   HDL 38* 07/21/2014 1405   CHOLHDL 3.6 07/21/2014 1405   VLDL 39 07/21/2014 1405   LDLCALC 58 07/21/2014 1405    ASCVD risk 9.8%. Guideline recommends moderate intensity statin. Lovastatin 20mg  is low intensity but his LDL is already 50's now so I don't want to increase the dose as he already has chronic pain from other sources and I don't want to cause potential muscle aching from increase lovastatin dose.

## 2014-07-21 NOTE — Assessment & Plan Note (Signed)
S/p sx by Dr. Robbie Lis 03/2014. Completed PT. Pain is better now with therapy.  Asked to follow up with Dr. Robbie Lis.

## 2014-07-21 NOTE — Assessment & Plan Note (Signed)
Compliant with dulera. Hasn't required albuterol prn that often recently.  Trying to cut down on smoking. Refilled nicotine patch.

## 2014-07-24 NOTE — Progress Notes (Signed)
Internal Medicine Clinic Attending  Case discussed with Dr. Ahmed at the time of the visit.  We reviewed the resident's history and exam and pertinent patient test results.  I agree with the assessment, diagnosis, and plan of care documented in the resident's note. 

## 2014-07-25 ENCOUNTER — Other Ambulatory Visit: Payer: Self-pay | Admitting: Internal Medicine

## 2014-07-25 DIAGNOSIS — M75122 Complete rotator cuff tear or rupture of left shoulder, not specified as traumatic: Secondary | ICD-10-CM

## 2014-07-25 DIAGNOSIS — M549 Dorsalgia, unspecified: Secondary | ICD-10-CM

## 2014-09-25 ENCOUNTER — Ambulatory Visit (INDEPENDENT_AMBULATORY_CARE_PROVIDER_SITE_OTHER): Payer: Medicaid Other | Admitting: Internal Medicine

## 2014-09-25 ENCOUNTER — Encounter: Payer: Self-pay | Admitting: Internal Medicine

## 2014-09-25 VITALS — BP 98/72 | HR 81 | Temp 97.7°F | Ht 64.0 in | Wt 176.8 lb

## 2014-09-25 DIAGNOSIS — R3919 Other difficulties with micturition: Secondary | ICD-10-CM | POA: Diagnosis not present

## 2014-09-25 DIAGNOSIS — M549 Dorsalgia, unspecified: Secondary | ICD-10-CM

## 2014-09-25 DIAGNOSIS — Z114 Encounter for screening for human immunodeficiency virus [HIV]: Secondary | ICD-10-CM | POA: Diagnosis present

## 2014-09-25 DIAGNOSIS — M545 Low back pain: Secondary | ICD-10-CM

## 2014-09-25 DIAGNOSIS — R39198 Other difficulties with micturition: Secondary | ICD-10-CM | POA: Insufficient documentation

## 2014-09-25 LAB — POCT URINALYSIS DIPSTICK
BILIRUBIN UA: NEGATIVE
Blood, UA: NEGATIVE
Glucose, UA: NEGATIVE
KETONES UA: NEGATIVE
LEUKOCYTES UA: NEGATIVE
NITRITE UA: NEGATIVE
PH UA: 6
Spec Grav, UA: >=1.03
Urobilinogen, UA: 0.2

## 2014-09-25 MED ORDER — HYDROCODONE-ACETAMINOPHEN 10-325 MG PO TABS: 1.0000 | ORAL_TABLET | Freq: Four times a day (QID) | ORAL | Status: DC | PRN

## 2014-09-25 NOTE — Assessment & Plan Note (Signed)
Steroid injection on 08/2014 did not help much. Has lower back pain L>R, with radiation to left side. Having L sided subjective weakness.   Asked to see Dr. Ron Agee (ortho) again to discuss surgical options. This has been going on for many years and it's affective his function/lifestyle. I think surgery would be beneficial if possible as he has failed PT and steroid injection.   Refilled norco #120 for 1 month.

## 2014-09-25 NOTE — Assessment & Plan Note (Addendum)
Has interrupted urinary stream. No dysuria or hematuria, n/v/fever/chills. No CVA tenderness  I suspect this 2/2 to anti-cholinergic side effect of norco. This could also be 2/2 to possible BPH. Did not perform rectal exam today.   Urine dipstick is normal. Will get UA to make sure we don't see any blood.   Asked to let us know if having worsening of symptoms.

## 2014-09-25 NOTE — Progress Notes (Signed)
   Subjective:    Patient ID: Vernon Carpenter, male    DOB: 12-Dec-1960, 54 y.o.   MRN: [id number]  HPI  54 yo pleasant male with hx of COPD/asthma, cervical disc disease, disabling chronic back pain, and also recent left rotator cuf rupture s/p sx 03/21/14 here for follow up on his back pain.   Takes norco 10-325mg  #120 monthly for back pain. Had steroid injection by ortho. Not any improvement with that. Still having lower back pain with radiation to both legs, L>R. Having some weakness on the left. Taking norco + gabapentin.    Also having some trouble with intermittent urine stream. No n/v/fever/chills, no hematuria.   Review of Systems  Constitutional: Negative for fever, chills and fatigue.  HENT: Negative for congestion and sore throat.   Eyes: Negative for photophobia and visual disturbance.  Respiratory: Negative for chest tightness, shortness of breath and wheezing.   Endocrine: Negative for polyuria.  Genitourinary: Negative for dysuria, urgency, frequency, hematuria, flank pain, discharge, penile pain and testicular pain.       Intermittent urine stream  Musculoskeletal: Positive for back pain. Negative for joint swelling and neck pain.  Skin: Negative.   Neurological: Negative for dizziness, light-headedness and headaches.  Hematological: Negative.        Objective:   Physical Exam  Constitutional: He is oriented to person, place, and time. He appears well-developed and well-nourished. No distress.  Pleasant male.  HENT:  Head: Normocephalic and atraumatic.  Mouth/Throat: Oropharynx is clear and moist. No oropharyngeal exudate.  Eyes: EOM are normal. Pupils are equal, round, and reactive to light.  Neck: Normal range of motion.  Cardiovascular: Normal rate and regular rhythm.  Exam reveals no gallop and no friction rub.   No murmur heard. Pulmonary/Chest: Effort normal and breath sounds normal. No respiratory distress. He exhibits no tenderness.  Abdominal: Soft.  Bowel sounds are normal. He exhibits no distension. There is no tenderness.  No CVA tenderness  Musculoskeletal: Normal range of motion.  Has tenderness on lower back L>R. Has subjective weakness on left leg but 5/5 str on exam. Able to stand unsupported without falling.   Neurological: He is alert and oriented to person, place, and time. No cranial nerve deficit.  Skin: Skin is warm. No rash noted. He is not diaphoretic. No erythema.  Psychiatric: He has a normal mood and affect.   Filed Vitals:   09/25/14 0912  BP: 98/72  Pulse: 81  Temp: 97.7 F (36.5 C)         Assessment & Plan:  See problem based a&p.

## 2014-09-25 NOTE — Patient Instructions (Addendum)
Call Dr. Carolin Coy office- discuss surgical options.   Come back and see me in 1 month after you see Dr. Ron Agee. You can see me later if your appt is delayed with Dr. Ron Agee.

## 2014-09-26 LAB — URINALYSIS, ROUTINE W REFLEX MICROSCOPIC
Bilirubin, UA: NEGATIVE
GLUCOSE, UA: NEGATIVE
Ketones, UA: NEGATIVE
Leukocytes, UA: NEGATIVE
NITRITE UA: NEGATIVE
PH UA: 6 (ref 5.0–7.5)
Protein, UA: NEGATIVE
RBC, UA: NEGATIVE
Specific Gravity, UA: 1.026 (ref 1.005–1.030)
Urobilinogen, Ur: 0.2 mg/dL (ref 0.2–1.0)

## 2014-09-26 NOTE — Progress Notes (Signed)
Medicine attending: Medical history, presenting problems, physical findings, and medications, reviewed with Dr Dellia Nims on the day of the patient visit  and I concur with his evaluation and management plan.

## 2014-10-16 ENCOUNTER — Other Ambulatory Visit: Payer: Self-pay | Admitting: Internal Medicine

## 2014-10-17 ENCOUNTER — Telehealth: Payer: Self-pay | Admitting: *Deleted

## 2014-10-17 NOTE — Telephone Encounter (Signed)
Benjamine Mola from [facility] called about [employer] [employer] States to fill 30 days after last Rx - which 30 days would be tomorrow 10/18/14. [facility] is only open Mon-Fri.  Was given premission to fill. Note sent to Dr Genene Churn. Hilda Blades Ditzler RN 10/17/14 10:20AM

## 2014-10-20 ENCOUNTER — Encounter: Payer: Self-pay | Admitting: Internal Medicine

## 2014-10-20 ENCOUNTER — Ambulatory Visit (INDEPENDENT_AMBULATORY_CARE_PROVIDER_SITE_OTHER): Payer: Medicaid Other | Admitting: Internal Medicine

## 2014-10-20 VITALS — BP 140/82 | HR 72 | Temp 98.0°F | Ht 64.0 in | Wt 177.5 lb

## 2014-10-20 DIAGNOSIS — I10 Essential (primary) hypertension: Secondary | ICD-10-CM | POA: Diagnosis not present

## 2014-10-20 DIAGNOSIS — M545 Low back pain: Secondary | ICD-10-CM | POA: Diagnosis not present

## 2014-10-20 DIAGNOSIS — M549 Dorsalgia, unspecified: Secondary | ICD-10-CM

## 2014-10-20 DIAGNOSIS — Z114 Encounter for screening for human immunodeficiency virus [HIV]: Secondary | ICD-10-CM | POA: Diagnosis not present

## 2014-10-20 DIAGNOSIS — G8929 Other chronic pain: Secondary | ICD-10-CM | POA: Diagnosis not present

## 2014-10-20 DIAGNOSIS — Z23 Encounter for immunization: Secondary | ICD-10-CM | POA: Diagnosis not present

## 2014-10-20 DIAGNOSIS — Z79891 Long term (current) use of opiate analgesic: Secondary | ICD-10-CM

## 2014-10-20 DIAGNOSIS — Z0000 Encounter for general adult medical examination without abnormal findings: Secondary | ICD-10-CM

## 2014-10-20 DIAGNOSIS — M75122 Complete rotator cuff tear or rupture of left shoulder, not specified as traumatic: Secondary | ICD-10-CM

## 2014-10-20 MED ORDER — HYDROCODONE-ACETAMINOPHEN 10-325 MG PO TABS: 1.0000 | ORAL_TABLET | Freq: Four times a day (QID) | ORAL | Status: DC | PRN

## 2014-10-20 MED ORDER — HYDROCODONE-ACETAMINOPHEN 10-325 MG PO TABS
1.0000 | ORAL_TABLET | Freq: Four times a day (QID) | ORAL | Status: DC | PRN
Start: 2014-10-20 — End: 2015-01-05

## 2014-10-20 NOTE — Assessment & Plan Note (Signed)
Gave flut shot.

## 2014-10-20 NOTE — Progress Notes (Signed)
   Subjective:    Patient ID: Vernon Carpenter, male    DOB: 12/22/1960, 54 y.o.   MRN: [id number]  HPI  54 yo with hx of stable COPD/asthma, cervical disc disease, disabling chronic back pain, and also left rotator cuff tear s/p sx 03/21/14 here for follow up of back pain and shoulder pain.  Seen Dr. Mardelle Matte 09/04/14. Dr. Mardelle Matte wanted to repeat MRI to evaluate his left shoulder rotator cuff but patient wanted to wait it out and see if pain improves. He was supposed to follow up with Dr. Mardelle Matte in 6 weeks and talk about MRI if pain is not better.  Still having shoulder pain, improving from before. Shoulder pain is worse with extension past 90 degrees.   Has chronic back pain, unchanged from before. Has itnermittent radiation down the left leg. Scheduled for injection with Dr. Ron Agee on 10/23/14.   Copd stable on dulera. Uses albuterol very rarely.  Still smoking 10 cigarettes/day. Trying to quit.   Review of Systems  Constitutional: Negative for fever and chills.  HENT: Negative for congestion and sore throat.   Eyes: Negative for pain, discharge and visual disturbance.  Respiratory: Negative for cough, chest tightness, shortness of breath and wheezing.   Cardiovascular: Negative for chest pain, palpitations and leg swelling.  Gastrointestinal: Negative for nausea, diarrhea, abdominal distention and anal bleeding.  Genitourinary: Negative for dysuria and difficulty urinating.  Musculoskeletal: Positive for back pain and arthralgias. Negative for joint swelling and neck pain.  Skin: Negative.  Negative for rash.  Allergic/Immunologic: Negative.   Neurological: Negative for dizziness, weakness, numbness and headaches.  Hematological: Negative.   Psychiatric/Behavioral: Negative.        Objective:   Physical Exam  Constitutional: He is oriented to person, place, and time. He appears well-developed and well-nourished. No distress.  HENT:  Head: Normocephalic and atraumatic.    Mouth/Throat: Oropharynx is clear and moist. No oropharyngeal exudate.  Eyes: Conjunctivae and EOM are normal. Pupils are equal, round, and reactive to light. Right eye exhibits no discharge. Left eye exhibits no discharge. No scleral icterus.  Neck: Neck supple. No thyromegaly present.  Cardiovascular: Normal rate, regular rhythm, S1 normal, S2 normal and normal heart sounds.  Exam reveals no gallop and no friction rub.   No murmur heard. Pulmonary/Chest: Effort normal and breath sounds normal. No respiratory distress. He has no wheezes. He has no rales. He exhibits no tenderness.  Abdominal: Soft. Bowel sounds are normal. He exhibits no distension and no mass. There is no tenderness. There is no rebound and no guarding.  Musculoskeletal:  Has tenderness on left shoulder and limited ROM due to pain.   Has pain on the paraspinal muscles on the lumber region.  Lymphadenopathy:    He has no cervical adenopathy.  Neurological: He is alert and oriented to person, place, and time. He has normal strength and normal reflexes. No cranial nerve deficit or sensory deficit.  Skin: No rash noted. He is not diaphoretic. No erythema. No pallor.  Psychiatric: He has a normal mood and affect.     Filed Vitals:   10/20/14 1401  BP: 140/82  Pulse: 72  Temp: 98 F (36.7 C)        Assessment & Plan:  See problem based a&p.

## 2014-10-20 NOTE — Assessment & Plan Note (Addendum)
Having chronic back pain. Unchanged, has L>R pain. Has occasional radiation to the leg on left.  Has appt with Dr. Ron Agee for steroid injection on 10/23/14. Plan is trial of steroid injection. May proceed with surgery if doesn't improve with injection.  Pain helped by Meloxicam + Norco 10-325mg . Checked UDS.   Refilled norco script for 2 months. Should last until 12/26/2014.

## 2014-10-20 NOTE — Progress Notes (Signed)
Internal Medicine Clinic Attending  Case discussed with Dr. Ahmed at the time of the visit.  We reviewed the resident's history and exam and pertinent patient test results.  I agree with the assessment, diagnosis, and plan of care documented in the resident's note. 

## 2014-10-20 NOTE — Assessment & Plan Note (Signed)
Well controlled. Continue Losartan 25mg  daily.  Filed Vitals:   10/20/14 1401  BP: 140/82  Pulse: 72  Temp: 98 F (36.7 C)

## 2014-10-20 NOTE — Patient Instructions (Addendum)
Gave you flu shot.  Follow up with Dr. Mardelle Matte and Dr. Ron Agee.    F/up in 3 months.

## 2014-10-20 NOTE — Assessment & Plan Note (Signed)
Still having pain even after surgery on 03/2014. Has follow up with Dr. Mardelle Matte. Possible MRI per his last visit note. Will f/up their recs.

## 2014-10-24 LAB — PRESCRIPTION ABUSE MONITORING 17P, URINE
6-ACETYLMORPHINE, URINE: NEGATIVE ng/mL
AMPHETAMINE SCREEN URINE: NEGATIVE ng/mL
BARBITURATE SCREEN URINE: NEGATIVE ng/mL
BENZODIAZEPINE SCREEN, URINE: NEGATIVE ng/mL
Buprenorphine, Urine: NEGATIVE ng/mL
CANNABINOIDS UR QL SCN: NEGATIVE ng/mL
CARISOPRODOL/MEPROBAMATE, UR: NEGATIVE ng/mL
CREATININE(CRT), U: 75.8 mg/dL (ref 20.0–300.0)
Cocaine (Metab) Scrn, Ur: NEGATIVE ng/mL
EDDP, URINE: NEGATIVE ng/mL
Fentanyl, Urine: NEGATIVE pg/mL
MDMA SCREEN, URINE: NEGATIVE ng/mL
MEPERIDINE SCREEN, URINE: NEGATIVE ng/mL
METHADONE SCREEN, URINE: NEGATIVE ng/mL
Nitrite Urine, Quantitative: NEGATIVE ug/mL
OXYCODONE+OXYMORPHONE UR QL SCN: NEGATIVE ng/mL
PHENCYCLIDINE QUANTITATIVE URINE: NEGATIVE ng/mL
Ph of Urine: 6.1 (ref 4.5–8.9)
Propoxyphene Scrn, Ur: NEGATIVE ng/mL
SPECIFIC GRAVITY: 1.012
Tapentadol, Urine: NEGATIVE ng/mL
Tramadol Screen, Urine: NEGATIVE ng/mL

## 2014-10-24 LAB — OPIATES CONFIRMATION, URINE
Codeine: NEGATIVE
HYDROCODONE CONFIRM: 1380 ng/mL
HYDROMORPHONE: POSITIVE — AB
Hydrocodone: POSITIVE — AB
Hydromorphone Confirm: 306 ng/mL
Morphine: NEGATIVE
OPIATES: POSITIVE ng/mL — AB

## 2014-10-27 ENCOUNTER — Other Ambulatory Visit: Payer: Self-pay | Admitting: Internal Medicine

## 2014-10-27 ENCOUNTER — Encounter: Payer: Medicaid Other | Admitting: Internal Medicine

## 2014-11-04 NOTE — Telephone Encounter (Signed)
Pt saw Dr Genene Churn at [facility] 10/20/14.

## 2014-12-12 ENCOUNTER — Encounter: Payer: Self-pay | Admitting: Internal Medicine

## 2014-12-12 ENCOUNTER — Ambulatory Visit (INDEPENDENT_AMBULATORY_CARE_PROVIDER_SITE_OTHER): Payer: Medicaid Other | Admitting: Internal Medicine

## 2014-12-12 VITALS — BP 140/92 | HR 68 | Ht 64.0 in | Wt 171.0 lb

## 2014-12-12 DIAGNOSIS — J4540 Moderate persistent asthma, uncomplicated: Secondary | ICD-10-CM | POA: Diagnosis not present

## 2014-12-12 DIAGNOSIS — R0600 Dyspnea, unspecified: Secondary | ICD-10-CM

## 2014-12-12 DIAGNOSIS — R0689 Other abnormalities of breathing: Secondary | ICD-10-CM | POA: Diagnosis not present

## 2014-12-12 LAB — NITRIC OXIDE: Nitric Oxide: 6

## 2014-12-12 NOTE — Patient Instructions (Signed)
ICD-9-CM ICD-10-CM   1. Asthma, moderate persistent, uncomplicated 123456 123456 Spirometry with Graph  2. Dyspnea and respiratory abnormality 786.09 R06.00     R06.89    Asthma stable However, you have symptoms out of proportion to what I would expect Glad you are up-to-date with her flu shot  Plan - Do high resolution CT chest without contrast - Continue Dulera 2 puff 2 times daily Use albuterol as needed  Follow-up - We will call you with the CT chest results - Otherwise routine follow-up in 6 months; come sooner if needed

## 2014-12-12 NOTE — Addendum Note (Signed)
Addended by: Maurice March on: 12/12/2014 05:34 PM   Modules accepted: Orders
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
## 2014-12-19 ENCOUNTER — Ambulatory Visit (INDEPENDENT_AMBULATORY_CARE_PROVIDER_SITE_OTHER)
Admission: RE | Admit: 2014-12-19 | Discharge: 2014-12-19 | Disposition: A | Discharge status: Home / Self Care | Payer: Medicaid Other | Source: Ambulatory Visit | Attending: Internal Medicine | Admitting: Internal Medicine

## 2014-12-19 DIAGNOSIS — J4540 Moderate persistent asthma, uncomplicated: Secondary | ICD-10-CM

## 2014-12-19 DIAGNOSIS — R0689 Other abnormalities of breathing: Secondary | ICD-10-CM | POA: Diagnosis not present

## 2014-12-19 DIAGNOSIS — R0600 Dyspnea, unspecified: Secondary | ICD-10-CM | POA: Diagnosis not present

## 2014-12-22 ENCOUNTER — Telehealth: Payer: Self-pay | Admitting: Internal Medicine

## 2014-12-22 NOTE — Telephone Encounter (Signed)
Called and spoke to pt. Informed him of the results and recs per MR. Pt verbalized understanding. First available appt scheduled for 03/10/15, MR please advise if this is too far out. Thanks.

## 2014-12-22 NOTE — Telephone Encounter (Signed)
  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 This is very different from asthma. REC: quit smoking 2. Give first avail to discuss implications of this -   Thanks  Dr. Brand Males, M.D., [facility].C.P Pulmonary and Critical Care Medicine Staff Physician [place] [facility] Pager: [phone], If no answer or between  15:00h - 7:00h: call [phone]  12/22/2014 11:18 AM

## 2014-12-24 NOTE — Telephone Encounter (Signed)
That is fine 

## 2014-12-26 NOTE — Telephone Encounter (Signed)
LMTCB x1 for pt.  

## 2014-12-29 NOTE — Telephone Encounter (Signed)
Called and spoke to pt. Informed pt to keep 03/2015 appt. Pt verbalized understanding and denied any further questions or concerns at this time.

## 2015-01-05 ENCOUNTER — Ambulatory Visit (INDEPENDENT_AMBULATORY_CARE_PROVIDER_SITE_OTHER): Payer: Medicaid Other | Admitting: Internal Medicine

## 2015-01-05 ENCOUNTER — Encounter: Payer: Self-pay | Admitting: Internal Medicine

## 2015-01-05 VITALS — BP 120/79 | HR 78 | Temp 98.2°F | Ht 64.0 in | Wt 177.4 lb

## 2015-01-05 DIAGNOSIS — M549 Dorsalgia, unspecified: Secondary | ICD-10-CM

## 2015-01-05 DIAGNOSIS — I10 Essential (primary) hypertension: Secondary | ICD-10-CM | POA: Diagnosis not present

## 2015-01-05 DIAGNOSIS — J84115 Respiratory bronchiolitis interstitial lung disease: Secondary | ICD-10-CM | POA: Insufficient documentation

## 2015-01-05 DIAGNOSIS — Z114 Encounter for screening for human immunodeficiency virus [HIV]: Secondary | ICD-10-CM | POA: Diagnosis present

## 2015-01-05 DIAGNOSIS — M7710 Lateral epicondylitis, unspecified elbow: Secondary | ICD-10-CM | POA: Insufficient documentation

## 2015-01-05 DIAGNOSIS — M7711 Lateral epicondylitis, right elbow: Secondary | ICD-10-CM | POA: Diagnosis not present

## 2015-01-05 DIAGNOSIS — J4540 Moderate persistent asthma, uncomplicated: Secondary | ICD-10-CM

## 2015-01-05 MED ORDER — HYDROCODONE-ACETAMINOPHEN 10-325 MG PO TABS: 1.0000 | ORAL_TABLET | Freq: Four times a day (QID) | ORAL | Status: DC | PRN

## 2015-01-05 MED ORDER — LOSARTAN POTASSIUM 25 MG PO TABS: 25.0000 mg | ORAL_TABLET | Freq: Every day | ORAL | Status: DC

## 2015-01-05 NOTE — Assessment & Plan Note (Signed)
Based on exam finding, has tenderness on lateral epichondyle region. Likely overused right arm with his previous left rotator cuff tear.  Asked to rest, apply ice, continue to treat his pain with norco.

## 2015-01-05 NOTE — Patient Instructions (Signed)
Gave you refill for your pain meds for 3 months.

## 2015-01-05 NOTE — Assessment & Plan Note (Signed)
Taking dulera BID. Asked him to use albuterol when he gets SOB. Has f/up with pulm 03/2015.

## 2015-01-05 NOTE — Progress Notes (Signed)
   Subjective:    Patient ID: Vernon Carpenter, male    DOB: [birth year], 54 y.o.   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  Still having daily lower back pain. 8/10. Able to do chores with the pain meds, otherwise he is not able to do any activity.   Takes dulera for asthma, gets SOB with activity, feels better at rest. Does not use albuterol often. Stills smokes 1/2 PPD. Has nicotine patch, expressed he will use it more often from now.  Has some pain at the lateral elbow region for last 2 months. Hurts to palpate, no ROM problems.  Review of Systems  Constitutional: Negative for fever and chills.  HENT: Negative for congestion and sore throat.   Eyes: Negative for pain, discharge and visual disturbance.  Respiratory: Positive for shortness of breath. Negative for cough, chest tightness and wheezing.   Cardiovascular: Negative for chest pain, palpitations and leg swelling.  Gastrointestinal: Negative for nausea, diarrhea, abdominal distention and anal bleeding.  Genitourinary: Negative for dysuria and difficulty urinating.  Musculoskeletal: Positive for back pain and arthralgias. Negative for joint swelling and neck pain.  Skin: Negative.  Negative for rash.  Allergic/Immunologic: Negative.   Neurological: Negative for dizziness, weakness, numbness and headaches.  Hematological: Negative.   Psychiatric/Behavioral: Negative.        Objective:   Physical Exam  Constitutional: He is oriented to person, place, and time. He appears well-developed and well-nourished. No distress.  Pleasant male.  HENT:  Head: Normocephalic and atraumatic.  Mouth/Throat: Oropharynx is clear and moist. No oropharyngeal exudate.  Eyes: EOM are normal. Pupils are equal, round, and reactive to light.  Neck: Normal range of motion.  Cardiovascular: Normal rate and regular  rhythm.  Exam reveals no gallop and no friction rub.   No murmur heard. Pulmonary/Chest: Effort normal and breath sounds normal. No respiratory distress. He exhibits no tenderness.  Abdominal: Soft. Bowel sounds are normal. He exhibits no distension. There is no tenderness.  No CVA tenderness  Musculoskeletal: Normal range of motion.  Has tenderness on lower back paraspinal muscles.  5/5 stregnth on upper and lower exts.  Has tenderness to palpation on right lateral epicondyle area. Good ROM.  Neurological: He is alert and oriented to person, place, and time. No cranial nerve deficit.  Skin: Skin is warm. No rash noted. He is not diaphoretic. No erythema.  Psychiatric: He has a normal mood and affect.    Filed Vitals:   01/05/15 1422  BP: 120/79  Pulse: 78  Temp: 98.2 F (36.8 C)         Assessment & Plan:  See problem based a&P.

## 2015-01-05 NOTE — Assessment & Plan Note (Signed)
Saw. Dr Ron Agee for injection. States that the injection did not help that much. Still having 8/10 pain. Able to do some chores with the pain meds, otherwise not able to function without the pain meds.  Went over goal for pain medicine: to be able to do some chores and IADLS. Which he is able to maintain with opioids.  Filled norco 10-325 q6hr prn #120 tabs for 3 months. This should last until 04/05/2015.

## 2015-01-05 NOTE — Assessment & Plan Note (Signed)
Filed Vitals:   01/05/15 1422  BP: 120/79  Pulse: 78  Temp: 98.2 F (36.8 C)   Well controlled. Continue current meds: losartan 25mg  daily.

## 2015-01-05 NOTE — Assessment & Plan Note (Signed)
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 Likely from his smoking history. Still doing 1/2 PPD.   Discussed that quitting smoking is the first step to help with this as he does get sob daily with activity.   Offered nicotine gum. He states he has patches which he will use more regularly and let us know if he wants more help.

## 2015-01-09 NOTE — Addendum Note (Signed)
Addended by: Lalla Brothers T on: 01/09/2015 01:42 PM   Modules accepted: Level of Service

## 2015-01-09 NOTE — Progress Notes (Signed)
Internal Medicine Clinic Attending  Case discussed with Dr. Ahmed soon after the resident saw the patient.  We reviewed the resident's history and exam and pertinent patient test results.  I agree with the assessment, diagnosis, and plan of care documented in the resident's note. 

## 2015-01-16 IMAGING — CR DG CHEST 2V
2 series · 2 of 2 positions shown · non-contrast
Comparison: Chest radiograph 11/02/2011

CLINICAL DATA: Chest pain

CHEST - 2 VIEW

[w chest pa]
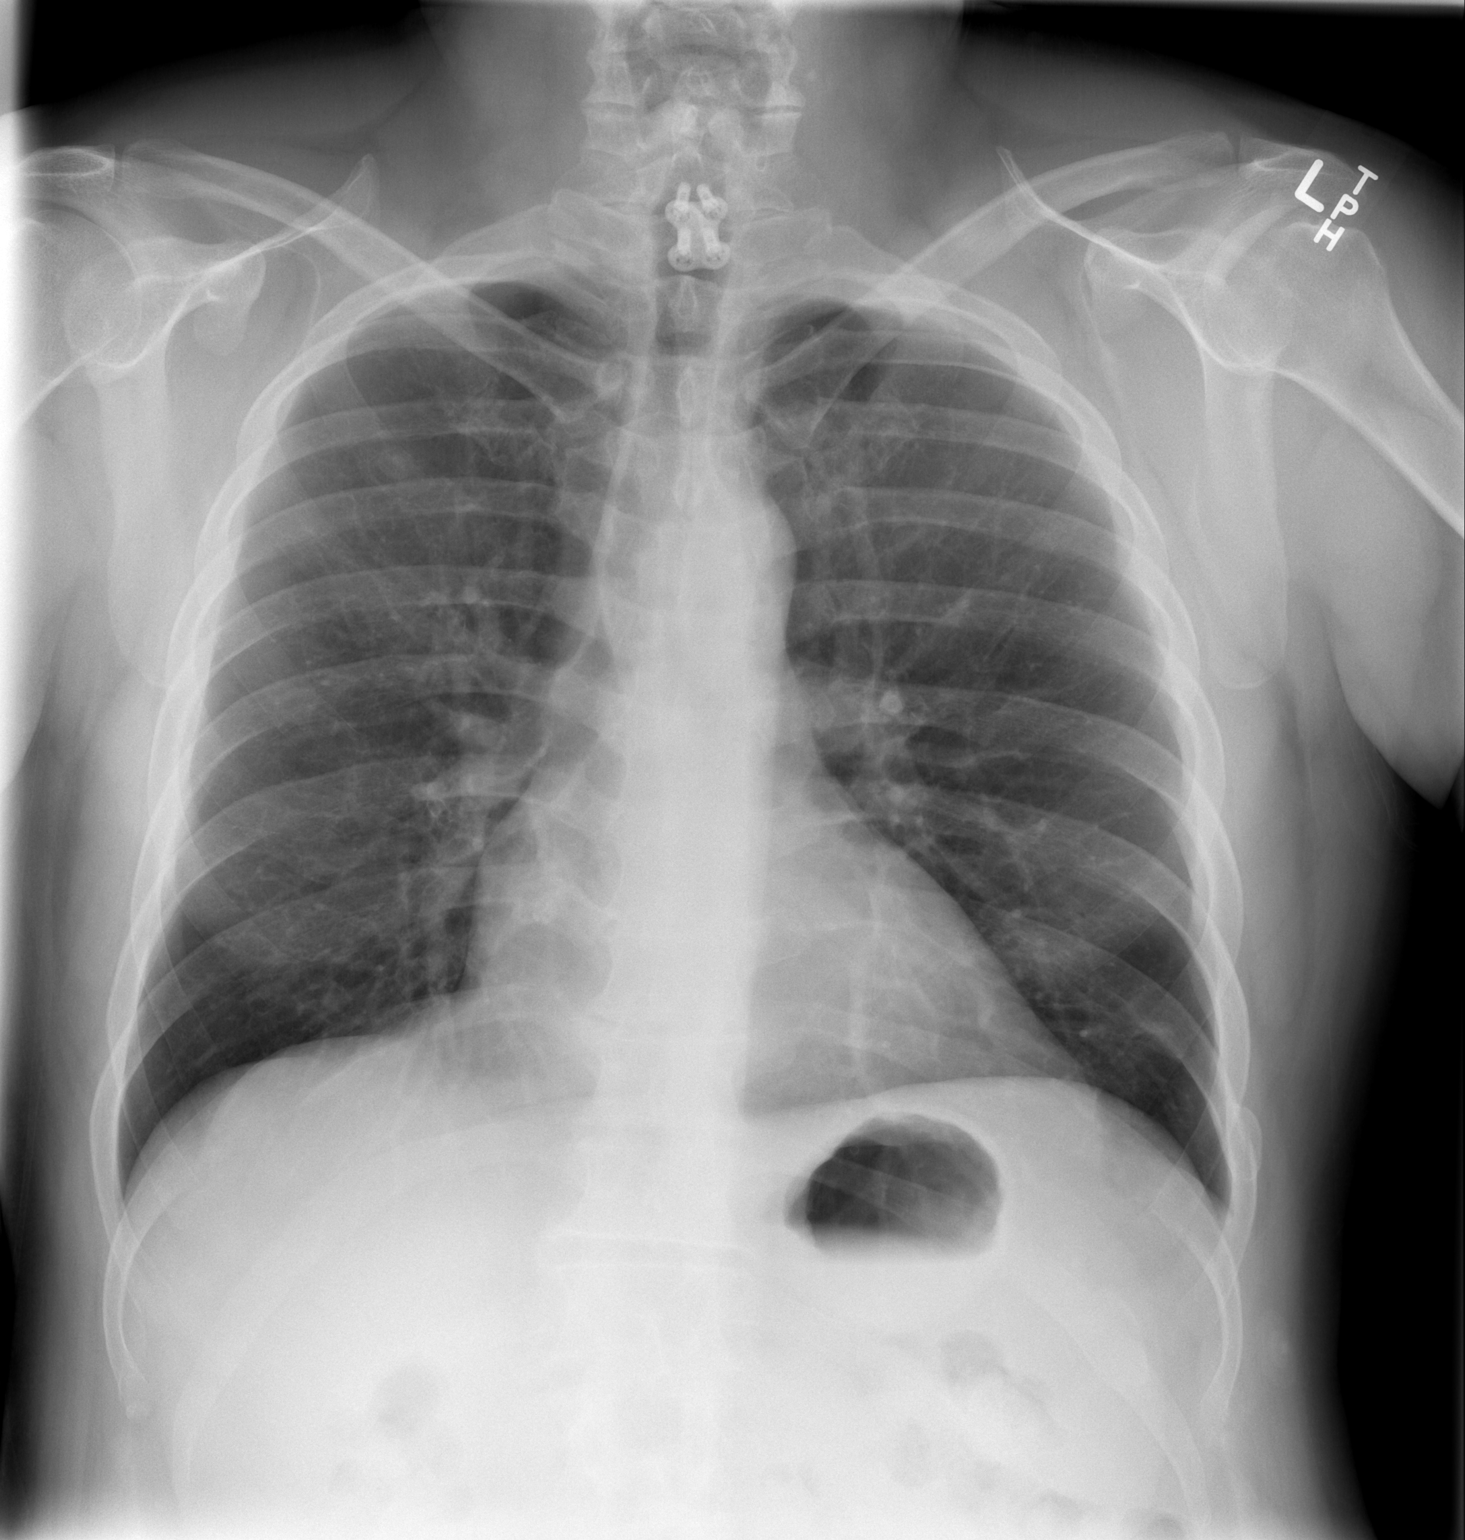

[w chest lat]
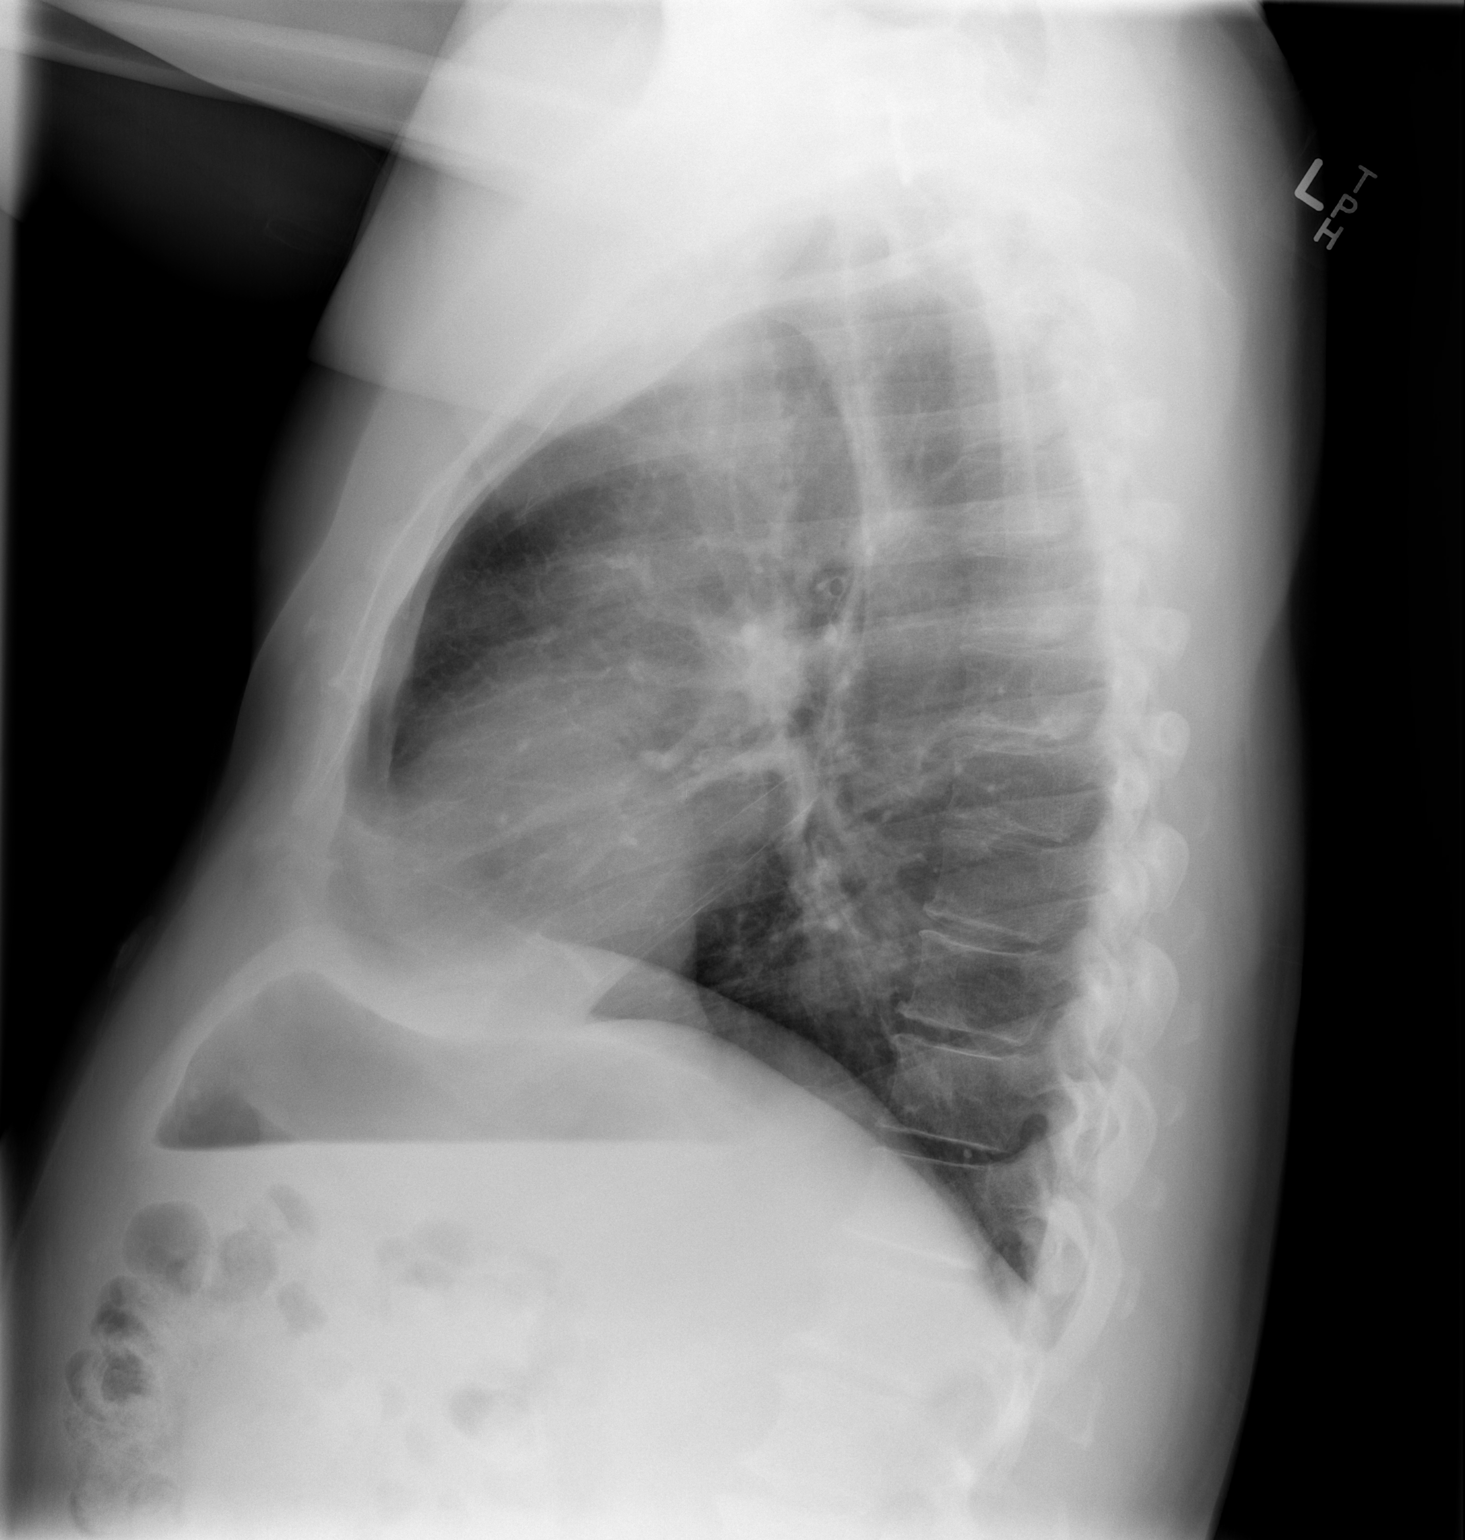

[2 of 2 positions shown; findings below may reference images not displayed]

FINDINGS: The heart, mediastinal, and hilar contours are normal.
The pulmonary vascularity is normal.  The lungs are well expanded
and clear.  Negative for pleural effusion.  No acute osseous
abnormality.  Remote healed left ninth and eleventh rib fractures
are stable. Postsurgical changes of the lower cervical spine noted.
IMPRESSION: No acute cardiopulmonary disease.

## 2015-02-12 MED FILL — PANTOPRAZOLE SOD DR 40 MG T: 40 | 30 days supply | Qty: 30 | Fill #4

## 2015-02-12 MED FILL — GABAPENTIN 300 MG CAPSULE: 300 | 30 days supply | Qty: 90 | Fill #4

## 2015-02-16 MED FILL — HYDROCODON-APAP 10-325: 10-325 | 30 days supply | Qty: 120 | Fill #0

## 2015-02-23 MED FILL — LOVASTATIN 20 MG TABLET: 20 | 30 days supply | Qty: 30 | Fill #4

## 2015-02-23 MED FILL — LOSARTAN POTASSIUM 25 MG TA: 25 | 30 days supply | Qty: 30 | Fill #1

## 2015-03-09 MED FILL — NICOTINE 14 MG/24HR PATCH: 14 | 28 days supply | Qty: 28 | Fill #1

## 2015-03-10 ENCOUNTER — Ambulatory Visit (INDEPENDENT_AMBULATORY_CARE_PROVIDER_SITE_OTHER): Payer: Medicaid Other | Admitting: Internal Medicine

## 2015-03-10 ENCOUNTER — Encounter: Payer: Self-pay | Admitting: Internal Medicine

## 2015-03-10 ENCOUNTER — Other Ambulatory Visit: Payer: Medicaid Other

## 2015-03-10 VITALS — BP 120/76 | HR 78 | Ht 64.0 in | Wt 176.8 lb

## 2015-03-10 DIAGNOSIS — Z720 Tobacco use: Secondary | ICD-10-CM

## 2015-03-10 DIAGNOSIS — J84115 Respiratory bronchiolitis interstitial lung disease: Secondary | ICD-10-CM | POA: Insufficient documentation

## 2015-03-10 DIAGNOSIS — R0689 Other abnormalities of breathing: Secondary | ICD-10-CM

## 2015-03-10 DIAGNOSIS — R0600 Dyspnea, unspecified: Secondary | ICD-10-CM

## 2015-03-10 DIAGNOSIS — F17200 Nicotine dependence, unspecified, uncomplicated: Secondary | ICD-10-CM

## 2015-03-10 NOTE — Progress Notes (Signed)
Subjective:     Patient ID: Vernon Carpenter, male   DOB: 08/28/1960, 55 y.o.   MRN: [id number]  HPI   05/09/2014  Chief Complaint  Patient presents with  . Follow-up    Pt states he has constant SOB, dry cough, wheezing. Denies any chest tightness/congestion, n/v/d or f/c/s.     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 In the last few months he was in the [employer] study. He was screened for that but he screen failed because he relapsed and the smoking. Today is a routine regular clinic visit. He says that he continues to smoke. His relapsed and the smoking is because of a lot of social stressors which she declines to elaborate other than "crazy woman". A few months ago he also slipped and fell in the rain and ruptured several tendons around his left shoulders and he is currently no sling. For the last few weeks he quit taking his [facility] and he feels he is in an exacerbation with increased wheezing shortness of breath and chest tightness.  - FeNO - < 5 but is actively wheezing - Eos in past 400cells/microliter - Chest x-ray 10/11/2013: Clear -  reports that he has been smoking Cigarettes.  He has a 3 pack-year smoking history. He has never used smokeless tobacco.    OV 12/12/2014  Chief Complaint  Patient presents with  . Follow-up    Pt states his SOB is at baseline, still c/o DOE - when walking up stairs. Pt c/o wheezing, dry cough. Pt denies CP/tightness .   Follow-up moderate persistent asthma on Dulera in the setting of smoking  Last seen April 2016. Since then he says that his asthma stable but he continues to be symptomatic with cough and shortness of breath. He is a very poor historian and is unable to quantify how bad he is. He is now status post left shoulder surgery several months ago. He still having chronic pain. Most recently he says he got a cortisone injection in his left shoulder and this has helped him. He still smokes but he says he is nearly quit. Primary  care physician has him on a nicotine patch. He is up-to-date with his flu shot    Asthma control 5. Questionnaire score is 3.4 and shows significant amount of symptoms. Specifically he says that he wakes up in the middle of the night a few times. He has mild symptoms of asthma when he wakes up in the morning. He is moderately limited in his activities because of asthma. He is also dyspneic quite a lot. He is also wheezing all of the time. 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 Follow-up    Pt here after CT chest to discuss results. Pt states his breathing is unchanged since last OV. Pt c/o dry cough and chest pain under left breast when showering, pt states he thinks it is a cramp.    Follow-up suppose that asthma  He continues to be extremity symptomatic. He says he uses his Dulera less than half of the time. However he says it does not make a difference. He still continues to have cough shortness of breath and wheeze. He still smokes and is finding it difficult  to quit. Unclear if he has any substance abuse history but he wont admitted. He continues to report that back pain is significant. In fact sometimes it is more significant than respiration is. The back pain is chronic. Looking for alternative diagnosis I did a high-resolution CT chest that suggest possible mild respiratory bronchiolitis interstitial lung disease in the upper lobes. Nevertheless he is significantly symptomatic.   Current outpatient prescriptions:  .  albuterol (PROVENTIL) (2.5 MG/3ML) 0.083% nebulizer solution, Take 3 mLs (2.5 mg total) by nebulization every 6 (six) hours as needed for wheezing., Disp: 75 mL, Rfl: 12 .  gabapentin (NEURONTIN) 300 MG capsule, Take 1 capsule (300 mg total)  by mouth 3 (three) times daily., Disp: 90 capsule, Rfl: 3 .  HYDROcodone-acetaminophen (NORCO) 10-325 MG tablet, Take 1 tablet by mouth every 6 (six) hours as needed for moderate pain or severe pain., Disp: 120 tablet, Rfl: 0 .  losartan (COZAAR) 25 MG tablet, Take 1 tablet (25 mg total) by mouth daily., Disp: 30 tablet, Rfl: 5 .  lovastatin (MEVACOR) 20 MG tablet, TAKE 1 TABLET BY MOUTH DAILY., Disp: 30 tablet, Rfl: 11 .  mometasone-formoterol (DULERA) 200-5 MCG/ACT AERO, Inhale 2 puffs into the lungs 2 (two) times daily., Disp: 1 Inhaler, Rfl: 5 .  nicotine (NICODERM CQ - DOSED IN MG/24 HOURS) 14 mg/24hr patch, Place 1 patch (14 mg total) onto the skin daily., Disp: 30 patch, Rfl: 11 .  ondansetron (ZOFRAN) 4 MG tablet, Take 1 tablet (4 mg total) by mouth every 8 (eight) hours as needed for nausea or vomiting., Disp: 30 tablet, Rfl: 0 .  pantoprazole (PROTONIX) 40 MG tablet, TAKE 1 TABLET BY MOUTH DAILY., Disp: 30 tablet, Rfl: 11 .  sennosides-docusate sodium (SENOKOT-S) 8.6-50 MG tablet, Take 2 tablets by mouth daily., Disp: 30 tablet, Rfl: 1 .  aspirin 81 MG tablet, Take 81 mg by mouth daily. Reported on 03/10/2015, Disp: , Rfl:   No Known Allergies  Immunization History  Administered Date(s) Administered  . Influenza Split 11/07/2011  . Influenza,inj,Quad PF,36+ Mos 11/02/2012, 11/11/2013, 10/20/2014  . Td 03/20/2012     reports that he has been smoking Cigarettes.  He has a 3 pack-year smoking history. He has never used smokeless tobacco.    Review of Systems     Objective:   Physical Exam  Constitutional: He is oriented to person, place, and time. He appears well-developed and well-nourished. No distress.  Looks a bit more disheveled  HENT:  Head: Normocephalic and atraumatic.  Right Ear: External ear normal.  Left Ear: External ear normal.  Mouth/Throat: Oropharynx is clear and moist. No oropharyngeal exudate.  Eyes: Conjunctivae and EOM are normal. Pupils are equal, round,  and reactive to light. Right eye exhibits no discharge. Left eye exhibits no discharge. No scleral icterus.  Neck: Normal range of motion. Neck supple. No JVD present. No tracheal deviation present. No thyromegaly present.  Cardiovascular: Normal rate, regular rhythm and intact distal pulses.  Exam reveals no gallop and no friction rub.   No murmur heard. Pulmonary/Chest: Effort normal. No respiratory distress. He has wheezes. He has no rales. He exhibits no tenderness.  Very faint occasional scattered wheeze improved than before.  Abdominal: Soft. Bowel sounds are normal. He exhibits no distension and no mass. There is no tenderness. There is no rebound and no guarding.  Musculoskeletal: Normal range of motion. He exhibits no edema or tenderness.  Lymphadenopathy:    He has no cervical adenopathy.  Neurological: He is alert  and oriented to person, place, and time. He has normal reflexes. No cranial nerve deficit. Coordination normal.  Skin: Skin is warm and dry. No rash noted. He is not diaphoretic. No erythema. No pallor.  Psychiatric: He has a normal mood and affect. His behavior is normal. Judgment and thought content normal.  Flat affect  Nursing note and vitals reviewed.   Filed Vitals:   03/10/15 1615  BP: 120/76  Pulse: 78  Height: 5\' 4"  (1.626 m)  Weight: 176 lb 12.8 oz (80.196 kg)  SpO2: 95%        Assessment:       ICD-9-CM ICD-10-CM   1. Dyspnea and respiratory abnormality 786.09 R06.00 Cardiopulmonary exercise test    R06.89 Drug Screen, Urine  2. 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 Smoking 305.1 Z72.0        Plan:     Still unclear his source of breath very symptoms. His respiratory bronchitis interstitial lung disease is very mild. He continues to smoke. Need to ensure this no pain conversion syndrome. Therefore we'll get urine drug screen. We'll also get a cardio pulmonary stress  test off Dulera. It is not able to tolerate coming off Dulera then he will come back sooner or call us.   (> 50% of this 15 min visit spent in face to face counseling or/and coordination of care)   Dr. Brand Males, M.D., [facility].C.P Pulmonary and Critical Care Medicine Staff Physician [place] [facility] Pager: [phone], If no answer or between  15:00h - 7:00h: call [phone]  03/10/2015 4:50 PM

## 2015-03-10 NOTE — Patient Instructions (Addendum)
ICD-9-CM ICD-10-CM   1. Dyspnea and respiratory abnormality 786.09 R06.00     R06.89   2. 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 Smoking 305.1 Z72.0    I cannot fully understand why are you so symptomatic Do urine drug screen  See if you can stop dulera for 2 weeks and then do CPST bike test (pulmonary stress test)   Followup  - return to see me after CPST bike test  - If stopping the inhaler makes her feel worse please call us or return to office sooner

## 2015-03-12 LAB — DRUG SCREEN, URINE
Amphetamine Screen, Ur: NEGATIVE
BENZODIAZEPINES.: NEGATIVE
Barbiturate Quant, Ur: NEGATIVE
COCAINE METABOLITES: NEGATIVE
CREATININE, U: 157.87 mg/dL
Marijuana Metabolite: NEGATIVE
Methadone: NEGATIVE
Opiates: POSITIVE — AB
PHENCYCLIDINE (PCP): NEGATIVE
Propoxyphene: NEGATIVE

## 2015-03-16 MED FILL — PANTOPRAZOLE SOD DR 40 MG T: 40 | 30 days supply | Qty: 30 | Fill #5

## 2015-03-16 MED FILL — GABAPENTIN 300 MG CAPSULE: 300 | 30 days supply | Qty: 90 | Fill #5

## 2015-03-17 ENCOUNTER — Encounter (HOSPITAL_COMMUNITY): Payer: Medicaid Other

## 2015-03-18 MED FILL — HYDROCODON-APAP 10-325: 10-325 | 30 days supply | Qty: 120 | Fill #0

## 2015-03-25 ENCOUNTER — Encounter (HOSPITAL_COMMUNITY): Payer: Medicaid Other

## 2015-03-26 MED FILL — LOSARTAN POTASSIUM 25 MG TA: 25 | 30 days supply | Qty: 30 | Fill #2

## 2015-03-26 MED FILL — LOVASTATIN 20 MG TABLET: 20 | 30 days supply | Qty: 30 | Fill #5

## 2015-03-31 ENCOUNTER — Ambulatory Visit (HOSPITAL_COMMUNITY): Payer: Medicaid Other | Attending: Internal Medicine

## 2015-03-31 DIAGNOSIS — R0689 Other abnormalities of breathing: Secondary | ICD-10-CM | POA: Diagnosis not present

## 2015-03-31 DIAGNOSIS — J84115 Respiratory bronchiolitis interstitial lung disease: Secondary | ICD-10-CM | POA: Diagnosis not present

## 2015-03-31 DIAGNOSIS — R0600 Dyspnea, unspecified: Secondary | ICD-10-CM | POA: Diagnosis present

## 2015-04-01 ENCOUNTER — Ambulatory Visit (INDEPENDENT_AMBULATORY_CARE_PROVIDER_SITE_OTHER): Payer: Medicaid Other | Admitting: Internal Medicine

## 2015-04-01 ENCOUNTER — Encounter: Payer: Self-pay | Admitting: Internal Medicine

## 2015-04-01 VITALS — BP 110/85 | HR 88 | Temp 98.0°F | Wt 174.8 lb

## 2015-04-01 VITALS — BP 134/78 | HR 73 | Ht 64.0 in | Wt 174.2 lb

## 2015-04-01 DIAGNOSIS — M25511 Pain in right shoulder: Secondary | ICD-10-CM | POA: Diagnosis not present

## 2015-04-01 DIAGNOSIS — M549 Dorsalgia, unspecified: Secondary | ICD-10-CM

## 2015-04-01 DIAGNOSIS — R0689 Other abnormalities of breathing: Secondary | ICD-10-CM | POA: Diagnosis not present

## 2015-04-01 DIAGNOSIS — M25512 Pain in left shoulder: Secondary | ICD-10-CM

## 2015-04-01 DIAGNOSIS — R0600 Dyspnea, unspecified: Secondary | ICD-10-CM

## 2015-04-01 DIAGNOSIS — F17200 Nicotine dependence, unspecified, uncomplicated: Secondary | ICD-10-CM | POA: Diagnosis not present

## 2015-04-01 DIAGNOSIS — R5381 Other malaise: Secondary | ICD-10-CM | POA: Diagnosis not present

## 2015-04-01 DIAGNOSIS — Z720 Tobacco use: Secondary | ICD-10-CM

## 2015-04-01 DIAGNOSIS — M545 Low back pain: Secondary | ICD-10-CM

## 2015-04-01 MED ORDER — MONTELUKAST SODIUM 10 MG PO TABS: 10.0000 mg | ORAL_TABLET | Freq: Every day | ORAL | Status: DC

## 2015-04-01 MED ORDER — HYDROCODONE-ACETAMINOPHEN 10-325 MG PO TABS: 1.0000 | ORAL_TABLET | Freq: Four times a day (QID) | ORAL | Status: DC | PRN

## 2015-04-01 MED FILL — MONTELUKAST SOD 10 MG TAB: 10 | 30 days supply | Qty: 30 | Fill #0

## 2015-04-01 NOTE — Assessment & Plan Note (Signed)
Discussed smoking cessation. currently has cut down to less than 1 pack  Day, used to do 1-1.5 pack a day in the past. Using nicotine patch.

## 2015-04-01 NOTE — Assessment & Plan Note (Addendum)
We have discussed the need to tapering his opioid meds. Has tried meloxicam, muscle relaxer, ibuprofen in the past without relief. Taking norco 4 tabs a day currently.  We will start tapering it today. He will be taking 3 tablets a day, will give him some extra for the days with severe pain. We will taper from 120 tabs /month to 100tabs/month of norco 10-325mg . Gave him 3 months supply. Should last until 07/06/2015. UDS 1 week ago at pulmonary office.   Referred back to Dr. Mardelle Matte ortho to discuss options for his back pain.

## 2015-04-01 NOTE — Patient Instructions (Signed)
ICD-9-CM ICD-10-CM   1. Dyspnea and respiratory abnormality 786.09 R06.00     R06.89   2. Physical deconditioning 799.3 R53.81     Shortness of breath is due to multiple reasons including possible inflammation in the lung tissue. Were not sure that you have asthma although you might have exercise induced spasm of your breathing tubes. You are significantly decondition and in addition because of a weight and high blood pressure your heart muscle doesn't relax well when your exercise or exert. This is called diastolic dysfunction  Plan - Stop Dulera - Trial Singulair 10 mg once a day at night (take generic prescription] - Refer pulmonary rehabilitation - Quit smoking - Work with pain doctor to control chronic pain  Follow up - 4 months or sooner if needed - At some point in the next 6-12 months will consider CT chest -=

## 2015-04-01 NOTE — Patient Instructions (Addendum)
Continue to cut down on our opioid medication as much as you can.  See Dr. Mardelle Matte and discuss your options: surgery vs injections for your back.

## 2015-04-01 NOTE — Progress Notes (Signed)
   Subjective:    Patient ID: Vernon Carpenter, male    DOB: May 27, 1960, 55 y.o.   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  Back pain: on norco 10-325mg  q6hr PRN #120 tablets. Has back pain on lower back b/l. Takes 4 norcos, with good relief, without pain medicine he is not able to walk or do any activity. Needs refill. Last UDS 03/10/15. He is agreeable to cut down on the medication to 3 tablets a day with some extra tablet for days when he has severe pain = 100 tabs per month.  He had surgery on his left shoulder 1 year ago and he is having some pain in this area again. He would like to go back to Dr. Mardelle Matte who operated on his shoulder. Asking for a referral.    Review of Systems  Constitutional: Negative for fever, chills and fatigue.  HENT: Negative for congestion and sore throat.   Eyes: Negative for photophobia and visual disturbance.  Respiratory: Negative for chest tightness and shortness of breath.   Cardiovascular: Negative for chest pain, palpitations and leg swelling.  Gastrointestinal: Negative for abdominal pain and abdominal distention.  Endocrine: Negative for polyphagia and polyuria.  Musculoskeletal: Positive for back pain and arthralgias.  Skin: Negative.   Allergic/Immunologic: Negative.   Neurological: Negative.   Hematological: Negative.   Psychiatric/Behavioral: Negative.        Objective:   Physical Exam  Constitutional: He is oriented to person, place, and time. No distress.  Pleasant man, NAD  HENT:  Head: Normocephalic and atraumatic.  Eyes: Conjunctivae are normal. Pupils are equal, round, and reactive to light.  Neck: Normal range of motion.  Cardiovascular: Normal rate and regular rhythm.  Exam reveals no friction rub.   No murmur heard. Pulmonary/Chest: Effort normal and breath sounds normal.  Distant breathe sounds  Abdominal: Soft.  Bowel sounds are normal. He exhibits no distension. There is no tenderness.  Musculoskeletal: Normal range of motion.  Left shoulder slightly limited ROM with extension passed 90 degrees. No tenderness to palpation. Normal strength.  Mild tenderness to palpation on lower back muscles.   Neurological: He is alert and oriented to person, place, and time.  Skin: He is not diaphoretic.     Filed Vitals:   04/01/15 0850  BP: 110/85  Pulse: 88  Temp: 98 F (36.7 C)        Assessment & Plan:  See problem based a&p.

## 2015-04-01 NOTE — Assessment & Plan Note (Addendum)
Had surgery in the past for rotatory cuff tear on 03/21/14 by Dr. Mardelle Matte. Pain was getting better but now has increased pain again with some ROM limitation b/c of pain. Wants to see Dr. Mardelle Matte again.  Will refer back to Dr. Mardelle Matte.
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
## 2015-04-02 LAB — NITRIC OXIDE: NITRIC OXIDE: 5

## 2015-04-02 NOTE — Progress Notes (Signed)
Internal Medicine Clinic Attending  Case discussed with Dr. Ahmed at the time of the visit.  We reviewed the resident's history and exam and pertinent patient test results.  I agree with the assessment, diagnosis, and plan of care documented in the resident's note. 

## 2015-04-02 NOTE — Addendum Note (Signed)
Addended by: Collier Salina on: 04/02/2015 09:32 AM   Modules accepted: Orders

## 2015-04-13 ENCOUNTER — Telehealth (HOSPITAL_COMMUNITY): Payer: Self-pay

## 2015-04-13 MED FILL — PANTOPRAZOLE SOD DR 40 MG T: 40 | 30 days supply | Qty: 30 | Fill #6

## 2015-04-13 MED FILL — GABAPENTIN 300 MG CAPSULE: 300 | 30 days supply | Qty: 90 | Fill #6

## 2015-04-14 MED FILL — HYDROCODON-APAP 10-325: 10-325 | 25 days supply | Qty: 100 | Fill #0

## 2015-04-23 MED FILL — LOVASTATIN 20 MG TABLET: 20 | 30 days supply | Qty: 30 | Fill #6

## 2015-04-23 MED FILL — LOSARTAN POTASSIUM 25 MG TA: 25 | 30 days supply | Qty: 30 | Fill #3

## 2015-04-27 DIAGNOSIS — R0600 Dyspnea, unspecified: Secondary | ICD-10-CM | POA: Diagnosis not present

## 2015-05-01 MED FILL — MONTELUKAST SOD 10 MG TAB: 10 | 30 days supply | Qty: 30 | Fill #1

## 2015-05-04 ENCOUNTER — Encounter: Payer: Self-pay | Admitting: Internal Medicine

## 2015-05-04 ENCOUNTER — Ambulatory Visit (INDEPENDENT_AMBULATORY_CARE_PROVIDER_SITE_OTHER): Payer: Medicaid Other | Admitting: Internal Medicine

## 2015-05-04 VITALS — BP 133/99 | HR 76 | Temp 98.2°F | Ht 64.0 in | Wt 174.1 lb

## 2015-05-04 DIAGNOSIS — Z1159 Encounter for screening for other viral diseases: Secondary | ICD-10-CM

## 2015-05-04 DIAGNOSIS — M549 Dorsalgia, unspecified: Secondary | ICD-10-CM

## 2015-05-04 DIAGNOSIS — I10 Essential (primary) hypertension: Secondary | ICD-10-CM

## 2015-05-04 DIAGNOSIS — M47816 Spondylosis without myelopathy or radiculopathy, lumbar region: Secondary | ICD-10-CM

## 2015-05-04 DIAGNOSIS — M4806 Spinal stenosis, lumbar region: Secondary | ICD-10-CM

## 2015-05-04 MED ORDER — LOSARTAN POTASSIUM 50 MG PO TABS: 25.0000 mg | ORAL_TABLET | Freq: Every day | ORAL | Status: DC

## 2015-05-04 NOTE — Assessment & Plan Note (Signed)
Filed Vitals:   05/04/15 1435  BP: 133/99  Pulse: 76  Temp: 98.2 F ([id number] C)   Diastolic elevated. Compliant with losartan 25mg  daily. Will increase to 50mg  daily. Will check BMET today as we are going up.

## 2015-05-04 NOTE — Patient Instructions (Addendum)
Please see your ortho Dr. To discuss surgical options.  Continue to taper down your pain medications as much as possible  Follow up in 2 months for pain medication refill.   Will increase your losartan (for BP) to 50mg  daily

## 2015-05-04 NOTE — Progress Notes (Signed)
   Subjective:    Patient ID: Vernon Carpenter, male    DOB: April 30, 1960, 55 y.o.   MRN: [id number]  HPI  55 yo male with chronic resp disease 2/2 to asthma/COPD + deconditioning, HTN, chronic back pain, here for f/up of HTN and  back pain.  Back pain: on norco 10-325mg  q6hr PRN #100 tablets. We tapered down to 100 tablets ( from 120 tablets) last time.  Continues to have back pain on lower back b/l. Takes 3-4 norcos, with good relief, without pain medicine. He is not able to walk or do any activity without the pain medicine. Just Saw Dr. Ron Agee for back injection, which only lasted 2-3 days. He has appt to see him again tomorrow, they will be discussing whether surgery is an option for him given he had this pain for many (~10+ years).   HTN: BP slightly up today. On losartan 25mg  daily now.   Review of Systems  Constitutional: Negative for fever, chills and fatigue.  HENT: Negative for congestion and sore throat.   Eyes: Negative for photophobia and visual disturbance.  Respiratory: Negative for chest tightness and shortness of breath.   Cardiovascular: Negative for chest pain, palpitations and leg swelling.  Gastrointestinal: Negative for abdominal pain and abdominal distention.  Endocrine: Negative for polyphagia and polyuria.  Musculoskeletal: Positive for back pain and arthralgias.  Skin: Negative.   Allergic/Immunologic: Negative.   Neurological: Negative.   Hematological: Negative.   Psychiatric/Behavioral: Negative.        Objective:   Physical Exam  Constitutional: He is oriented to person, place, and time. No distress.  Pleasant man, NAD  HENT:  Head: Normocephalic and atraumatic.  Eyes: Conjunctivae are normal. Pupils are equal, round, and reactive to light.  Neck: Normal range of motion.  Cardiovascular: Normal rate and regular rhythm.  Exam reveals no friction rub.   No murmur heard. Pulmonary/Chest: Effort normal and breath sounds normal.  Distant breathe sounds    Abdominal: Soft. Bowel sounds are normal. He exhibits no distension. There is no tenderness.  Musculoskeletal: Normal range of motion.  Left shoulder ROM is slightly limited due to pain with movement. .  Mild tenderness to palpation on lower back muscles and spinous processes.   Neurological: He is alert and oriented to person, place, and time.  Skin: He is not diaphoretic.     Filed Vitals:   05/04/15 1435  BP: 133/99  Pulse: 76  Temp: 98.2 F (36.8 C)        Assessment & Plan:  See problem based a&p.

## 2015-05-04 NOTE — Assessment & Plan Note (Signed)
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 Has tried injections with only temporary relief (2-3 days). Asked Vernon Carpenter to discuss surgical option with Vernon Carpenter tomorrow during his f/up appt. He had this pain for over 10 years now and it's not improving.   Currently onl norco 10-325mg  q6hr PRN, we tapered Vernon Carpenter down from 120 to 100#tablets per month last visit. He has 2 more moths left, should last Vernon Carpenter until June 1st, 2017. Asked Vernon Carpenter to f/up with me before he needs refill (~2 months).

## 2015-05-05 LAB — BASIC METABOLIC PANEL
BUN/Creatinine Ratio: 15 (ref 9–20)
BUN: 13 mg/dL (ref 6–24)
CALCIUM: 9.6 mg/dL (ref 8.7–10.2)
CHLORIDE: 95 mmol/L — AB (ref 96–106)
CO2: 25 mmol/L (ref 18–29)
Creatinine, Ser: 0.88 mg/dL (ref 0.76–1.27)
GFR calc non Af Amer: 97 mL/min/{1.73_m2} (ref >59)
GFR, EST AFRICAN AMERICAN: 113 mL/min/{1.73_m2} (ref >59)
Glucose: 99 mg/dL (ref 65–99)
POTASSIUM: 4.6 mmol/L (ref 3.5–5.2)
SODIUM: 139 mmol/L (ref 134–144)

## 2015-05-05 LAB — HEPATITIS C ANTIBODY: Hep C Virus Ab: <0.1 s/co ratio (ref 0.0–0.9)

## 2015-05-05 NOTE — Progress Notes (Signed)
Internal Medicine Clinic Attending  Case discussed with Dr. Ahmed at the time of the visit.  We reviewed the resident's history and exam and pertinent patient test results.  I agree with the assessment, diagnosis, and plan of care documented in the resident's note. 

## 2015-05-14 MED FILL — GABAPENTIN 300 MG CAPSULE: 300 | 30 days supply | Qty: 90 | Fill #7

## 2015-05-14 MED FILL — PANTOPRAZOLE SOD DR 40 MG T: 40 | 30 days supply | Qty: 30 | Fill #7

## 2015-05-15 MED FILL — HYDROCODON-APAP 10-325: 10-325 | 25 days supply | Qty: 100 | Fill #0

## 2015-05-18 MED FILL — LOSARTAN POTASSIUM 25 MG TA: 25 | 30 days supply | Qty: 30 | Fill #4

## 2015-05-26 MED FILL — LOVASTATIN 20 MG TABLET: 20 | 30 days supply | Qty: 30 | Fill #7

## 2015-06-01 MED FILL — MONTELUKAST SOD 10 MG TAB: 10 | 30 days supply | Qty: 30 | Fill #2

## 2015-06-04 ENCOUNTER — Other Ambulatory Visit: Payer: Self-pay | Admitting: *Deleted

## 2015-06-04 ENCOUNTER — Telehealth: Payer: Self-pay | Admitting: Internal Medicine

## 2015-06-04 DIAGNOSIS — I10 Essential (primary) hypertension: Secondary | ICD-10-CM

## 2015-06-04 NOTE — Telephone Encounter (Signed)
The script sent 4/3 said 50mg  take 1/2 tablet daily, your note said up the losartan to 50mg  1 tab daily, need to send a new script

## 2015-06-04 NOTE — Telephone Encounter (Signed)
Pt needed new losartan script sent, done in another enc

## 2015-06-04 NOTE — Telephone Encounter (Signed)
Pt need to talk to nurse

## 2015-06-05 ENCOUNTER — Other Ambulatory Visit: Payer: Self-pay | Admitting: Neurosurgery

## 2015-06-05 ENCOUNTER — Other Ambulatory Visit (HOSPITAL_COMMUNITY): Payer: Self-pay | Admitting: Neurosurgery

## 2015-06-05 DIAGNOSIS — M4726 Other spondylosis with radiculopathy, lumbar region: Secondary | ICD-10-CM

## 2015-06-05 MED ORDER — LOSARTAN POTASSIUM 50 MG PO TABS: 50.0000 mg | ORAL_TABLET | Freq: Every day | ORAL | Status: DC

## 2015-06-05 MED FILL — LOSARTAN POTASSIUM 50 MG TA: 50 | 30 days supply | Qty: 30 | Fill #0

## 2015-06-12 MED FILL — HYDROCODON-APAP 10-325: 10-325 | 25 days supply | Qty: 100 | Fill #0

## 2015-06-12 MED FILL — GABAPENTIN 300 MG CAPSULE: 300 | 30 days supply | Qty: 90 | Fill #8

## 2015-06-15 MED FILL — PANTOPRAZOLE SOD DR 40 MG T: 40 | 30 days supply | Qty: 30 | Fill #8

## 2015-06-18 ENCOUNTER — Ambulatory Visit (HOSPITAL_COMMUNITY): Payer: Medicaid Other

## 2015-06-25 MED FILL — MONTELUKAST SOD 10 MG TAB: 10 | 30 days supply | Qty: 30 | Fill #3

## 2015-06-25 MED FILL — LOVASTATIN 20 MG TABLET: 20 | 30 days supply | Qty: 30 | Fill #8

## 2015-07-06 ENCOUNTER — Ambulatory Visit (INDEPENDENT_AMBULATORY_CARE_PROVIDER_SITE_OTHER): Payer: Medicaid Other | Admitting: Internal Medicine

## 2015-07-06 ENCOUNTER — Encounter: Payer: Self-pay | Admitting: Internal Medicine

## 2015-07-06 ENCOUNTER — Other Ambulatory Visit: Payer: Self-pay

## 2015-07-06 VITALS — BP 119/72 | HR 78 | Temp 98.1°F | Ht 64.0 in | Wt 172.6 lb

## 2015-07-06 VITALS — BP 128/78 | HR 85 | Ht 64.0 in | Wt 170.0 lb

## 2015-07-06 DIAGNOSIS — R05 Cough: Secondary | ICD-10-CM | POA: Diagnosis not present

## 2015-07-06 DIAGNOSIS — M545 Low back pain: Secondary | ICD-10-CM | POA: Diagnosis not present

## 2015-07-06 DIAGNOSIS — M25512 Pain in left shoulder: Secondary | ICD-10-CM

## 2015-07-06 DIAGNOSIS — J849 Interstitial pulmonary disease, unspecified: Secondary | ICD-10-CM | POA: Diagnosis not present

## 2015-07-06 DIAGNOSIS — I10 Essential (primary) hypertension: Secondary | ICD-10-CM | POA: Diagnosis not present

## 2015-07-06 DIAGNOSIS — R0689 Other abnormalities of breathing: Secondary | ICD-10-CM

## 2015-07-06 DIAGNOSIS — Z720 Tobacco use: Secondary | ICD-10-CM | POA: Diagnosis not present

## 2015-07-06 DIAGNOSIS — R0600 Dyspnea, unspecified: Secondary | ICD-10-CM

## 2015-07-06 DIAGNOSIS — Z87891 Personal history of nicotine dependence: Secondary | ICD-10-CM | POA: Insufficient documentation

## 2015-07-06 DIAGNOSIS — M549 Dorsalgia, unspecified: Secondary | ICD-10-CM

## 2015-07-06 DIAGNOSIS — R053 Chronic cough: Secondary | ICD-10-CM | POA: Insufficient documentation

## 2015-07-06 MED ORDER — TIOTROPIUM BROMIDE MONOHYDRATE 1.25 MCG/ACT IN AERS: 2.0000 | INHALATION_SPRAY | Freq: Every day | RESPIRATORY_TRACT | Status: DC

## 2015-07-06 MED ORDER — HYDROCODONE-ACETAMINOPHEN 10-325 MG PO TABS: 1.0000 | ORAL_TABLET | Freq: Four times a day (QID) | ORAL | Status: DC | PRN

## 2015-07-06 MED FILL — LOSARTAN POTASSIUM 50 MG TA: 50 | 30 days supply | Qty: 30 | Fill #1

## 2015-07-06 NOTE — Assessment & Plan Note (Signed)
Continues to have pain on b/l lower back. Still waiting to see a surgeon for surgical opinion which I recommended in the past. He will call them again and will let us know if he can't reach them.  - continue norco for now. Filled norco 10-325mg  q6hr prn #100 tabs per month. Doing ok with the taper we did from 110 to 100. Defer further taper until after surgery eval/teratment. F/up in 3 months.

## 2015-07-06 NOTE — Patient Instructions (Signed)
Please follow up with your doctor for your back pain and also your shoulder pain.  Continue to quit smoking.  Follow up with me in 3 months.

## 2015-07-06 NOTE — Progress Notes (Signed)
Patient ID: Vernon Carpenter, male   DOB: [birth year], 55 y.o.   MRN: [id number] Patient seen in the office today and instructed on use of Spiriva Respimat.  Patient expressed understanding and demonstrated technique.

## 2015-07-06 NOTE — Assessment & Plan Note (Signed)
Having limited ROM on the left shoulder along with pain with movement. 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 He would benefit from PT but he has medicaid which would not cover him PT.  Asking to see Dr. Mardelle Matte again.  - asked him to call Dr. Luanna Cole office for f/up appt to see him.
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
## 2015-07-06 NOTE — Patient Instructions (Addendum)
ICD-9-CM ICD-10-CM   1. Dyspnea and respiratory abnormality 786.09 R06.00     R06.89   2. Chronic cough 786.2 R05   3. Smoking history V15.82 Z72.0   4. ILD (interstitial lung disease) ([place]) 515 J84.9    Repeat High res CT chest nov 2017 Start spiriva respimat daily Continue singulair Refer pulmonary rehab Refer ENT for evaluation of throat  Followup Nov 2017 after CT chest If still symptomatic consider [facility] referral

## 2015-07-06 NOTE — Progress Notes (Signed)
   Subjective:    Patient ID: Vernon Carpenter, male    DOB: Sep 15, 1960, 55 y.o.   MRN: [id number]  HPI  55 yo with chronic cough, resp distress from asthma+deconditioning + smoking +/- ILD?, HTN, chronic disabling back pain, here for follow up of back pain and HTN. Also having right shoulder pain.   Saw Dr. Chase Caller for his pulm issues, ordered high res CT, started spiriva, cont singulair, referred to pulm rehab and to ENT.   Saw Dr. Ron Agee for his back pain. He was referred for surgical evaluation. Has not seen anyone yet as he was trying to figure out if medicaid would cover it. He has been trying to get in touch with [place] office and was told that someone will get him an appt soon. Continues to have b/l lower back pain. Takes Norco 10-325mg  q6 hr prn #100 tabs per month. Doing ok with this (tapered from 110 in the past). No significant radicular pain to the legs.   Has b/l shoulder pain, L>R. Left shoulder had rotator cuff repair 03/2014 with Dr. Mardelle Matte. Having limited ROM due to pain. Wants to see Dr. Mardelle Matte again. Asked for referral but I told him he should be able to see them without a referral since he was with them in the past.       Review of Systems  Constitutional: Negative for fever and chills.  HENT: Negative for congestion and sore throat.   Respiratory: Negative for chest tightness, shortness of breath and wheezing.   Cardiovascular: Negative for chest pain, palpitations and leg swelling.  Gastrointestinal: Negative for abdominal pain and abdominal distention.  Musculoskeletal:       B/l shoulder pain L>R B/l lower back pain  Neurological: Negative for dizziness, numbness and headaches.  Psychiatric/Behavioral: Negative for confusion and agitation.       Objective:   Physical Exam  Constitutional: He is oriented to person, place, and time. He appears well-developed and well-nourished. No distress.  Pleasant.  HENT:  Head: Normocephalic and atraumatic.    Eyes: Conjunctivae are normal. Right eye exhibits no discharge. Left eye exhibits no discharge.  Neck:  Good ROM on neck.   Cardiovascular: Normal rate and regular rhythm.  Exam reveals no gallop and no friction rub.   No murmur heard. Pulmonary/Chest: Effort normal and breath sounds normal. No respiratory distress. He has no wheezes.  Abdominal: Soft. Bowel sounds are normal. He exhibits no distension. There is no tenderness.  Musculoskeletal:  Left shoulder limited external rotation and extension. Pain with motion. Pain to palpate on the musculature and and shoulder joint. No effusion.   Neurological: He is alert and oriented to person, place, and time.  Skin: He is not diaphoretic.     Filed Vitals:   07/06/15 1312  BP: 119/72  Pulse: 78  Temp: 98.1 F (36.7 C)        Assessment & Plan:  See problem based a&p.

## 2015-07-06 NOTE — Assessment & Plan Note (Signed)
Filed Vitals:   07/06/15 1312  BP: 119/72  Pulse: 78  Temp: 98.1 F (36.7 C)   BP well controlled on losartan 25mg  daily. Cont same regimen.

## 2015-07-08 NOTE — Progress Notes (Signed)
Internal Medicine Clinic Attending  Case discussed with Dr. Ahmed at the time of the visit.  We reviewed the resident's history and exam and pertinent patient test results.  I agree with the assessment, diagnosis, and plan of care documented in the resident's note. 

## 2015-07-09 NOTE — Addendum Note (Signed)
Addended by: Dellia Nims on: 07/09/2015 04:14 PM   Modules accepted: Orders

## 2015-07-13 MED FILL — GABAPENTIN 300 MG CAPSULE: 300 | 30 days supply | Qty: 90 | Fill #9

## 2015-07-13 MED FILL — HYDROCODON-APAP 10-325: 10-325 | 30 days supply | Qty: 100 | Fill #0

## 2015-07-16 MED FILL — PANTOPRAZOLE SOD DR 40 MG T: 40 | 30 days supply | Qty: 30 | Fill #9

## 2015-07-22 MED FILL — METHYLPREDNISOLONE 4 MG TAB: 4 | 6 days supply | Qty: 21 | Fill #0

## 2015-07-27 MED FILL — LOVASTATIN 20 MG TABLET: 20 | 30 days supply | Qty: 30 | Fill #9

## 2015-07-28 ENCOUNTER — Telehealth: Payer: Self-pay | Admitting: *Deleted

## 2015-07-28 NOTE — Telephone Encounter (Signed)
[place] Medicaid states pt must try and fail Atrovent HFA, Spiriva Handihaler, Atrovent Neb solution and Duoneb before Spiriva Respimat will be approved.

## 2015-07-28 NOTE — Telephone Encounter (Signed)
Change to spiriva handihaler

## 2015-07-28 NOTE — Telephone Encounter (Signed)
LVM for pt to return call

## 2015-07-29 MED ORDER — TIOTROPIUM BROMIDE MONOHYDRATE 18 MCG IN CAPS: 18.0000 ug | ORAL_CAPSULE | Freq: Every day | RESPIRATORY_TRACT | Status: AC

## 2015-07-29 MED FILL — SPIRIVA 18 MCG CP-HANDIHALE: 18 | 30 days supply | Qty: 30 | Fill #0

## 2015-07-29 NOTE — Telephone Encounter (Signed)
Pt is returning call from yesterday.  °

## 2015-07-29 NOTE — Telephone Encounter (Signed)
Spiriva HH sent to [place] - pt aware that the pharmacy can show him how to use the inhaler but he is more than welcome to come by our office to have US demonstrate this once he picks it up since it is a new inhaler. Pt states that he will decide once he picks up the inhaler if he is going to come by. Nothing further needed.

## 2015-07-31 MED FILL — LOSARTAN POTASSIUM 50 MG TA: 50 | 30 days supply | Qty: 30 | Fill #2

## 2015-07-31 MED FILL — MONTELUKAST SOD 10 MG TAB: 10 | 30 days supply | Qty: 30 | Fill #4

## 2015-08-11 MED FILL — GABAPENTIN 300 MG CAPSULE: 300 | 30 days supply | Qty: 90 | Fill #10

## 2015-08-12 MED FILL — HYDROCODON-APAP 10-325: 10-325 | 25 days supply | Qty: 100 | Fill #0

## 2015-08-12 MED FILL — diazePAM 5 MG TABS: 5 | 2 days supply | Qty: 2 | Fill #0

## 2015-08-17 MED FILL — PANTOPRAZOLE SOD DR 40 MG T: 40 | 30 days supply | Qty: 30 | Fill #10

## 2015-08-25 MED FILL — LOVASTATIN 20 MG TABLET: 20 | 30 days supply | Qty: 30 | Fill #10

## 2015-08-31 ENCOUNTER — Other Ambulatory Visit: Payer: Self-pay | Admitting: Internal Medicine

## 2015-08-31 DIAGNOSIS — I10 Essential (primary) hypertension: Secondary | ICD-10-CM

## 2015-08-31 MED FILL — MONTELUKAST SOD 10 MG TAB: 10 | 30 days supply | Qty: 30 | Fill #5

## 2015-08-31 NOTE — Telephone Encounter (Signed)
  Of note, I also received a faxed PA request from pt's pharmacy for the losartan 50 mg tabs.  PA request submitted online via [employer] (pt failed lisinopril in the past) and request was sent for review.Despina Hidden Cassady7/31/20174:15 PM

## 2015-09-02 MED FILL — LOSARTAN POTASSIUM 50 MG TA: 50 | 30 days supply | Qty: 30 | Fill #0

## 2015-09-10 MED FILL — GABAPENTIN 300 MG CAPSULE: 300 | 30 days supply | Qty: 90 | Fill #11

## 2015-09-14 MED FILL — HYDROCODON-APAP 10-325: 10-325 | 25 days supply | Qty: 100 | Fill #0

## 2015-09-17 MED FILL — PANTOPRAZOLE SOD DR 40 MG T: 40 | 30 days supply | Qty: 30 | Fill #11

## 2015-09-24 MED FILL — LOVASTATIN 20 MG TABLET: 20 | 30 days supply | Qty: 30 | Fill #11

## 2015-10-01 MED FILL — MONTELUKAST SOD 10 MG TAB: 10 | 30 days supply | Qty: 30 | Fill #6

## 2015-10-06 MED FILL — LOSARTAN POTASSIUM 50 MG TA: 50 | 30 days supply | Qty: 30 | Fill #1

## 2015-10-12 MED FILL — GABAPENTIN 300 MG CAPSULE: 300 | 30 days supply | Qty: 90 | Fill #12

## 2015-10-19 ENCOUNTER — Ambulatory Visit (INDEPENDENT_AMBULATORY_CARE_PROVIDER_SITE_OTHER): Payer: Medicaid Other | Admitting: Internal Medicine

## 2015-10-19 ENCOUNTER — Encounter: Payer: Self-pay | Admitting: Internal Medicine

## 2015-10-19 ENCOUNTER — Other Ambulatory Visit: Payer: Self-pay | Admitting: Internal Medicine

## 2015-10-19 VITALS — BP 130/77 | HR 73 | Temp 98.4°F | Ht 64.0 in | Wt 171.7 lb

## 2015-10-19 DIAGNOSIS — Z79899 Other long term (current) drug therapy: Secondary | ICD-10-CM | POA: Diagnosis not present

## 2015-10-19 DIAGNOSIS — M545 Low back pain: Secondary | ICD-10-CM

## 2015-10-19 DIAGNOSIS — I10 Essential (primary) hypertension: Secondary | ICD-10-CM | POA: Diagnosis not present

## 2015-10-19 DIAGNOSIS — Z23 Encounter for immunization: Secondary | ICD-10-CM | POA: Diagnosis not present

## 2015-10-19 DIAGNOSIS — Z0000 Encounter for general adult medical examination without abnormal findings: Secondary | ICD-10-CM | POA: Insufficient documentation

## 2015-10-19 DIAGNOSIS — G8929 Other chronic pain: Secondary | ICD-10-CM

## 2015-10-19 DIAGNOSIS — M549 Dorsalgia, unspecified: Secondary | ICD-10-CM

## 2015-10-19 DIAGNOSIS — M25512 Pain in left shoulder: Secondary | ICD-10-CM | POA: Diagnosis not present

## 2015-10-19 DIAGNOSIS — Z79891 Long term (current) use of opiate analgesic: Secondary | ICD-10-CM

## 2015-10-19 DIAGNOSIS — M25511 Pain in right shoulder: Secondary | ICD-10-CM

## 2015-10-19 MED ORDER — HYDROCODONE-ACETAMINOPHEN 10-325 MG PO TABS: 1.0000 | ORAL_TABLET | Freq: Four times a day (QID) | ORAL | 0 refills | Status: DC | PRN

## 2015-10-19 NOTE — Assessment & Plan Note (Signed)
Gave flu shot today.

## 2015-10-19 NOTE — Assessment & Plan Note (Signed)
Still having 7/10 back pain, takes norco 4-5 times a day for the pain. Pain is located on lower back in a belt like pattern. Has some radiation to the left leg. No weakness/numbness/tingling.  Has appt coming up with orthopaedic surgery for surgical evaluation at [facility]. Follow up on their recs. Refilled pain med for norco 10-325mg  #100 tabs for 3 months. Ran out 4 days ago. This should last until 11/18/15. appt before that with me.

## 2015-10-19 NOTE — Assessment & Plan Note (Signed)
Vitals:   10/19/15 1324  BP: 130/77  Pulse: 73  Temp: 98.4 F (36.9 C)   Bp well controlled on losartan 50mg  daily. Cont this dose .

## 2015-10-19 NOTE — Assessment & Plan Note (Signed)
Continues to have left shoulder pain and also radicular pain from neck with numbness/tingling. Good str on exam.  Follow up with Dr. Ron Agee and his partner. Has appt with them soon. Continue norco for pain.

## 2015-10-19 NOTE — Progress Notes (Signed)
   CC: chronic back pain and shoulder pain  HPI:  Mr.Vernon Carpenter is a 55 y.o. with PMH as listed below including chronic back pain and shoulder pain is here for follow up on those.   Past Medical History:  Diagnosis Date  . Acute and chronic respiratory failure   . Anxiety   . Arthritis    "back" (05/15/2013)  . Asthma   . CHEST PAIN    12/2011 seen by Dr. Johnsie Cancel and cleared for hernia repair surgery  . Chronic lower back pain   . Complete rupture of left rotator cuff 03/21/2014  . COPD (chronic obstructive pulmonary disease) ([place])   . Cough   . Depression   . GERD (gastroesophageal reflux disease)   . High cholesterol   . Hypertension   . Neck pain   . Plantar fasciitis of left foot   . Umbilical hernia   . Wears dentures    top    Back pain -on norco 10-325mg  q6hrp rn #100 tabs monthly. Saw Dr. Ron Agee I in the past for steroid injection. Was supposed to see [facility] for surgical eval. Had to wait for appt as he had problem with the medicaid card, has appt coming up soon. Last UDS 03/20/15. Reviewed controlled substance database.   B/l shoulder pain ; L>R. Had rotator cuff repair 03/2014 with Dr. Mardelle Matte. Has radicular pain from the neck as well. Has numbness/tingling on left arm. Is seeing another physician in the same practice for further evaluation, planning to do some neck/Cspine imaging per patient.   Review of Systems:   Review of Systems  Constitutional: Negative for chills and fever.  Cardiovascular: Negative for chest pain and palpitations.  Genitourinary: Negative for dysuria and frequency.  Musculoskeletal: Positive for back pain and neck pain.  Neurological: Positive for tingling. Negative for weakness.     Physical Exam:  Vitals:   10/19/15 1324  BP: 130/77  Pulse: 73  Temp: 98.4 F (36.9 C)  TempSrc: Oral  SpO2: 95%  Weight: 171 lb 6.4 oz (77.7 kg)  Height: 5\' 4"  (1.626 m)   Physical Exam  Constitutional: He is oriented to person,  place, and time. He appears well-developed and well-nourished. No distress.  HENT:  Head: Normocephalic and atraumatic.  Eyes: Conjunctivae are normal. Right eye exhibits no discharge. Left eye exhibits no discharge.  Cardiovascular: Exam reveals no gallop and no friction rub.   No murmur heard. Respiratory: Effort normal and breath sounds normal. No respiratory distress. He has no wheezes.  Musculoskeletal:  L shoulder joint active motion limited slightly due to pain. Passive motion is normal.  Lumber spinal tenderness to palpation on the spinous processes and also the paraspinal muscles.    Neurological: He is alert and oriented to person, place, and time.  str 5/5 bilaterally. Sensation intact.   Skin: He is not diaphoretic.    Assessment & Plan:   See Encounters Tab for problem based charting.  Patient discussed with Dr. Daryll Drown

## 2015-10-19 NOTE — Patient Instructions (Signed)
Follow up in 3 months.  Refilled pain meds for 3 months.  Keep appt with your doctors at [facility] and [place] and also with Dr. Carolin Coy partner.

## 2015-10-20 ENCOUNTER — Telehealth: Payer: Self-pay | Admitting: *Deleted

## 2015-10-20 MED FILL — LOVASTATIN 20 MG TABLET: 20 | 30 days supply | Qty: 30 | Fill #0

## 2015-10-20 MED FILL — PANTOPRAZOLE SOD DR 40 MG T: 40 | 30 days supply | Qty: 30 | Fill #0

## 2015-10-20 NOTE — Telephone Encounter (Signed)
PA request submitted online via [place] [employer]-request approved, pt aware.Vernon Hidden Cassady9/19/20174:32 PM      [id number] [phone] [facility]  [id number] T Mads R [place] 10/20/2015  APPROVED 10/20/2015 - 04/17/2016

## 2015-10-21 MED FILL — HYDROCODON-APAP 10-325: 10-325 | 30 days supply | Qty: 100 | Fill #0

## 2015-10-21 NOTE — Telephone Encounter (Addendum)
Call from pt stating he had contacted the pharmacy, but rx was still not going through-contacted the pharmacy to confirm, call made to [facility], there was an error processing request.  Request was resubmitted and approved    [id number] [facility]  PHARMACY [id number] [place] 10/21/2015 APPROVED 10/21/2015 - 04/18/2016 DMA   Pt and pharmacy aware.Regenia Skeeter, Caelan Branden Cassady9/20/20179:18 AM

## 2015-10-27 NOTE — Progress Notes (Signed)
Internal Medicine Clinic Attending  Case discussed with Dr. Ahmed at the time of the visit.  We reviewed the resident's history and exam and pertinent patient test results.  I agree with the assessment, diagnosis, and plan of care documented in the resident's note. 

## 2015-10-30 ENCOUNTER — Other Ambulatory Visit (HOSPITAL_COMMUNITY): Payer: Self-pay | Admitting: Neurosurgery

## 2015-10-30 ENCOUNTER — Other Ambulatory Visit: Payer: Self-pay | Admitting: Neurosurgery

## 2015-10-30 DIAGNOSIS — M4726 Other spondylosis with radiculopathy, lumbar region: Secondary | ICD-10-CM

## 2015-11-02 MED FILL — LOSARTAN POTASSIUM 50 MG TA: 50 | 30 days supply | Qty: 30 | Fill #2

## 2015-11-02 MED FILL — MONTELUKAST SOD 10 MG TAB: 10 | 30 days supply | Qty: 30 | Fill #7

## 2015-11-11 ENCOUNTER — Other Ambulatory Visit: Payer: Self-pay | Admitting: Internal Medicine

## 2015-11-11 MED FILL — GABAPENTIN 300 MG CAPSULE: 300 | 30 days supply | Qty: 90 | Fill #0

## 2015-11-19 MED FILL — LOVASTATIN 20 MG TABLET: 20 | 30 days supply | Qty: 30 | Fill #1

## 2015-11-19 MED FILL — PANTOPRAZOLE SOD DR 40 MG T: 40 | 30 days supply | Qty: 30 | Fill #1

## 2015-11-20 MED FILL — HYDROCODON-APAP 10-325: 10-325 | 25 days supply | Qty: 100 | Fill #0

## 2015-12-02 ENCOUNTER — Ambulatory Visit (HOSPITAL_COMMUNITY)
Admission: RE | Admit: 2015-12-02 | Discharge: 2015-12-02 | Disposition: A | Discharge status: Home / Self Care | Payer: Medicaid Other | Source: Ambulatory Visit | Attending: Neurosurgery | Admitting: Neurosurgery

## 2015-12-02 DIAGNOSIS — M4726 Other spondylosis with radiculopathy, lumbar region: Secondary | ICD-10-CM | POA: Insufficient documentation

## 2015-12-02 DIAGNOSIS — M48061 Spinal stenosis, lumbar region without neurogenic claudication: Secondary | ICD-10-CM | POA: Insufficient documentation

## 2015-12-02 DIAGNOSIS — I700 Atherosclerosis of aorta: Secondary | ICD-10-CM | POA: Insufficient documentation

## 2015-12-02 MED ORDER — IOPAMIDOL (ISOVUE-M 200) INJECTION 41%
20.0000 mL | Freq: Once | INTRAMUSCULAR | Status: AC
  Administered 2015-12-02: 20 mL via INTRATHECAL

## 2015-12-02 MED ORDER — DIAZEPAM 5 MG PO TABS
ORAL_TABLET | ORAL | Status: AC
  Filled 2015-12-02: qty 2

## 2015-12-02 MED ORDER — DIAZEPAM 5 MG PO TABS
10.0000 mg | ORAL_TABLET | Freq: Once | ORAL | Status: AC
  Administered 2015-12-02: 10 mg via ORAL

## 2015-12-02 MED ORDER — OXYCODONE HCL 5 MG PO TABS: 5.0000 mg | ORAL_TABLET | ORAL | Status: DC | PRN

## 2015-12-02 MED ORDER — ONDANSETRON HCL 4 MG/2ML IJ SOLN: 4.0000 mg | Freq: Four times a day (QID) | INTRAMUSCULAR | Status: DC | PRN

## 2015-12-02 MED ORDER — LIDOCAINE HCL (PF) 1 % IJ SOLN
5.0000 mL | Freq: Once | INTRAMUSCULAR | Status: AC
  Administered 2015-12-02: 5 mL via INTRADERMAL

## 2015-12-02 NOTE — Discharge Instructions (Signed)
Myelography, Care After °These instructions give you information on caring for yourself after your procedure. Your doctor may also give you more specific instructions. Call your doctor if you have any problems or questions after your procedure. °HOME CARE °· Rest the first day. °· When you rest, lie flat, with your head slightly raised (elevated). °· Avoid heavy lifting and activity for 48 hours, or as told by your doctor. °· You may take the bandage (dressing) off one day after the test, or as told by your doctor. °· Take all medicines only as told by your doctor. °· Ask your doctor when it is okay to take a shower or bath. °· Ask your doctor when your test results will be ready and how you can get them. Make sure you follow up and get your results. °· Do not drink alcohol for 24 hours, or as told by your doctor. °· Drink enough fluid to keep your pee (urine) clear or pale yellow. °GET HELP IF:  °· You have a fever. °· You have a headache. °· You feel sick to your stomach (nauseous) or throw up (vomit). °· You have pain or cramping in your belly (abdomen). °GET HELP RIGHT AWAY IF:  °· You have a headache with a stiff neck or fever. °· You have trouble breathing. °· Any of the places where the needles were put in are: °¨ Puffy (swollen) or red. °¨ Sore or hot to the touch. °¨ Draining yellowish-white fluid (pus). °¨ Bleeding. °MAKE SURE YOU: °· Understand these instructions. °· Will watch your condition. °· Will get help right away if you are not doing well or get worse. °  °This information is not intended to replace advice given to you by your health care provider. Make sure you discuss any questions you have with your health care provider. °  °Document Released: 10/27/2007 Document Revised: 02/07/2014 Document Reviewed: 10/12/2011 °Elsevier Interactive Patient Education ©2016 [employer]vier Inc. ° °

## 2015-12-02 NOTE — Op Note (Signed)
  Lumbar Myelogram  PATIENT:  Vernon Carpenter is a 55 y.o. male  PRE-OPERATIVE DIAGNOSIS:  lumbago  POST-OPERATIVE DIAGNOSIS:  lumbago  PROCEDURE:  Lumbar Myelogram  SURGEON:  Saifan Rayford  ANESTHESIA:   local LOCAL MEDICATIONS USED:  LIDOCAINE  Procedure Note: Vernon Carpenter is a 55 y.o. male Was taken to the fluoroscopy suite and  positioned prone on the fluoroscopy table. His back was prepared and draped in a sterile manner. I infiltrated 7 cc into the lumbar region. I then introduced a spinal needle into the thecal sac at the 4/5 interlaminar space. I infiltrated 20cc of Isovue 200 into the thecal sac. Fluoroscopy showed the needle and contrast in the thecal sac. Vernon Carpenter tolerated the procedure well. he Will be taken to CT for evaluation.     PATIENT DISPOSITION:  Short Stay

## 2015-12-03 MED FILL — LOSARTAN POTASSIUM 50 MG TA: 50 | 30 days supply | Qty: 15 | Fill #0

## 2015-12-03 MED FILL — MONTELUKAST SOD 10 MG TAB: 10 | 30 days supply | Qty: 30 | Fill #8

## 2015-12-04 ENCOUNTER — Ambulatory Visit (INDEPENDENT_AMBULATORY_CARE_PROVIDER_SITE_OTHER)
Admission: RE | Admit: 2015-12-04 | Discharge: 2015-12-04 | Disposition: A | Discharge status: Home / Self Care | Payer: Medicaid Other | Source: Ambulatory Visit | Attending: Internal Medicine | Admitting: Internal Medicine

## 2015-12-04 DIAGNOSIS — J849 Interstitial pulmonary disease, unspecified: Secondary | ICD-10-CM

## 2015-12-04 DIAGNOSIS — R053 Chronic cough: Secondary | ICD-10-CM

## 2015-12-04 DIAGNOSIS — R0689 Other abnormalities of breathing: Secondary | ICD-10-CM

## 2015-12-04 DIAGNOSIS — R05 Cough: Secondary | ICD-10-CM | POA: Diagnosis not present

## 2015-12-04 DIAGNOSIS — R0600 Dyspnea, unspecified: Secondary | ICD-10-CM

## 2015-12-04 DIAGNOSIS — Z87891 Personal history of nicotine dependence: Secondary | ICD-10-CM

## 2015-12-07 ENCOUNTER — Ambulatory Visit (INDEPENDENT_AMBULATORY_CARE_PROVIDER_SITE_OTHER): Payer: Medicaid Other | Admitting: Internal Medicine

## 2015-12-07 ENCOUNTER — Encounter: Payer: Self-pay | Admitting: Internal Medicine

## 2015-12-07 VITALS — BP 120/60 | HR 72 | Ht 64.0 in | Wt 173.4 lb

## 2015-12-07 DIAGNOSIS — R0600 Dyspnea, unspecified: Secondary | ICD-10-CM | POA: Diagnosis not present

## 2015-12-07 DIAGNOSIS — J432 Centrilobular emphysema: Secondary | ICD-10-CM

## 2015-12-07 DIAGNOSIS — R0689 Other abnormalities of breathing: Secondary | ICD-10-CM | POA: Diagnosis not present

## 2015-12-07 DIAGNOSIS — Z87891 Personal history of nicotine dependence: Secondary | ICD-10-CM | POA: Diagnosis not present
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
## 2015-12-07 NOTE — Patient Instructions (Addendum)
ICD-9-CM ICD-10-CM   1. Dyspnea and respiratory abnormality 786.09 R06.00     R06.89   2. Centrilobular emphysema (HCC) 492.8 J43.2   3. Smoking history V15.82 Z87.891     No evidence of cancer or scar tissue You have emphysema Rest of shortness of breath is due to pain and deconditioning  PLAN RE-Start spiriva respimat daily - not sure why you stopped Continue dulera daily  Continue singulair Glad uptodate with flu shot   Followup 3-6 months or sooner if needed; Check alpha 1 at fu Will need annuyal LDCT starting age 28 which will be after 12/26/2016 -for lung ca screen

## 2015-12-14 MED FILL — GABAPENTIN 300 MG CAPSULE: 300 | 30 days supply | Qty: 90 | Fill #1

## 2015-12-21 MED FILL — PANTOPRAZOLE SOD DR 40 MG T: 40 | 30 days supply | Qty: 30 | Fill #2

## 2015-12-21 MED FILL — HYDROCODON-APAP 10-325: 10-325 | 30 days supply | Qty: 100 | Fill #0

## 2015-12-21 MED FILL — LOVASTATIN 20 MG TABLET: 20 | 30 days supply | Qty: 30 | Fill #2

## 2016-01-04 MED FILL — LOSARTAN POTASSIUM 50 MG TA: 50 | 30 days supply | Qty: 15 | Fill #1

## 2016-01-04 MED FILL — MONTELUKAST SOD 10 MG TAB: 10 | 30 days supply | Qty: 30 | Fill #9

## 2016-01-08 ENCOUNTER — Telehealth: Payer: Self-pay | Admitting: Internal Medicine

## 2016-01-08 NOTE — Telephone Encounter (Signed)
APT. REMINDER CALL, LMTCB °

## 2016-01-11 ENCOUNTER — Ambulatory Visit (INDEPENDENT_AMBULATORY_CARE_PROVIDER_SITE_OTHER): Payer: Medicaid Other | Admitting: Internal Medicine

## 2016-01-11 ENCOUNTER — Encounter (INDEPENDENT_AMBULATORY_CARE_PROVIDER_SITE_OTHER): Payer: Self-pay

## 2016-01-11 ENCOUNTER — Encounter: Payer: Self-pay | Admitting: Internal Medicine

## 2016-01-11 VITALS — BP 112/75 | HR 66 | Temp 97.9°F | Ht 64.0 in | Wt 174.9 lb

## 2016-01-11 DIAGNOSIS — F17210 Nicotine dependence, cigarettes, uncomplicated: Secondary | ICD-10-CM | POA: Diagnosis not present

## 2016-01-11 DIAGNOSIS — G8929 Other chronic pain: Secondary | ICD-10-CM | POA: Diagnosis present

## 2016-01-11 DIAGNOSIS — Z79899 Other long term (current) drug therapy: Secondary | ICD-10-CM | POA: Diagnosis not present

## 2016-01-11 DIAGNOSIS — H00011 Hordeolum externum right upper eyelid: Secondary | ICD-10-CM

## 2016-01-11 DIAGNOSIS — I10 Essential (primary) hypertension: Secondary | ICD-10-CM | POA: Diagnosis not present

## 2016-01-11 DIAGNOSIS — M545 Low back pain: Secondary | ICD-10-CM | POA: Diagnosis not present

## 2016-01-11 DIAGNOSIS — H00019 Hordeolum externum unspecified eye, unspecified eyelid: Secondary | ICD-10-CM | POA: Insufficient documentation

## 2016-01-11 DIAGNOSIS — M549 Dorsalgia, unspecified: Secondary | ICD-10-CM

## 2016-01-11 DIAGNOSIS — Z79891 Long term (current) use of opiate analgesic: Secondary | ICD-10-CM | POA: Diagnosis not present

## 2016-01-11 MED ORDER — HYDROCODONE-ACETAMINOPHEN 10-325 MG PO TABS: 1.0000 | ORAL_TABLET | Freq: Four times a day (QID) | ORAL | 0 refills | Status: DC | PRN

## 2016-01-11 NOTE — Assessment & Plan Note (Signed)
on norco 10-325mg  q6hrp rn #100 tabs monthly. Will refill for 3 months. Should last until March 20th, 2017.  Follow up with the ortho surgery clinic for steroid injection in 2 days. F/up with me in 3 months.

## 2016-01-11 NOTE — Patient Instructions (Signed)
Refilled pain meds for 3 months. Should last until March 20th, 2017.  Follow up in 3 months.

## 2016-01-11 NOTE — Assessment & Plan Note (Signed)
Vitals:   01/11/16 1318  BP: 112/75  Pulse: 66  Temp: 97.9 F (36.6 C)   BP doing well on losartan 50mg  daily. Cont this.

## 2016-01-11 NOTE — Progress Notes (Signed)
   CC: chronic back pain  HPI:  Mr.Vernon Carpenter is a 55 y.o.  With pmh as listed below is here for follow up for his chronic pain.   Past Medical History:  Diagnosis Date  . Acute and chronic respiratory failure   . Anxiety   . Arthritis    "back" (05/15/2013)  . Asthma   . CHEST PAIN    12/2011 seen by Dr. Johnsie Cancel and cleared for hernia repair surgery  . Chronic lower back pain   . Complete rupture of left rotator cuff 03/21/2014  . COPD (chronic obstructive pulmonary disease) ([place])   . Cough   . Depression   . GERD (gastroesophageal reflux disease)   . High cholesterol   . Hypertension   . Neck pain   . Plantar fasciitis of left foot   . Umbilical hernia   . Wears dentures    top     Back pain -on norco 10-325mg  q6hrp rn #100 tabs monthly. Saw Dr. Cyndy Freeze, reviewed images and recommended further injections before doing any surgeries. Scheduled to have another another spinal steroid injection day after tomorrow. I gave him pain meds for 3 months on 10/19/15 which should last until 01/18/16. No problem with pain meds. Has 8/10 pain today on right lower back.   HTN - losartan 50mg  daily. BP 112/75 on this.  Right eye lid irritation for few days. Has been touching his eye with his fingers and also wiping with the sleeve of his shirts. No vision changes, no pain with eye movement, or any discharge.    Review of Systems:   Review of Systems  Constitutional: Negative for chills and fever.  Cardiovascular: Negative for chest pain and palpitations.  Genitourinary: Negative for dysuria.  Musculoskeletal: Positive for back pain.  Neurological: Negative for weakness.     Physical Exam:  Vitals:   01/11/16 1318  BP: 112/75  Pulse: 66  Temp: 97.9 F (36.6 C)  TempSrc: Oral  SpO2: 95%  Weight: 174 lb 14.4 oz (79.3 kg)  Height: 5\' 4"  (1.626 m)   Physical Exam  Constitutional: He is oriented to person, place, and time. He appears well-developed and well-nourished. No  distress.  HENT:  Head: Normocephalic and atraumatic.  Eyes:  PERRLA. EOMI. No pain with eye movement. Small round area of induration on right upper eye lid. No conjunctival injection, no drainage.   Neck: Normal range of motion.  Cardiovascular: Exam reveals no gallop and no friction rub.   No murmur heard. Respiratory: Effort normal and breath sounds normal.  Musculoskeletal:  Paraspinal muscle tenderness over right lower back.   Neurological: He is alert and oriented to person, place, and time.  Skin: He is not diaphoretic.    Assessment & Plan:   See Encounters Tab for problem based charting.  Patient discussed with Dr. Lynnae January

## 2016-01-11 NOTE — Assessment & Plan Note (Signed)
Has mild stye on right upper eye lid. No drainage seen.  Recommended warm compresses.

## 2016-01-12 MED FILL — GABAPENTIN 300 MG CAPSULE: 300 | 30 days supply | Qty: 90 | Fill #2

## 2016-01-13 NOTE — Progress Notes (Signed)
Internal Medicine Clinic Attending  Case discussed with Dr. Ahmed at the time of the visit.  We reviewed the resident's history and exam and pertinent patient test results.  I agree with the assessment, diagnosis, and plan of care documented in the resident's note. 

## 2016-01-19 MED FILL — PANTOPRAZOLE SOD DR 40 MG T: 40 | 30 days supply | Qty: 30 | Fill #3

## 2016-01-19 MED FILL — LOVASTATIN 20 MG TABLET: 20 | 30 days supply | Qty: 30 | Fill #3

## 2016-01-20 MED FILL — HYDROCODON-APAP 10-325: 10-325 | 25 days supply | Qty: 100 | Fill #0

## 2016-02-02 MED FILL — MONTELUKAST SOD 10 MG TAB: 10 | 30 days supply | Qty: 30 | Fill #10

## 2016-02-02 MED FILL — LOSARTAN POTASSIUM 50 MG TA: 50 | 30 days supply | Qty: 15 | Fill #2

## 2016-02-04 ENCOUNTER — Encounter: Payer: Self-pay | Admitting: Internal Medicine

## 2016-02-04 DIAGNOSIS — Z79891 Long term (current) use of opiate analgesic: Secondary | ICD-10-CM | POA: Insufficient documentation

## 2016-02-11 MED FILL — GABAPENTIN 300 MG CAPSULE: 300 | 30 days supply | Qty: 90 | Fill #3

## 2016-02-19 MED FILL — HYDROCODON-APAP 10-325: 10-325 | 30 days supply | Qty: 100 | Fill #0

## 2016-02-22 MED FILL — LOVASTATIN 20 MG TABLET: 20 | 30 days supply | Qty: 30 | Fill #4

## 2016-02-22 MED FILL — PANTOPRAZOLE SOD DR 40 MG T: 40 | 30 days supply | Qty: 30 | Fill #4

## 2016-03-04 MED FILL — MONTELUKAST SOD 10 MG TAB: 10 | 30 days supply | Qty: 30 | Fill #11

## 2016-03-04 MED FILL — LOSARTAN POTASSIUM 50 MG TA: 50 | 30 days supply | Qty: 15 | Fill #3

## 2016-03-08 ENCOUNTER — Encounter: Payer: Self-pay | Admitting: Internal Medicine

## 2016-03-08 ENCOUNTER — Other Ambulatory Visit: Payer: Medicaid Other

## 2016-03-08 ENCOUNTER — Ambulatory Visit (INDEPENDENT_AMBULATORY_CARE_PROVIDER_SITE_OTHER): Payer: Medicare Other | Admitting: Internal Medicine

## 2016-03-08 VITALS — BP 100/64 | HR 90 | Ht 64.0 in | Wt 169.0 lb

## 2016-03-08 DIAGNOSIS — R9389 Abnormal findings on diagnostic imaging of other specified body structures: Secondary | ICD-10-CM | POA: Insufficient documentation

## 2016-03-08 DIAGNOSIS — R938 Abnormal findings on diagnostic imaging of other specified body structures: Secondary | ICD-10-CM

## 2016-03-08 DIAGNOSIS — J432 Centrilobular emphysema: Secondary | ICD-10-CM

## 2016-03-08 DIAGNOSIS — R0689 Other abnormalities of breathing: Secondary | ICD-10-CM

## 2016-03-08 DIAGNOSIS — R0600 Dyspnea, unspecified: Secondary | ICD-10-CM | POA: Diagnosis not present

## 2016-03-08 DIAGNOSIS — Z87891 Personal history of nicotine dependence: Secondary | ICD-10-CM
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
## 2016-03-08 NOTE — Patient Instructions (Addendum)
ICD-9-CM ICD-10-CM   1. Dyspnea and respiratory abnormality 786.09 R06.00     R06.89   2. Centrilobular emphysema (HCC) 492.8 J43.2   3. Smoking history V15.82 Z87.891   4. Abnormal CT of the chest 793.2 R93.8    You have emphysema - stable disease 03/08/2016 Rest of shortness of breath is due to pain and deconditioning  PLAN Continue spiriva respimat daily  Continue dulera daily  Continue singulair Check alpha 1 03/08/2016\  Followup Nov  2018 - CT chest wo contrast for abnormal debris in CT Nov 2017 Return to see me Nov 2018

## 2016-03-12 LAB — ALPHA-1 ANTITRYPSIN PHENOTYPE: A1 ANTITRYPSIN: 142 mg/dL (ref 83–199)

## 2016-03-14 MED FILL — GABAPENTIN 300 MG CAPSULE: 300 | 30 days supply | Qty: 90 | Fill #4

## 2016-03-18 NOTE — Progress Notes (Signed)
Spoke with patient and informed him of results. Pt did not have any additional questions. Nothing further is needed.

## 2016-03-21 MED FILL — HYDROCODON-APAP 10-325: 10-325 | 30 days supply | Qty: 100 | Fill #0

## 2016-03-23 MED FILL — LOVASTATIN 20 MG TABLET: 20 | 30 days supply | Qty: 30 | Fill #5

## 2016-03-23 MED FILL — PANTOPRAZOLE SOD DR 40 MG T: 40 | 30 days supply | Qty: 30 | Fill #5

## 2016-04-05 ENCOUNTER — Encounter: Payer: Self-pay | Admitting: Internal Medicine

## 2016-04-06 MED FILL — LOSARTAN POTASSIUM 50 MG TA: 50 | 30 days supply | Qty: 15 | Fill #4

## 2016-04-12 ENCOUNTER — Other Ambulatory Visit: Payer: Self-pay | Admitting: Internal Medicine

## 2016-04-12 DIAGNOSIS — M549 Dorsalgia, unspecified: Secondary | ICD-10-CM

## 2016-04-12 MED FILL — MONTELUKAST SOD 10 MG TAB: 10 | 30 days supply | Qty: 30 | Fill #0

## 2016-04-12 MED FILL — GABAPENTIN 300 MG CAPSULE: 300 | 30 days supply | Qty: 90 | Fill #5

## 2016-04-12 NOTE — Telephone Encounter (Signed)
Pain med refill °

## 2016-04-13 ENCOUNTER — Other Ambulatory Visit: Payer: Self-pay | Admitting: Internal Medicine

## 2016-04-13 DIAGNOSIS — M549 Dorsalgia, unspecified: Secondary | ICD-10-CM

## 2016-04-13 MED ORDER — HYDROCODONE-ACETAMINOPHEN 10-325 MG PO TABS: 1.0000 | ORAL_TABLET | Freq: Four times a day (QID) | ORAL | 0 refills | Status: DC | PRN

## 2016-04-13 MED ORDER — HYDROCODONE-ACETAMINOPHEN 10-325 MG PO TABS: 1.0000 | ORAL_TABLET | Freq: Four times a day (QID) | ORAL | 0 refills | Status: AC | PRN

## 2016-04-13 NOTE — Progress Notes (Signed)
Refilled hydrocodone #100/month for total 3 months. This should last until at least June 20th, 2018. He should see me prior to running out.

## 2016-04-14 NOTE — Telephone Encounter (Signed)
Pt.notified

## 2016-04-14 NOTE — Telephone Encounter (Signed)
Pt signed out prescriptions x 3 today.Despina Hidden Cassady3/15/20182:57 PM

## 2016-04-20 MED FILL — HYDROCODON-APAP 10-325: 10-325 | 30 days supply | Qty: 100 | Fill #0

## 2016-04-22 MED FILL — LOVASTATIN 20 MG TABLET: 20 | 30 days supply | Qty: 30 | Fill #6

## 2016-04-22 MED FILL — PANTOPRAZOLE SOD DR 40 MG T: 40 | 30 days supply | Qty: 30 | Fill #6

## 2016-05-06 ENCOUNTER — Other Ambulatory Visit: Payer: Self-pay | Admitting: Internal Medicine

## 2016-05-06 NOTE — Telephone Encounter (Signed)
Call made to new pharmacy-they were given info to contact [facility] to have rxs transferred.  Pt was contacted to let him know and when asked was in [place] visiting or to stay-he answered "to stay".  Pt may need refills while transitioning to a new pcp.Marland KitchenMarland KitchenDespina Hidden Cassady4/6/20184:36 PM

## 2016-05-06 NOTE — Telephone Encounter (Signed)
Pt has now moved to [place] and the patient states he is completely out of his losartan (COZAAR) 50 MG tablet  Medications.  He also needs all of his medications to be transferred to [facility] [address],   [place], [place] phone [phone] fax [phone].  He phone number is the same in the chart.

## 2016-06-06 ENCOUNTER — Encounter: Payer: Medicaid Other | Admitting: Internal Medicine

## 2016-07-07 ENCOUNTER — Encounter: Payer: Self-pay | Admitting: *Deleted

## 2016-09-26 ENCOUNTER — Encounter: Payer: Medicare Other | Admitting: Internal Medicine

## 2016-12-05 ENCOUNTER — Ambulatory Visit: Payer: Medicaid Other | Admitting: Internal Medicine

## 2016-12-30 ENCOUNTER — Inpatient Hospital Stay: Admission: RE | Admit: 2016-12-30 | Payer: Medicaid Other | Source: Ambulatory Visit

## 2017-05-06 IMAGING — CT CT CHEST HIGH RESOLUTION W/O CM
2 of 6 series · 15 of 36 positions shown, 18 images · non-contrast
Comparison: [DATE] Coronary CT.  10/11/2013 chest radiograph.

CLINICAL DATA: Shortness of breath on exertion. Wheezing. COPD.
Active smoker.

EXAM:
CT CHEST WITHOUT CONTRAST
TECHNIQUE: Multidetector CT imaging of the chest was performed following the
standard protocol without intravenous contrast. High resolution
imaging of the lungs, as well as inspiratory and expiratory imaging,
was performed.

[Series 2: chest routine with · axial · 0.68mm/px · z∈[-324,-34]mm · 12 of 68 slices shown, 15 images]
[im 5/68  mediastinal]
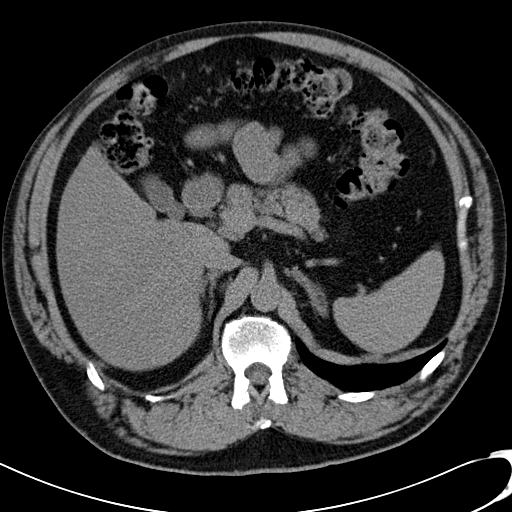
[im 5/68  lung]
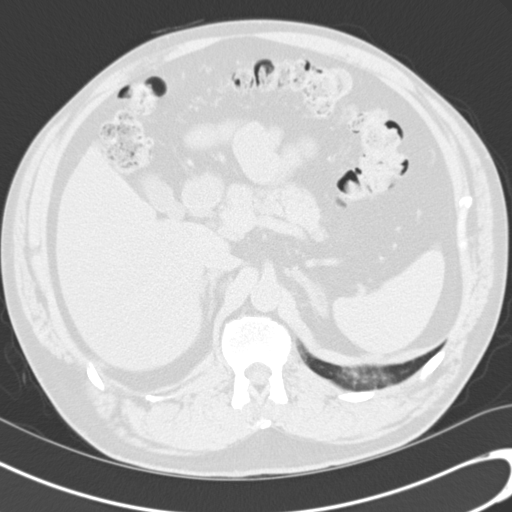
[im 10/68  lung]
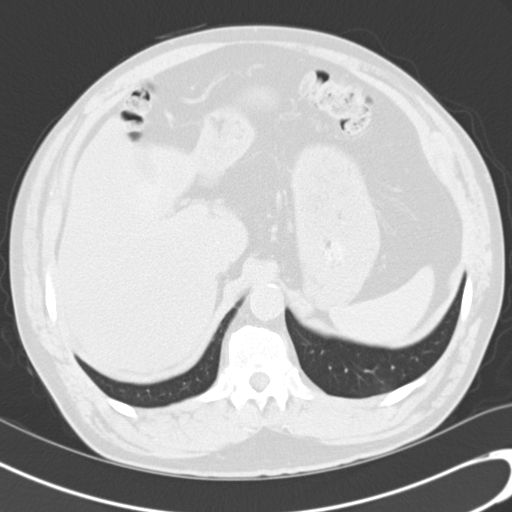
[im 15/68  lung]
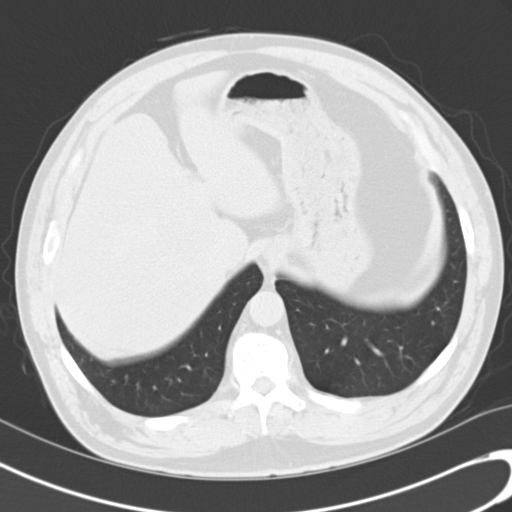
[im 20/68  lung]
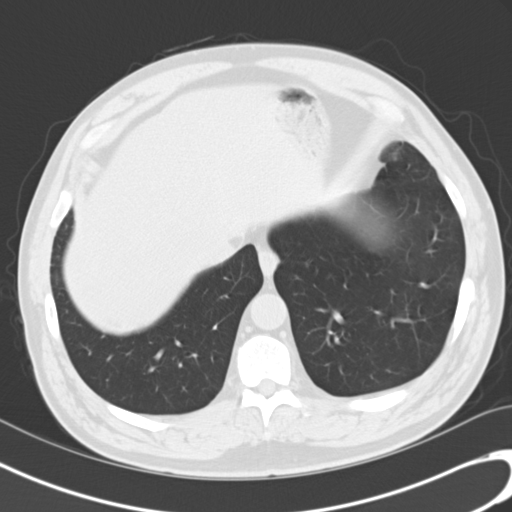
[im 24/68  mediastinal]
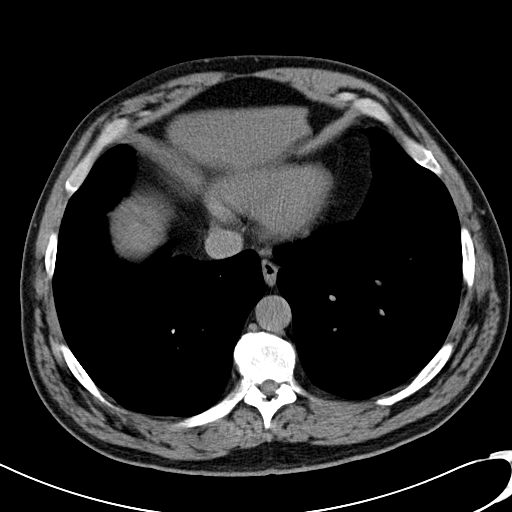
[im 24/68  lung]
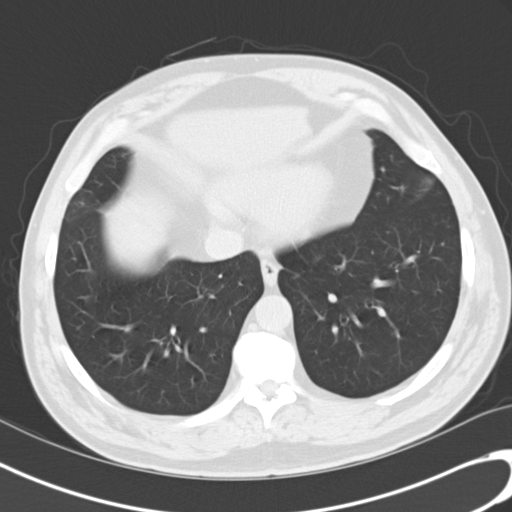
[im 29/68  lung]
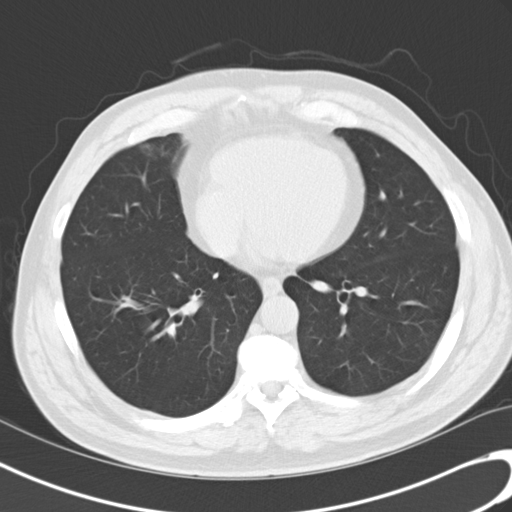
[im 39/68  lung]
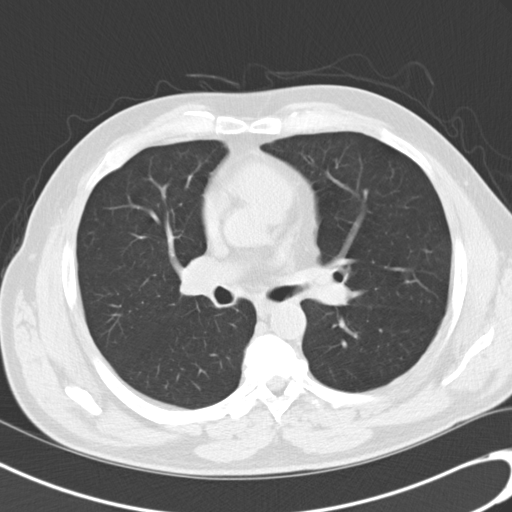
[im 44/68  lung]
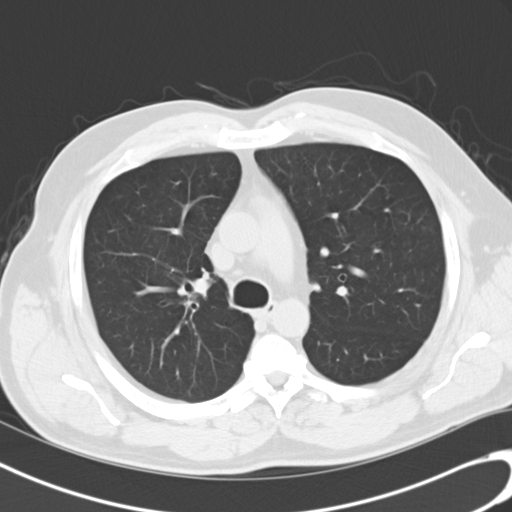
[im 48/68  mediastinal]
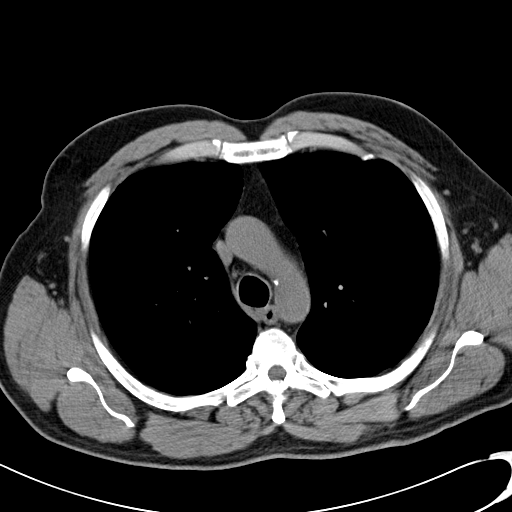
[im 48/68  lung]
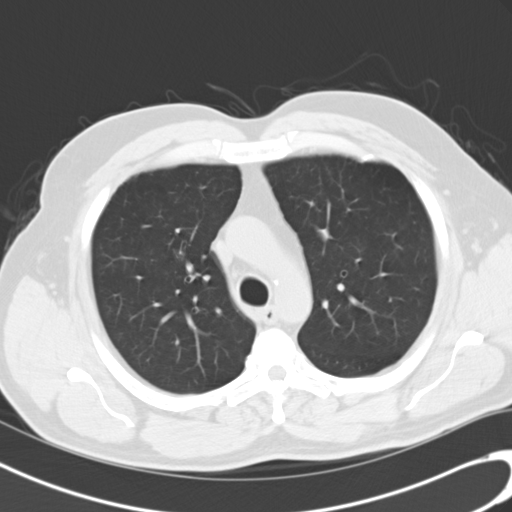
[im 53/68  lung]
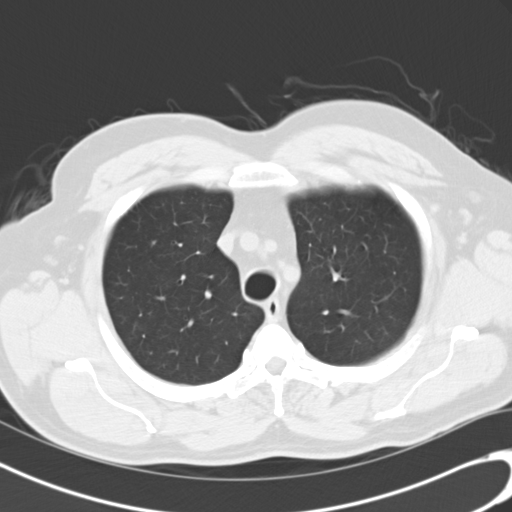
[im 58/68  lung]
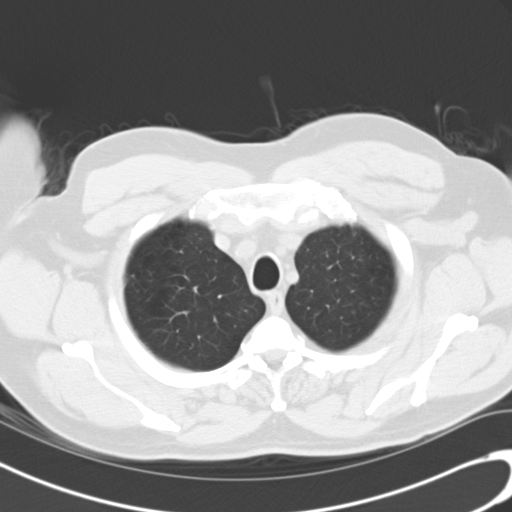
[im 63/68  lung]
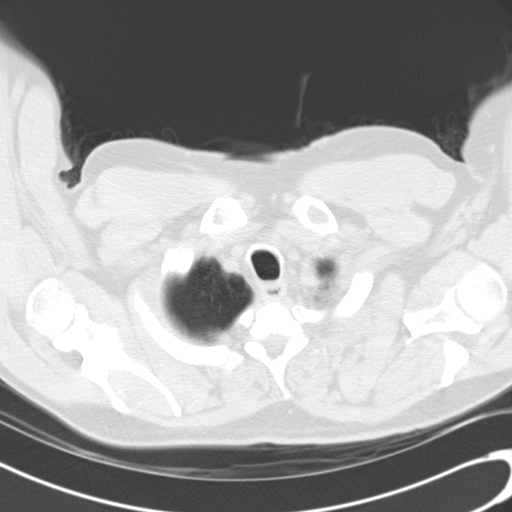

[Series 602: cor · coronal · 0.68mm/px · 3 of 123 slices shown]
[im 25/123  lung]
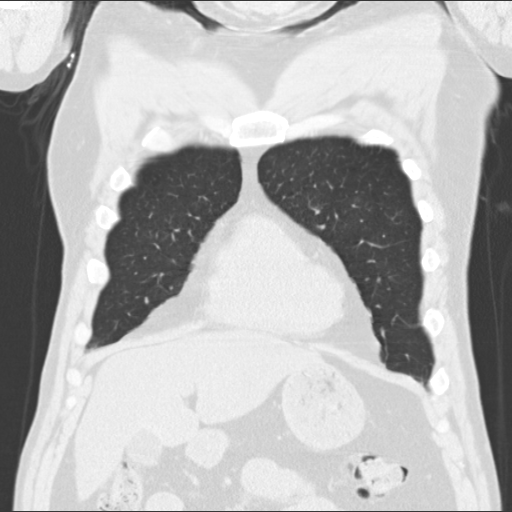
[im 49/123  lung]
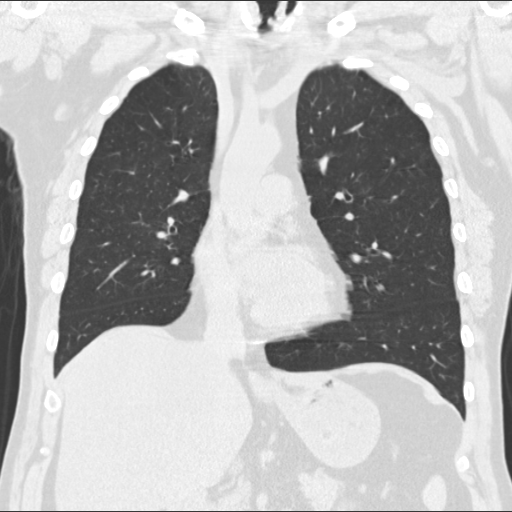
[im 74/123  lung]
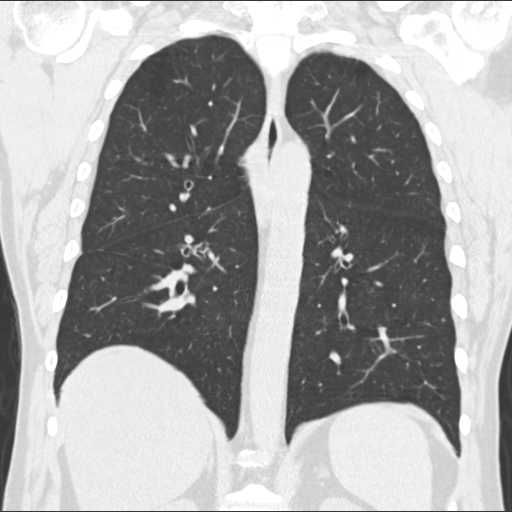

[15 of 36 positions shown; findings below may reference images not displayed]

FINDINGS: Mediastinum/Nodes: Normal heart size. No pericardial
fluid/thickening. Great vessels are normal in course and caliber.
Normal visualized thyroid. Normal esophagus. No pathologically
enlarged axillary, mediastinal or gross hilar lymph nodes, noting
limited sensitivity for the detection of hilar adenopathy on this
noncontrast study.

Lungs/Pleura: No pneumothorax. No pleural effusion. No acute
consolidative airspace disease, significant pulmonary nodules or
lung masses. There is faint ground-glass centrilobular nodularity
throughout both lungs predominantly involving the upper lobes and
superior segments of the lower lobes. There are minimal scattered
regions of subpleural reticulation in the upper lobes. No
significant regions of traction bronchiectasis, architectural
distortion, parenchymal banding or frank honeycombing. No
appreciable centrilobular or paraseptal emphysema. Mild diffuse
bronchial wall thickening. No evidence of air trapping on the
expiration sequence .

Upper abdomen: Diffuse hepatic steatosis.

Musculoskeletal: No aggressive appearing focal osseous lesions.
Healed deformities in the left posterior ninth and eleventh ribs.
Partially visualized surgical hardware from ACDF in the lower
cervical spine. Mild degenerative changes in the thoracic spine.
IMPRESSION: 1. Faint ground-glass centrilobular nodularity in both lungs
predominantly involving the upper lobes and superior segments of the
lower lobes. Minimal scattered regions of subpleural reticulation in
the upper lobes. No significant traction bronchiectasis or frank
honeycombing. In a symptomatic active smoker, these findings are
most in keeping with respiratory bronchiolitis-interstitial lung
disease (ROSS -ILD).
2. No appreciable emphysema. Mild diffuse bronchial wall thickening,
suggesting chronic bronchitis.

## 2022-12-22 ENCOUNTER — Encounter: Payer: Self-pay | Admitting: Internal Medicine
